# Patient Record
Sex: Male | Born: 1940 | Race: White | Hispanic: No | Smoking: Former smoker
Health system: Southern US, Community
[De-identification: ages and names within clinical notes are randomized; demographics above are authoritative.]

## PROBLEM LIST (undated history)

## (undated) DIAGNOSIS — R451 Restlessness and agitation: Secondary | ICD-10-CM

## (undated) DIAGNOSIS — E785 Hyperlipidemia, unspecified: Secondary | ICD-10-CM

## (undated) DIAGNOSIS — K759 Inflammatory liver disease, unspecified: Secondary | ICD-10-CM

## (undated) DIAGNOSIS — K449 Diaphragmatic hernia without obstruction or gangrene: Secondary | ICD-10-CM

## (undated) DIAGNOSIS — I4891 Unspecified atrial fibrillation: Secondary | ICD-10-CM

## (undated) DIAGNOSIS — R942 Abnormal results of pulmonary function studies: Secondary | ICD-10-CM

## (undated) DIAGNOSIS — I10 Essential (primary) hypertension: Secondary | ICD-10-CM

## (undated) DIAGNOSIS — R0600 Dyspnea, unspecified: Secondary | ICD-10-CM

## (undated) DIAGNOSIS — R918 Other nonspecific abnormal finding of lung field: Secondary | ICD-10-CM

## (undated) DIAGNOSIS — J8409 Other alveolar and parieto-alveolar conditions: Secondary | ICD-10-CM

## (undated) DIAGNOSIS — Z8619 Personal history of other infectious and parasitic diseases: Secondary | ICD-10-CM

## (undated) DIAGNOSIS — I2510 Atherosclerotic heart disease of native coronary artery without angina pectoris: Secondary | ICD-10-CM

## (undated) DIAGNOSIS — I639 Cerebral infarction, unspecified: Secondary | ICD-10-CM

## (undated) DIAGNOSIS — R569 Unspecified convulsions: Secondary | ICD-10-CM

## (undated) HISTORY — DX: Cerebral infarction, unspecified: I63.9

## (undated) HISTORY — DX: Restlessness and agitation: R45.1

## (undated) HISTORY — DX: Diaphragmatic hernia without obstruction or gangrene: K44.9

## (undated) HISTORY — DX: Personal history of other infectious and parasitic diseases: Z86.19

## (undated) HISTORY — DX: Dyspnea, unspecified: R06.00

## (undated) HISTORY — DX: Essential (primary) hypertension: I10

## (undated) HISTORY — DX: Unspecified atrial fibrillation: I48.91

## (undated) HISTORY — DX: Other alveolar and parieto-alveolar conditions: J84.09

## (undated) HISTORY — DX: Unspecified convulsions: R56.9

## (undated) HISTORY — DX: Other nonspecific abnormal finding of lung field: R91.8

## (undated) HISTORY — DX: Atherosclerotic heart disease of native coronary artery without angina pectoris: I25.10

## (undated) HISTORY — DX: Abnormal results of pulmonary function studies: R94.2

## (undated) HISTORY — DX: Inflammatory liver disease, unspecified: K75.9

## (undated) HISTORY — DX: Hyperlipidemia, unspecified: E78.5

## (undated) HISTORY — PX: APPENDECTOMY: SHX54

---

## 2005-07-06 DIAGNOSIS — I639 Cerebral infarction, unspecified: Secondary | ICD-10-CM

## 2005-07-06 HISTORY — DX: Cerebral infarction, unspecified: I63.9

## 2006-07-04 ENCOUNTER — Inpatient Hospital Stay (HOSPITAL_COMMUNITY): Admission: EM | Admit: 2006-07-04 | Discharge: 2006-07-12 | Payer: Self-pay | Admitting: Emergency Medicine

## 2006-07-04 ENCOUNTER — Ambulatory Visit: Payer: Self-pay | Admitting: Cardiology

## 2006-07-05 ENCOUNTER — Encounter (INDEPENDENT_AMBULATORY_CARE_PROVIDER_SITE_OTHER): Payer: Self-pay | Admitting: Neurology

## 2006-07-05 ENCOUNTER — Encounter: Payer: Self-pay | Admitting: Cardiology

## 2006-07-07 ENCOUNTER — Ambulatory Visit: Payer: Self-pay | Admitting: Physical Medicine & Rehabilitation

## 2006-07-07 ENCOUNTER — Encounter: Payer: Self-pay | Admitting: Cardiology

## 2006-07-12 ENCOUNTER — Inpatient Hospital Stay (HOSPITAL_COMMUNITY)
Admission: RE | Admit: 2006-07-12 | Discharge: 2006-07-29 | Payer: Self-pay | Admitting: Physical Medicine & Rehabilitation

## 2006-08-19 ENCOUNTER — Ambulatory Visit: Payer: Self-pay | Admitting: Cardiology

## 2006-08-26 ENCOUNTER — Ambulatory Visit: Payer: Self-pay

## 2006-09-01 ENCOUNTER — Encounter: Payer: Self-pay | Admitting: Physical Medicine & Rehabilitation

## 2006-09-03 ENCOUNTER — Encounter
Admission: RE | Admit: 2006-09-03 | Discharge: 2006-12-02 | Payer: Self-pay | Admitting: Physical Medicine & Rehabilitation

## 2006-09-03 ENCOUNTER — Ambulatory Visit: Payer: Self-pay | Admitting: Physical Medicine & Rehabilitation

## 2006-09-04 ENCOUNTER — Encounter: Payer: Self-pay | Admitting: Physical Medicine & Rehabilitation

## 2006-10-05 ENCOUNTER — Encounter: Payer: Self-pay | Admitting: Physical Medicine & Rehabilitation

## 2006-10-07 ENCOUNTER — Ambulatory Visit: Payer: Self-pay | Admitting: Cardiology

## 2006-10-31 ENCOUNTER — Emergency Department (HOSPITAL_COMMUNITY): Admission: EM | Admit: 2006-10-31 | Discharge: 2006-10-31 | Payer: Self-pay | Admitting: *Deleted

## 2006-11-02 ENCOUNTER — Encounter: Payer: Self-pay | Admitting: Cardiology

## 2006-11-02 ENCOUNTER — Ambulatory Visit: Payer: Self-pay | Admitting: Cardiology

## 2006-11-04 ENCOUNTER — Encounter: Payer: Self-pay | Admitting: Physical Medicine & Rehabilitation

## 2006-12-03 ENCOUNTER — Encounter
Admission: RE | Admit: 2006-12-03 | Discharge: 2007-03-03 | Payer: Self-pay | Admitting: Physical Medicine & Rehabilitation

## 2006-12-03 ENCOUNTER — Ambulatory Visit: Payer: Self-pay | Admitting: Physical Medicine & Rehabilitation

## 2006-12-05 ENCOUNTER — Encounter: Payer: Self-pay | Admitting: Physical Medicine & Rehabilitation

## 2007-01-04 ENCOUNTER — Encounter: Payer: Self-pay | Admitting: Physical Medicine & Rehabilitation

## 2007-01-05 ENCOUNTER — Emergency Department: Payer: Self-pay | Admitting: Emergency Medicine

## 2007-02-04 ENCOUNTER — Encounter: Payer: Self-pay | Admitting: Physical Medicine & Rehabilitation

## 2007-02-22 ENCOUNTER — Emergency Department (HOSPITAL_COMMUNITY): Admission: EM | Admit: 2007-02-22 | Discharge: 2007-02-22 | Payer: Self-pay | Admitting: Emergency Medicine

## 2007-03-04 ENCOUNTER — Encounter
Admission: RE | Admit: 2007-03-04 | Discharge: 2007-03-08 | Payer: Self-pay | Admitting: Physical Medicine & Rehabilitation

## 2007-03-04 ENCOUNTER — Ambulatory Visit: Payer: Self-pay | Admitting: Physical Medicine & Rehabilitation

## 2007-03-07 ENCOUNTER — Encounter: Payer: Self-pay | Admitting: Physical Medicine & Rehabilitation

## 2007-04-06 ENCOUNTER — Encounter: Payer: Self-pay | Admitting: Physical Medicine & Rehabilitation

## 2007-04-13 ENCOUNTER — Observation Stay (HOSPITAL_COMMUNITY): Admission: EM | Admit: 2007-04-13 | Discharge: 2007-04-14 | Payer: Self-pay | Admitting: Emergency Medicine

## 2007-04-13 ENCOUNTER — Ambulatory Visit: Payer: Self-pay | Admitting: Cardiovascular Disease

## 2007-05-06 ENCOUNTER — Ambulatory Visit: Payer: Self-pay | Admitting: Cardiology

## 2007-08-23 ENCOUNTER — Encounter
Admission: RE | Admit: 2007-08-23 | Discharge: 2007-08-24 | Payer: Self-pay | Admitting: Physical Medicine & Rehabilitation

## 2007-08-23 ENCOUNTER — Ambulatory Visit: Payer: Self-pay | Admitting: Physical Medicine & Rehabilitation

## 2007-09-28 ENCOUNTER — Emergency Department (HOSPITAL_COMMUNITY): Admission: EM | Admit: 2007-09-28 | Discharge: 2007-09-28 | Payer: Self-pay | Admitting: Emergency Medicine

## 2007-12-23 ENCOUNTER — Ambulatory Visit: Payer: Self-pay | Admitting: Cardiology

## 2008-06-30 ENCOUNTER — Emergency Department (HOSPITAL_COMMUNITY): Admission: EM | Admit: 2008-06-30 | Discharge: 2008-07-01 | Payer: Self-pay | Admitting: Emergency Medicine

## 2008-07-14 ENCOUNTER — Emergency Department: Payer: Medicare Other | Admitting: Emergency Medicine

## 2008-08-30 ENCOUNTER — Emergency Department (HOSPITAL_COMMUNITY): Admission: EM | Admit: 2008-08-30 | Discharge: 2008-08-30 | Payer: Self-pay | Admitting: Emergency Medicine

## 2008-09-03 DIAGNOSIS — R918 Other nonspecific abnormal finding of lung field: Secondary | ICD-10-CM

## 2008-09-03 DIAGNOSIS — I2510 Atherosclerotic heart disease of native coronary artery without angina pectoris: Secondary | ICD-10-CM

## 2008-09-03 DIAGNOSIS — R0600 Dyspnea, unspecified: Secondary | ICD-10-CM

## 2008-09-03 HISTORY — PX: OTHER SURGICAL HISTORY: SHX169

## 2008-09-03 HISTORY — DX: Atherosclerotic heart disease of native coronary artery without angina pectoris: I25.10

## 2008-09-03 HISTORY — DX: Dyspnea, unspecified: R06.00

## 2008-09-03 HISTORY — DX: Other nonspecific abnormal finding of lung field: R91.8

## 2008-09-09 ENCOUNTER — Emergency Department (HOSPITAL_COMMUNITY): Admission: EM | Admit: 2008-09-09 | Discharge: 2008-09-10 | Payer: Self-pay | Admitting: Emergency Medicine

## 2008-09-10 ENCOUNTER — Encounter: Payer: Self-pay | Admitting: Cardiology

## 2008-09-10 ENCOUNTER — Encounter: Payer: Self-pay | Admitting: Family Medicine

## 2008-09-10 DIAGNOSIS — I4891 Unspecified atrial fibrillation: Secondary | ICD-10-CM | POA: Insufficient documentation

## 2008-09-10 DIAGNOSIS — I10 Essential (primary) hypertension: Secondary | ICD-10-CM

## 2008-09-10 DIAGNOSIS — I6350 Cerebral infarction due to unspecified occlusion or stenosis of unspecified cerebral artery: Secondary | ICD-10-CM

## 2008-09-11 ENCOUNTER — Encounter: Payer: Self-pay | Admitting: Cardiology

## 2008-09-11 ENCOUNTER — Ambulatory Visit: Payer: Self-pay | Admitting: Cardiology

## 2008-09-11 DIAGNOSIS — R0602 Shortness of breath: Secondary | ICD-10-CM

## 2008-09-11 DIAGNOSIS — E7800 Pure hypercholesterolemia, unspecified: Secondary | ICD-10-CM

## 2008-09-11 LAB — CONVERTED CEMR LAB
CO2: 32 meq/L (ref 19–32)
Chloride: 99 meq/L (ref 96–112)
GFR calc non Af Amer: 54 mL/min
Potassium: 3.6 meq/L (ref 3.5–5.1)
Pro B Natriuretic peptide (BNP): 35 pg/mL (ref 0.0–100.0)

## 2008-09-18 ENCOUNTER — Ambulatory Visit: Payer: Self-pay

## 2008-09-18 ENCOUNTER — Encounter: Payer: Self-pay | Admitting: Cardiology

## 2008-09-24 ENCOUNTER — Ambulatory Visit: Payer: Self-pay | Admitting: Pulmonary Disease

## 2008-09-24 ENCOUNTER — Ambulatory Visit: Payer: Self-pay | Admitting: Internal Medicine

## 2008-09-24 ENCOUNTER — Inpatient Hospital Stay (HOSPITAL_COMMUNITY): Admission: EM | Admit: 2008-09-24 | Discharge: 2008-09-29 | Payer: Self-pay | Admitting: Emergency Medicine

## 2008-09-25 DIAGNOSIS — R079 Chest pain, unspecified: Secondary | ICD-10-CM | POA: Insufficient documentation

## 2008-10-01 ENCOUNTER — Ambulatory Visit: Payer: Self-pay | Admitting: Cardiovascular Disease

## 2008-10-08 ENCOUNTER — Ambulatory Visit: Payer: Self-pay | Admitting: Internal Medicine

## 2008-10-23 ENCOUNTER — Encounter: Payer: Self-pay | Admitting: Cardiology

## 2008-10-23 ENCOUNTER — Ambulatory Visit: Payer: Self-pay | Admitting: Cardiology

## 2008-10-23 LAB — CONVERTED CEMR LAB
Albumin: 3.6 g/dL (ref 3.5–5.2)
Alkaline Phosphatase: 75 units/L (ref 39–117)
Chloride: 105 meq/L (ref 96–112)
Cholesterol: 132 mg/dL (ref 0–200)
HDL: 30.3 mg/dL — ABNORMAL LOW (ref >39.00)
LDL Cholesterol: 76 mg/dL (ref 0–99)
Potassium: 4.1 meq/L (ref 3.5–5.1)
Sodium: 141 meq/L (ref 135–145)
Total Protein: 7.1 g/dL (ref 6.0–8.3)
Triglycerides: 127 mg/dL (ref 0.0–149.0)
VLDL: 25.4 mg/dL (ref 0.0–40.0)

## 2008-10-30 ENCOUNTER — Encounter: Payer: Self-pay | Admitting: Cardiovascular Disease

## 2008-10-30 ENCOUNTER — Ambulatory Visit: Payer: Self-pay

## 2008-11-22 ENCOUNTER — Encounter: Payer: Self-pay | Admitting: Cardiology

## 2008-12-04 DIAGNOSIS — R569 Unspecified convulsions: Secondary | ICD-10-CM

## 2008-12-04 HISTORY — DX: Unspecified convulsions: R56.9

## 2009-01-01 ENCOUNTER — Emergency Department (HOSPITAL_COMMUNITY): Admission: EM | Admit: 2009-01-01 | Discharge: 2009-01-01 | Payer: Self-pay | Admitting: Emergency Medicine

## 2009-01-03 ENCOUNTER — Emergency Department (HOSPITAL_COMMUNITY): Admission: EM | Admit: 2009-01-03 | Discharge: 2009-01-04 | Payer: Self-pay | Admitting: Emergency Medicine

## 2009-01-08 ENCOUNTER — Emergency Department (HOSPITAL_COMMUNITY): Admission: EM | Admit: 2009-01-08 | Discharge: 2009-01-08 | Payer: Self-pay | Admitting: Emergency Medicine

## 2009-01-17 ENCOUNTER — Encounter: Payer: Self-pay | Admitting: Cardiology

## 2009-01-31 ENCOUNTER — Encounter: Payer: Self-pay | Admitting: Cardiology

## 2009-02-13 ENCOUNTER — Encounter (INDEPENDENT_AMBULATORY_CARE_PROVIDER_SITE_OTHER): Payer: Self-pay | Admitting: *Deleted

## 2009-02-14 ENCOUNTER — Encounter: Payer: Self-pay | Admitting: Cardiology

## 2009-02-14 ENCOUNTER — Encounter: Payer: Self-pay | Admitting: Family Medicine

## 2009-02-18 ENCOUNTER — Encounter: Payer: Self-pay | Admitting: *Deleted

## 2009-03-14 ENCOUNTER — Encounter: Payer: Self-pay | Admitting: Cardiology

## 2009-04-11 ENCOUNTER — Encounter: Payer: Self-pay | Admitting: Cardiology

## 2009-04-29 ENCOUNTER — Encounter: Payer: Self-pay | Admitting: Cardiology

## 2009-04-30 ENCOUNTER — Ambulatory Visit: Payer: Self-pay

## 2009-04-30 ENCOUNTER — Encounter (INDEPENDENT_AMBULATORY_CARE_PROVIDER_SITE_OTHER): Payer: Self-pay | Admitting: *Deleted

## 2009-04-30 ENCOUNTER — Ambulatory Visit: Payer: Self-pay | Admitting: Cardiology

## 2009-04-30 DIAGNOSIS — I2510 Atherosclerotic heart disease of native coronary artery without angina pectoris: Secondary | ICD-10-CM | POA: Insufficient documentation

## 2009-04-30 DIAGNOSIS — R930 Abnormal findings on diagnostic imaging of skull and head, not elsewhere classified: Secondary | ICD-10-CM

## 2009-05-01 ENCOUNTER — Encounter (INDEPENDENT_AMBULATORY_CARE_PROVIDER_SITE_OTHER): Payer: Self-pay | Admitting: *Deleted

## 2009-05-01 LAB — CONVERTED CEMR LAB
AST: 13 units/L (ref 0–37)
Chloride: 100 meq/L (ref 96–112)
Cholesterol: 162 mg/dL (ref 0–200)
Creatinine, Ser: 1.4 mg/dL (ref 0.4–1.5)
LDL Cholesterol: 95 mg/dL (ref 0–99)
Pro B Natriuretic peptide (BNP): 42 pg/mL (ref 0.0–100.0)
Sodium: 141 meq/L (ref 135–145)
Total Bilirubin: 0.7 mg/dL (ref 0.3–1.2)
Triglycerides: 148 mg/dL (ref 0.0–149.0)
VLDL: 29.6 mg/dL (ref 0.0–40.0)

## 2009-05-02 ENCOUNTER — Emergency Department: Payer: Medicare Other | Admitting: Emergency Medicine

## 2009-05-08 ENCOUNTER — Ambulatory Visit: Payer: Self-pay | Admitting: Cardiology

## 2009-05-08 DIAGNOSIS — E876 Hypokalemia: Secondary | ICD-10-CM | POA: Insufficient documentation

## 2009-05-09 LAB — CONVERTED CEMR LAB
BUN: 17 mg/dL (ref 6–23)
Creatinine, Ser: 1.5 mg/dL (ref 0.4–1.5)
GFR calc non Af Amer: 49.38 mL/min (ref >60)
Potassium: 3.9 meq/L (ref 3.5–5.1)

## 2009-05-10 ENCOUNTER — Encounter: Payer: Self-pay | Admitting: Cardiology

## 2009-05-28 ENCOUNTER — Ambulatory Visit: Payer: Self-pay | Admitting: Internal Medicine

## 2009-05-28 DIAGNOSIS — J984 Other disorders of lung: Secondary | ICD-10-CM | POA: Insufficient documentation

## 2009-06-13 ENCOUNTER — Encounter: Payer: Self-pay | Admitting: Cardiology

## 2009-06-14 ENCOUNTER — Encounter (INDEPENDENT_AMBULATORY_CARE_PROVIDER_SITE_OTHER): Payer: Self-pay | Admitting: *Deleted

## 2009-06-14 ENCOUNTER — Ambulatory Visit: Payer: Self-pay | Admitting: Cardiology

## 2009-06-14 ENCOUNTER — Ambulatory Visit: Payer: Self-pay | Admitting: Internal Medicine

## 2009-06-14 DIAGNOSIS — E785 Hyperlipidemia, unspecified: Secondary | ICD-10-CM | POA: Insufficient documentation

## 2009-06-14 DIAGNOSIS — R942 Abnormal results of pulmonary function studies: Secondary | ICD-10-CM

## 2009-06-14 DIAGNOSIS — J8409 Other alveolar and parieto-alveolar conditions: Secondary | ICD-10-CM | POA: Insufficient documentation

## 2009-06-14 LAB — CONVERTED CEMR LAB
ALT: 15 units/L (ref 0–53)
AST: 19 units/L (ref 0–37)
Albumin: 3.7 g/dL (ref 3.5–5.2)
Calcium: 8.8 mg/dL (ref 8.4–10.5)
Cholesterol: 125 mg/dL (ref 0–200)
GFR calc non Af Amer: 63.87 mL/min (ref >60)
HDL: 33.9 mg/dL — ABNORMAL LOW (ref >39.00)
Sodium: 139 meq/L (ref 135–145)
Triglycerides: 143 mg/dL (ref 0.0–149.0)

## 2009-07-04 ENCOUNTER — Encounter: Payer: Self-pay | Admitting: Cardiology

## 2009-07-11 ENCOUNTER — Encounter: Payer: Self-pay | Admitting: Cardiology

## 2009-07-18 ENCOUNTER — Encounter: Payer: Self-pay | Admitting: Cardiology

## 2009-07-24 ENCOUNTER — Telehealth (INDEPENDENT_AMBULATORY_CARE_PROVIDER_SITE_OTHER): Payer: Self-pay | Admitting: *Deleted

## 2009-08-01 ENCOUNTER — Encounter (INDEPENDENT_AMBULATORY_CARE_PROVIDER_SITE_OTHER): Payer: Self-pay | Admitting: Cardiology

## 2009-08-08 ENCOUNTER — Encounter (INDEPENDENT_AMBULATORY_CARE_PROVIDER_SITE_OTHER): Payer: Self-pay | Admitting: Cardiology

## 2009-08-09 ENCOUNTER — Telehealth: Payer: Self-pay | Admitting: Cardiology

## 2009-08-15 ENCOUNTER — Encounter: Payer: Self-pay | Admitting: Cardiology

## 2009-08-20 ENCOUNTER — Encounter: Payer: Self-pay | Admitting: Internal Medicine

## 2009-08-20 LAB — CONVERTED CEMR LAB: POC INR: 2.84

## 2009-08-21 ENCOUNTER — Encounter: Payer: Self-pay | Admitting: Cardiology

## 2009-08-21 ENCOUNTER — Encounter (INDEPENDENT_AMBULATORY_CARE_PROVIDER_SITE_OTHER): Payer: Self-pay | Admitting: Cardiology

## 2009-08-23 ENCOUNTER — Ambulatory Visit (HOSPITAL_COMMUNITY): Admission: RE | Admit: 2009-08-23 | Discharge: 2009-08-23 | Payer: Self-pay | Admitting: Cardiology

## 2009-08-23 ENCOUNTER — Ambulatory Visit: Payer: Self-pay | Admitting: Cardiology

## 2009-08-29 ENCOUNTER — Encounter: Payer: Self-pay | Admitting: Cardiology

## 2009-09-03 ENCOUNTER — Ambulatory Visit: Payer: Self-pay | Admitting: Cardiology

## 2009-09-26 ENCOUNTER — Encounter: Payer: Self-pay | Admitting: Cardiology

## 2009-09-27 ENCOUNTER — Encounter: Payer: Self-pay | Admitting: Family Medicine

## 2009-10-22 ENCOUNTER — Ambulatory Visit: Payer: Self-pay | Admitting: Cardiovascular Disease

## 2009-10-22 ENCOUNTER — Ambulatory Visit: Payer: Self-pay | Admitting: Cardiology

## 2009-10-23 ENCOUNTER — Ambulatory Visit: Payer: Self-pay | Admitting: Cardiology

## 2009-10-23 ENCOUNTER — Ambulatory Visit (HOSPITAL_COMMUNITY): Admission: RE | Admit: 2009-10-23 | Discharge: 2009-10-23 | Payer: Self-pay | Admitting: Cardiology

## 2009-10-29 ENCOUNTER — Ambulatory Visit: Payer: Self-pay | Admitting: Internal Medicine

## 2009-11-21 ENCOUNTER — Ambulatory Visit: Payer: Self-pay | Admitting: Internal Medicine

## 2009-11-21 ENCOUNTER — Encounter: Payer: Self-pay | Admitting: Internal Medicine

## 2009-11-21 ENCOUNTER — Inpatient Hospital Stay (HOSPITAL_COMMUNITY): Admission: EM | Admit: 2009-11-21 | Discharge: 2009-11-28 | Payer: Self-pay | Admitting: Emergency Medicine

## 2009-11-25 ENCOUNTER — Ambulatory Visit: Payer: Self-pay | Admitting: Physical Medicine & Rehabilitation

## 2009-11-27 ENCOUNTER — Encounter: Payer: Self-pay | Admitting: Internal Medicine

## 2009-11-29 ENCOUNTER — Telehealth: Payer: Self-pay | Admitting: Cardiology

## 2009-12-25 ENCOUNTER — Ambulatory Visit: Payer: Self-pay | Admitting: Cardiology

## 2010-01-02 ENCOUNTER — Emergency Department: Payer: Self-pay | Admitting: Emergency Medicine

## 2010-01-16 ENCOUNTER — Encounter: Payer: Self-pay | Admitting: Cardiology

## 2010-01-16 ENCOUNTER — Encounter: Payer: Self-pay | Admitting: Family Medicine

## 2010-01-17 ENCOUNTER — Encounter: Payer: Self-pay | Admitting: Cardiology

## 2010-02-28 ENCOUNTER — Ambulatory Visit: Payer: Self-pay | Admitting: Cardiology

## 2010-03-31 ENCOUNTER — Ambulatory Visit: Payer: Self-pay | Admitting: Internal Medicine

## 2010-04-08 ENCOUNTER — Ambulatory Visit: Payer: Self-pay | Admitting: Family Medicine

## 2010-04-08 DIAGNOSIS — K759 Inflammatory liver disease, unspecified: Secondary | ICD-10-CM | POA: Insufficient documentation

## 2010-04-08 DIAGNOSIS — Z87448 Personal history of other diseases of urinary system: Secondary | ICD-10-CM

## 2010-04-08 DIAGNOSIS — R569 Unspecified convulsions: Secondary | ICD-10-CM

## 2010-04-08 DIAGNOSIS — Z9189 Other specified personal risk factors, not elsewhere classified: Secondary | ICD-10-CM | POA: Insufficient documentation

## 2010-04-08 DIAGNOSIS — F329 Major depressive disorder, single episode, unspecified: Secondary | ICD-10-CM

## 2010-04-08 DIAGNOSIS — Z8679 Personal history of other diseases of the circulatory system: Secondary | ICD-10-CM | POA: Insufficient documentation

## 2010-04-10 ENCOUNTER — Telehealth: Payer: Self-pay | Admitting: Family Medicine

## 2010-04-14 ENCOUNTER — Ambulatory Visit: Payer: Medicare Other | Admitting: Internal Medicine

## 2010-04-14 ENCOUNTER — Telehealth: Payer: Self-pay | Admitting: Family Medicine

## 2010-04-16 ENCOUNTER — Telehealth: Payer: Self-pay | Admitting: Family Medicine

## 2010-04-17 ENCOUNTER — Encounter: Payer: Self-pay | Admitting: Family Medicine

## 2010-04-21 ENCOUNTER — Encounter: Payer: Self-pay | Admitting: Family Medicine

## 2010-04-28 ENCOUNTER — Encounter: Payer: Self-pay | Admitting: Family Medicine

## 2010-05-05 ENCOUNTER — Encounter: Payer: Self-pay | Admitting: Family Medicine

## 2010-05-14 ENCOUNTER — Encounter: Payer: Self-pay | Admitting: Family Medicine

## 2010-05-14 ENCOUNTER — Telehealth: Payer: Self-pay | Admitting: Family Medicine

## 2010-05-14 LAB — CONVERTED CEMR LAB
INR: 3.5
Prothrombin Time: 35.2 s

## 2010-05-15 ENCOUNTER — Telehealth: Payer: Self-pay | Admitting: Cardiology

## 2010-05-15 ENCOUNTER — Encounter: Payer: Self-pay | Admitting: Family Medicine

## 2010-05-17 ENCOUNTER — Encounter: Payer: Self-pay | Admitting: Family Medicine

## 2010-05-21 ENCOUNTER — Encounter: Payer: Self-pay | Admitting: Family Medicine

## 2010-05-21 ENCOUNTER — Telehealth: Payer: Self-pay | Admitting: Family Medicine

## 2010-05-23 ENCOUNTER — Telehealth: Payer: Self-pay | Admitting: Family Medicine

## 2010-05-23 ENCOUNTER — Encounter: Payer: Self-pay | Admitting: Internal Medicine

## 2010-05-26 ENCOUNTER — Ambulatory Visit: Payer: Self-pay | Admitting: Family Medicine

## 2010-05-27 LAB — CONVERTED CEMR LAB
ALT: 13 units/L (ref 0–53)
AST: 17 units/L (ref 0–37)
Alkaline Phosphatase: 99 units/L (ref 39–117)
Basophils Absolute: 0 10*3/uL (ref 0.0–0.1)
Basophils Relative: 0.3 % (ref 0.0–3.0)
Bilirubin, Direct: 0.1 mg/dL (ref 0.0–0.3)
CO2: 31 meq/L (ref 19–32)
Carbamazepine Lvl: 9.2 ug/mL (ref 4.0–12.0)
Chloride: 103 meq/L (ref 96–112)
Creatinine, Ser: 1.5 mg/dL (ref 0.4–1.5)
Hemoglobin: 13.7 g/dL (ref 13.0–17.0)
LDL Cholesterol: 68 mg/dL (ref 0–99)
Lymphocytes Relative: 22.7 % (ref 12.0–46.0)
Monocytes Relative: 10.6 % (ref 3.0–12.0)
Neutro Abs: 4.2 10*3/uL (ref 1.4–7.7)
Neutrophils Relative %: 65.4 % (ref 43.0–77.0)
Potassium: 4.1 meq/L (ref 3.5–5.1)
RBC: 3.95 M/uL — ABNORMAL LOW (ref 4.22–5.81)
RDW: 15 % — ABNORMAL HIGH (ref 11.5–14.6)
TSH: 7.08 microintl units/mL — ABNORMAL HIGH (ref 0.35–5.50)
Total CHOL/HDL Ratio: 3
Total Protein: 6.3 g/dL (ref 6.0–8.3)
Triglycerides: 104 mg/dL (ref 0.0–149.0)

## 2010-05-28 ENCOUNTER — Telehealth: Payer: Self-pay | Admitting: Family Medicine

## 2010-05-28 ENCOUNTER — Encounter: Payer: Self-pay | Admitting: Family Medicine

## 2010-05-28 LAB — CONVERTED CEMR LAB
INR: 2.4
Prothrombin Time: 23.6 s

## 2010-05-29 ENCOUNTER — Emergency Department: Payer: Medicare Other | Admitting: Emergency Medicine

## 2010-06-02 ENCOUNTER — Encounter: Payer: Self-pay | Admitting: Family Medicine

## 2010-06-10 ENCOUNTER — Encounter: Payer: Self-pay | Admitting: Family Medicine

## 2010-06-11 ENCOUNTER — Telehealth: Payer: Self-pay | Admitting: Family Medicine

## 2010-06-12 ENCOUNTER — Ambulatory Visit: Payer: Medicare Other | Admitting: Family Medicine

## 2010-06-12 ENCOUNTER — Encounter: Payer: Self-pay | Admitting: Family Medicine

## 2010-06-12 ENCOUNTER — Ambulatory Visit: Payer: Self-pay | Admitting: Family Medicine

## 2010-06-12 DIAGNOSIS — I679 Cerebrovascular disease, unspecified: Secondary | ICD-10-CM

## 2010-06-13 ENCOUNTER — Telehealth: Payer: Self-pay | Admitting: Family Medicine

## 2010-06-13 ENCOUNTER — Encounter: Payer: Self-pay | Admitting: Family Medicine

## 2010-06-13 LAB — CONVERTED CEMR LAB
ALT: <8 units/L (ref 0–53)
AST: 13 units/L (ref 0–37)
Basophils Absolute: 0 10*3/uL (ref 0.0–0.1)
Basophils Relative: 0 % (ref 0–1)
CO2: 28 meq/L (ref 19–32)
Calcium: 8.8 mg/dL (ref 8.4–10.5)
Chloride: 100 meq/L (ref 96–112)
Lymphocytes Relative: 19 % (ref 12–46)
MCHC: 34.4 g/dL (ref 30.0–36.0)
Neutro Abs: 4.7 10*3/uL (ref 1.7–7.7)
Neutrophils Relative %: 69 % (ref 43–77)
Platelets: 322 10*3/uL (ref 150–400)
RDW: 14 % (ref 11.5–15.5)
Sodium: 140 meq/L (ref 135–145)
Total Protein: 6.9 g/dL (ref 6.0–8.3)

## 2010-06-19 ENCOUNTER — Ambulatory Visit: Payer: Self-pay | Admitting: Cardiology

## 2010-06-19 ENCOUNTER — Encounter: Payer: Self-pay | Admitting: Cardiology

## 2010-06-24 ENCOUNTER — Telehealth: Payer: Self-pay | Admitting: Family Medicine

## 2010-06-25 ENCOUNTER — Encounter (INDEPENDENT_AMBULATORY_CARE_PROVIDER_SITE_OTHER): Payer: Self-pay | Admitting: *Deleted

## 2010-07-02 ENCOUNTER — Encounter: Payer: Self-pay | Admitting: Family Medicine

## 2010-07-04 ENCOUNTER — Telehealth: Payer: Self-pay | Admitting: Family Medicine

## 2010-07-09 ENCOUNTER — Encounter: Payer: Self-pay | Admitting: Family Medicine

## 2010-07-17 ENCOUNTER — Telehealth: Payer: Self-pay | Admitting: Family Medicine

## 2010-07-18 ENCOUNTER — Encounter: Payer: Self-pay | Admitting: Family Medicine

## 2010-07-18 ENCOUNTER — Telehealth: Payer: Self-pay | Admitting: Family Medicine

## 2010-07-21 ENCOUNTER — Encounter: Payer: Self-pay | Admitting: Family Medicine

## 2010-07-23 ENCOUNTER — Encounter: Payer: Self-pay | Admitting: Family Medicine

## 2010-07-23 ENCOUNTER — Telehealth: Payer: Self-pay | Admitting: Family Medicine

## 2010-07-26 ENCOUNTER — Encounter: Payer: Self-pay | Admitting: Family Medicine

## 2010-07-28 ENCOUNTER — Encounter: Payer: Self-pay | Admitting: Family Medicine

## 2010-07-29 ENCOUNTER — Encounter: Payer: Self-pay | Admitting: Family Medicine

## 2010-07-31 ENCOUNTER — Telehealth: Payer: Self-pay | Admitting: Family Medicine

## 2010-07-31 ENCOUNTER — Ambulatory Visit
Admission: RE | Admit: 2010-07-31 | Discharge: 2010-07-31 | Payer: Self-pay | Source: Home / Self Care | Attending: Family Medicine | Admitting: Family Medicine

## 2010-07-31 ENCOUNTER — Emergency Department: Payer: Medicare Other

## 2010-07-31 DIAGNOSIS — L89309 Pressure ulcer of unspecified buttock, unspecified stage: Secondary | ICD-10-CM | POA: Insufficient documentation

## 2010-08-01 ENCOUNTER — Telehealth: Payer: Self-pay | Admitting: Family Medicine

## 2010-08-04 ENCOUNTER — Encounter: Payer: Self-pay | Admitting: Family Medicine

## 2010-08-05 NOTE — Progress Notes (Signed)
Summary: PT,INR  Phone Note Other Incoming Call back at [phone]   Caller: Pam with [facility] Summary of Call: Paitents INR was 28.5, Protime  2.8. He takes 5mg  of coumadin every day. Pam needs to know if there should be any changes in med and when the next Protime should be checked.  Initial call taken by: Melody Comas,  April 16, 2010 9:14 AM  Follow-up for Phone Call        no changes in meds, recheck in 4 weeks.  Enter coumadin dose into EMR Follow-up by: Crawford Givens MD,  April 16, 2010 10:24 AM  Additional Follow-up for Phone Call Additional follow up Details #1::        pam advised.Consuello Masse CMA   Additional Follow-up by: Benny Lennert CMA Duncan Dull),  April 16, 2010 2:40 PM    New/Updated Medications: WARFARIN SODIUM 5 MG TABS (WARFARIN SODIUM) patient takes 5mg  daily  Prior Medications: ASPIRIN 81 MG TBEC (ASPIRIN) Take one tablet by mouth daily LORAZEPAM 0.5 MG TABS (LORAZEPAM) Every 6 hours as needed VITAMIN D3 1000 IU QD () 1 tab by mouth once daily FISH OIL 1000 MG CAPS (OMEGA-3 FATTY ACIDS) Take 1 capsule by mouth once a day CRESTOR 20 MG TABS (ROSUVASTATIN CALCIUM) 1 EVERY DAY CARBATROL 100 MG XR12H-CAP (CARBAMAZEPINE) take three tablets twice daily FUROSEMIDE 20 MG TABS (FUROSEMIDE) Take one tablet by mouth daily. AMIODARONE HCL 200 MG TABS (AMIODARONE HCL) 1 once daily LEXAPRO 10 MG TABS (ESCITALOPRAM OXALATE) Take 1/2 tablet by mouth once a day SUPEREPA 2,000 - 1,000 MG. () Take 1 tablet by mouth once a day Current Allergies: No known allergies

## 2010-08-05 NOTE — Miscellaneous (Signed)
Summary: Order/[facility]  Order/[facility]   Imported By: Sherian Rein 05/06/2010 09:39:03  _____________________________________________________________________  External Attachment:    Type:   Image     Comment:   External Document

## 2010-08-05 NOTE — Progress Notes (Signed)
  Phone Note Outgoing Call   Call placed by: Levonne Spiller EMT-P,  June 13, 2010 12:06 PM Request: Send information Reason for Call: Discuss lab or test results Summary of Call: Pt's Daughter was contacted by leaving a message on her service. Message was told to contact the clinic to discuss office visit results.

## 2010-08-05 NOTE — Assessment & Plan Note (Signed)
Summary: POSSIBLE UTI/EVM   Vital Signs:  Patient Profile:   70 Years Old Male CC:      Possible UTI Height:     68 inches Weight:      210 pounds BMI:     32.05 O2 Sat:      97 % O2 treatment:    Room Air Temp:     97.4 degrees F oral Pulse rate:   70 / minute Pulse rhythm:   regular Resp:     18 per minute BP sitting:   123 / 74  (right arm)  Pt. in pain?   yes    Location:   back    Intensity:   3    Type:       aching  Vitals Entered By: Levonne Spiller EMT-P (June 12, 2010 4:54 PM)              Is Patient Diabetic? No  Does patient need assistance? Functional Status Cook/clean, Shopping, Social activities Ambulation Impaired:Risk for fall      Current Allergies: No known allergies History of Present Illness History from: daughter Reason for visit: see chief complaint Chief Complaint: Possible UTI History of Present Illness: The patient is presenting today with his daughter because she thinks that he may have a UTI because his urine has a strong odor.   She also says that patient has been complaining of flank pain.  He has some cerebrovascular disease and vascular dementia.  He has had no fever, chills or vomiting.  He has been constipated and his daughter gave him 3 enemas and laxatives and it seemed to clean him off because he had several large bowel movements.  Now he is having some loose stools.  He has been complaining of some abdominal discomfort as well. The patient hasn't been drinking a lot of water.  He tells [place] that he is not able to give [place] a urine sample.  We gave him a bottle of water and waited with him for 25 mins but he was still not able to give a urine sample for [place] today.    REVIEW OF SYSTEMS Constitutional Symptoms       Complains of fatigue.     Denies fever, chills, night sweats, weight loss, and weight gain.  Eyes       Complains of change in vision and glasses.      Denies eye pain, eye discharge, contact lenses, and eye  surgery. Ear/Nose/Throat/Mouth       Complains of hearing loss/aids.      Denies change in hearing, ear pain, ear discharge, dizziness, frequent runny nose, frequent nose bleeds, sinus problems, sore throat, hoarseness, and tooth pain or bleeding.  Respiratory       Denies dry cough, productive cough, wheezing, shortness of breath, asthma, bronchitis, and emphysema/COPD.  Cardiovascular       Complains of tires easily with exhertion and edema.      Denies murmurs, chest pain, and edema.    Gastrointestinal       Complains of stomach pain, diarrhea, and constipation.      Denies nausea/vomiting, blood in bowel movements, and indigestion. Genitourniary       Denies painful urination, kidney stones, and loss of urinary control. Neurological       Denies headaches, loss of or changes in sensation, numbness, tngling, tremors, and fainting/blackouts. Musculoskeletal       Complains of decreased range of motion and muscle weakness.  Denies muscle pain, joint pain, joint stiffness, redness, swelling, and gout.  Skin       Denies bruising, unusual mles/lumps or sores, and hair/skin or nail changes.  Psych       Complains of mood changes, temper/anger issues, anxiety/stress, and speech problems.      Denies depression and sleep problems.  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   2 children Tobacco Use - Yes. Smoked pipe-quit 2007, used pipe x 25yrs , started in 1980 Alcohol Use - no Regular Exercise - no ambulation with cane or walker Has been disabled since his stroke.  Risk  Factors: Smoking Status: quit (03/31/2010) Pipe Use/wk: 14 (03/31/2010)  Past Medical History: Reviewed history from 06/08/2010 and no changes required. #HYPERCHOLESTEROLEMIA, PURE (ICD-272.0) #HYPERTENSION (ICD-401.9) #COUMADIN THERAPY (ICD-V58.61) #Atrial Fibrillation .Marland KitchenMarland KitchenMarland KitchenDr Doreatha Lew  - s/p cardioversion 08/23/2009  - s/p amiodarone Rx since 09/03/2009 #CVA in the setting of Atrial Fib in 2007 #Hiatal Hernia #CAD ...........Marland KitchenDr Jens Som  - s/p Bare Metal stent to LAD March 2010  - Normal LV function on Cath March 2010 and normal BNP Oc 2010 #New onset Seizures June 2010.Marland KitchenMarland KitchenMarland KitchenDr Sharene Skeans [facility] NODULES  -Non specific reticulonodular nodules RLL superior segment March 2010  - REsolved on CT 06/14/09   - No further followup #Unspec alveolar pneumonoapathy  - 06/14/09 CT CHEst: Mild LLL GGO   - 02/28/2010 CT chest: improved #HX OF TOBACCO ABUSE #DYSPNEA - Class 3  - Since 2008.  Significant tachycardia with exertion to 75 feet March 2010  -  No PE or Fibrosis on CT chest 3/10, 12/10 and 02/2010  - Normal PFT but isolated low DLCO -- dec 2010  - Normal hgb, tsh, creat and albumin - dec 2010  - REsolved after A Fib cardioversion and amiodarone March 2011  -Seizure disorder UTI'S, HX OF (ICD-V13.00) CEREBROVASCULAR ACCIDENT, HX OF (ICD-V12.50) SEIZURE DISORDER (ICD-780.39)- Dr. 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 Appendectomy Stent placed in  09/2008  Family History: Reviewed history from 05-08-2010 and no changes required. Father:deceased in 60's from CVA Mother:alive, HLD, HTN, OA Family History Hypertension, parents Family History of Stroke F 1st degree relative <60, parents Family History of Heart Disease, other blood relative  Social History: Reviewed history from 08-May-2010 and no changes required. 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   2 children Tobacco Use - Yes. Smoked pipe-quit 2007, used pipe x 19yrs , started in 1980 Alcohol Use - no Regular Exercise - no ambulation with cane or walker Has been disabled since his stroke. Physical Exam General appearance: elderly male, no acute distress, cooperative, sitting in wheelchair, gets agitated at times Head: normocephalic, atraumatic Eyes: conjunctivae and lids normal Pupils: equal, round, reactive to light Ears: normal, no lesions  or deformities Nasal: mucosa pink, nonedematous, no septal deviation, turbinates normal Oral/Pharynx: dry mucus membranes Neck: neck supple,  trachea midline, no masses Chest/Lungs: no rales, wheezes, or rhonchi bilateral, breath sounds equal without effort Heart: Irregular rate and rhythm Abdomen: soft, non-tender without obvious organomegaly Extremities: contractures of the right upper extremities, right lower extremities Neurological: grossly intact and non-focal Skin: no obvious rashes or lesions MSE: oriented to time, place, and person Assessment  Assessed ATRIAL FIBRILLATION as unchanged - Standley Dakins MD Assessed CVA as unchanged - Clanford Johnson MD Assessed COUMADIN THERAPY as unchanged - Standley Dakins MD Assessed HYPERTENSION as improved - Standley Dakins MD New Problems: CEREBROVASCULAR DISEASE (ICD-437.9) ? of URINARY TRACT INFECTION, ACUTE, RECURRENT (ICD-599.0) CONSTIPATION, CHRONIC (ICD-564.09) ABDOMINAL  PAIN, UNSPECIFIED SITE (ICD-789.00)   Patient Education: Patient and/or caregiver instructed in the following: rest, fluids. The risks, benefits and possible side effects were clearly explained and discussed with the patient and daughter.  They verbalized clear understanding.  The patient daughter was given instructions to return if symptoms don't improve, worsen or new changes develop.  If it is not during clinic hours and the patient cannot get back to this clinic then the patient was told to seek medical care at an available urgent care or emergency department.  The patient daughter verbalized understanding.   Demonstrates willingness to comply.  Plan Planning Comments:   The patient was not able to give a urine sample today.  I gave them a sterile cup, instructions to bring urine sample in the morning so we can have it tested/cultured as appropriate.  Also, gave patient and daughter orders to go to [facility] to get Xrays of the KUB and to have the results faxed to the office here.  They went directly over there after the visit.  Also, ordered labs including a CBC/diff, CMP.   Follow Up: Follow up in 2-3 days if no improvement, Follow up on an as needed basis, Follow up with Primary Physician  The patient and/or caregiver has been counseled thoroughly with regard to medications prescribed including dosage, schedule, interactions, rationale for use, and possible side effects and they verbalize understanding.  Diagnoses and expected course of recovery discussed and will return if not improved as expected or if the condition worsens. Patient and/or caregiver verbalized understanding.   Patient Instructions: 1)  Go to [facility] to get you xrays done. 2)  Oral Rehydration Solution: drink 1/2 ounce every 15 minutes. If tolerated afert 1 hour, drink 1 ounce every 15 minutes. As you can tolerate, keep adding 1/2 ounce every 15 minutes, up to a total of 2-4 ounces. Contact the office if unable to tolerate oral  solution, if you keep vomiting, or you continue to have signs of dehydration. 3)  The patient was informed that there is no on-call provider or services available at this clinic during off-hours (when the clinic is closed).  If the patient developed a problem or concern that required immediate attention, the patient was advised to go the the nearest available urgent care or emergency department for medical care.  The patient verbalized understanding.    4)  Please bring the urine sample back tomorrow morning.  Keep the sample refrigerated and then bring to [place] so we can evaluate it and call in some antibiotics as needed.

## 2010-08-05 NOTE — Progress Notes (Signed)
Summary: needs something for pain   Phone Note Call from Patient Call back at [phone]   Caller: Patient Call For: Crawford Givens MD Summary of Call: Daughter called stating that her dad fell over the weekend and now hurt his hand. She says that he has a nasty skin tear and it is now causing him alot of pain. He has tried taking tylenol for the pain, but is not helping. She is asking if it would be possible for something to be called in to help with pain. I let her know that this could possibly require an office visit.  Initial call taken by: Melody Comas,  April 14, 2010 1:58 PM  Follow-up for Phone Call        he needs an OV, either here or at [facility].  From the sounds of it, the wound needs to be checked.  Pain could be addressed at that point.  Follow-up by: Crawford Givens MD,  April 14, 2010 2:08 PM  Additional Follow-up for Phone Call Additional follow up Details #1::        Spoke with daughter and she will call back for appt.   Additional Follow-up by: Melody Comas,  April 14, 2010 2:42 PM

## 2010-08-05 NOTE — Miscellaneous (Signed)
Summary: Physician Verbal Order/[facility]  Physician Verbal Order/[facility]    Imported By: Maryln Gottron 05/30/2010 13:55:01  _____________________________________________________________________  External Attachment:    Type:   Image     Comment:   External Document

## 2010-08-05 NOTE — Miscellaneous (Signed)
Summary: Certification and Plan of Care/[facility]  Certification and Plan of Care/[facility] [place]   Imported By: Maryln Gottron 04/25/2010 10:25:46  _____________________________________________________________________  External Attachment:    Type:   Image     Comment:   External Document

## 2010-08-05 NOTE — Miscellaneous (Signed)
Summary: Physician Verbal Orders/[facility]  Physician Verbal Orders/[facility] [place]   Imported By: Maryln Gottron 04/25/2010 10:23:57  _____________________________________________________________________  External Attachment:    Type:   Image     Comment:   External Document

## 2010-08-05 NOTE — Initial Assessments (Signed)
INTERNAL MEDICINE ADMISSION HISTORY AND PHYSICAL  First Contact: Dr. Scot Dock [phone] Second Contact:Dr. Gwenlyn Perking [phone]  XBJ:YNWGNFAO Family Medicine   ZH:YQMV sided lower extremity weakness  HPI: Mr. 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 The patient is accompanied by his wife and daughter who provide most of the history, due to patient's aphasia from his previous stroke. The patient's wife reports that they first noticed his weakness when pt got out of the car at approximately 2 pm. The had difficulty maintaing his balance, and states his legs felt "wobbly." He also mentions that at this time he also felt dizzy. The pt did not fall because his wife and son were there to hold him up. The patient's family was concerned that he had another stroke and thus brought him to the ED. Pt had a previous stroke in 2007, from which he has residual right sided weakness. The patient's wife notes that the patient has frequent falls, including 4-5 falls in the past couple of months. At baseline, pt has aphasia, unable to use right arm, lost vision from right eye, and has significant weakness in his right lower extremity. The patient denies chest pain, shortness of breath increased from baseline, headache, abdominal pain, nausea, vomiting, changes in bowel habits and urinary symptoms. The patient's wife denies any changes to speech per baseline.   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  superior segment March 2010   MEDICATIONS:  WARFARIN SODIUM 5 MG TABS (WARFARIN SODIUM) Use as directed by Anticoagulation Clinic ASPIRIN 81 MG TBEC (ASPIRIN) Take one tablet by mouth daily KLONOPIN 0.5 MG TABS (CLONAZEPAM) 1/2- 1 once daily * VITAMIN D3 1000 IU QD 1 tab by mouth once daily FUROSEMIDE 40 MG TABS (FUROSEMIDE) 1 tab by mouth once daily FISH OIL 1000 MG CAPS (OMEGA-3 FATTY ACIDS) Take 1 capsule by mouth once a day TEGRETOL 200 MG TABS (CARBAMAZEPINE) take 1 1/2 tablet two times a day AMIODARONE HCL 200 MG TABS (AMIODARONE HCL) Take one tablet by mouth two times a day KLOR-CON 20 MEQ PACK (POTASSIUM CHLORIDE) 1 once daily CRESTOR 20 MG TABS (ROSUVASTATIN CALCIUM) 1 EVERY DAY METOPROLOL SUCCINATE 50 MG XL (METOPROLOL SUCCINATE) Take one tab once daily     SOCIAL HISTORY: Married to 6th wife Divorced first wife and 2nd-4th died Married to current wife for 17 years 2 children from former wife Pipe smoker Rarely drinks alcohol Retired Education administrator  FAMILY HISTORY  Father:deceased in 28's from CVA Mother:alive age 58   ROS:As per HPI  VITALS: T:97.7  P: 78 BP:127/69  R:18  O2SAT: 96% ON:RA  PHYSICAL EXAM: General:  alert, well-developed, and cooperative to examination.   Head:  normocephalic and atraumatic.   Eyes:  vision grossly intact, pupils equal, pupils round, pupils reactive to light, no injection and anicteric.   Mouth:  pharynx pink and moist, no erythema, and no exudates.   Neck:  supple, full ROM, no thyromegaly, no JVD, and no carotid bruits.   Lungs:  normal respiratory effort,  no accessory muscle use, normal breath sounds, no crackles, and no wheezes.  Heart:  normal rate, irregular rhythm, no murmur, no gallop, and no rub.   Abdomen:  soft, non-tender, normal bowel sounds, no distention, no guarding, no rebound tenderness, no hepatomegaly, and no splenomegaly.   Msk:  no joint swelling, no joint warmth, and no redness over joints.   Pulses:  2+ DP/PT  pulses bilaterally Extremities:  No cyanosis, clubbing, edema  Neurologic:  alert & oriented X3, cranial nerves II-XII intact, strength 4/5 left lower extremity, sensation intact to light touch on left side, no sensation on right side, unable to assess strength right upper extremity  LABS:   WBC                                      9.8               4.0-10.5         K/uL  RBC                                      3.98       l      4.22-5.81        MIL/uL  Hemoglobin (HGB)                         13.3              13.0-17.0        g/dL  Hematocrit (HCT)                         38.8       l      39.0-52.0        %  MCV                                      97.6              78.0-100.0       fL  MCHC                                     34.3              30.0-36.0        g/dL  RDW                                      16.0       h      11.5-15.5        %  Platelet Count (PLT)                     192               150-400          K/uL  Neutrophils, %  77                43-77            %  Lymphocytes, %                           14                12-46            %  Monocytes, %                             9                 3-12             %  Eosinophils, %                           0                 0-5              %  Basophils, %                             0                 0-1              %  Neutrophils, Absolute                    7.5               1.7-7.7          K/uL  Lymphocytes, Absolute                    1.3               0.7-4.0          K/uL  Monocytes, Absolute                      0.9               0.1-1.0          K/uL  Eosinophils, Absolute                    0.0               0.0-0.7          K/uL  Basophils, Absolute                      0.0               0.0-0.1          K/uL    Sodium (NA)                              137               135-145          mEq/L  Potassium (K)  3.5               3.5-5.1          mEq/L   Chloride                                 102               96-112           mEq/L  CO2                                      27                19-32            mEq/L  Glucose                                  125        h      70-99            mg/dL  BUN                                      13                6-23             mg/dL  Creatinine                               1.45              0.4-1.5          mg/dL  GFR, Est Non African American            48         l      >60              mL/min  GFR, Est African American                58         l      >60              mL/min    Oversized comment, see footnote  1  Bilirubin, Total                         0.3               0.3-1.2          mg/dL  Alkaline Phosphatase                     94                39-117           U/L  SGOT (AST)                               20  0-37             U/L  SGPT (ALT)                               18                0-53             U/L  Total  Protein                           6.7               6.0-8.3          g/dL  Albumin-Blood                            3.6               3.5-5.2          g/dL  Calcium                                  8.5               8.4-10.5         mg/dL   Color, Urine                             YELLOW            YELLOW  Appearance                               CLEAR             CLEAR  Specific Gravity                         1.014             1.005-1.030  pH                                       5.5               5.0-8.0  Urine Glucose                            NEGATIVE          NEG              mg/dL  Bilirubin                                NEGATIVE          NEG  Ketones                                  NEGATIVE          NEG              mg/dL  Blood  NEGATIVE          NEG  Protein                                  NEGATIVE          NEG              mg/dL  Urobilinogen                             1.0               0.0-1.0           mg/dL  Nitrite                                  NEGATIVE          NEG  Leukocytes                               SMALL      a      NEG    Casts / HPF                              SEE NOTE.  a      NEG    HYALINE CASTS  WBC / HPF                                0-2               <3               WBC/hpf  RBC / HPF                                0-2               <3               RBC/hpf  Bacteria / HPF                           RARE              RARE  Urine-Other                              SEE NOTE.    MUCOUS PRESENT  Protime ( Prothrombin Time)              24.6       h      11.6-15.2        seconds INR                                      2.24       h      0.00-1.49  Comparison: CT 01/08/2009    Findings: Chronic left PCA infarct involving the left posterior   temporal and occipital lobe.  This  is unchanged.  No acute infarct   is present.  There is mild chronic ischemia in the left internal   capsule and periventricular white matter which is unchanged.  There   is no hemorrhage or mass lesion.  Ventricle size is normal.   Calvarium is intact.   CXR: IMPRESSION:   Low lung volumes.  Mild cardiomegaly.    ASSESSMENT AND PLAN:  1) Left sided weakness: He has left-sided weakness more predominant in left lower extremity. He has intact sensation, however there is slight increased left sided weakness. There is concern for TIA or stroke vs conversion disorder. Pt is already on ASA and Coumadin, therefore there is concern for failed therapy causing stroke. As discussed with neurology (cur[facility]-side) starting patient on higher dose of ASA along with coumadin, increases risk of bleed, therefore, only other option is plavix with coumadin which has same efficacy as coumadin and aspirin. Pt's wife notes increased level of anxiety and stress that may contributing to patient's symptoms. In patient with hx of previous stroke, paroxysmal a-fib on coumadin, stroke needs to be ruled out. Pt was outside of  3 hour window, thus patient was not code stroke. Pt's 2D Echo from 09/2008 reveals EF of 55-60%, with mild diastolic dysfunction, no evidence of PFO, or any other abnormalities.   Plan: -- We will admit to Tele and closely monitor vitals -- Continue ASA and coumadin and hold her HTN meds -- PT/OT consult  -- Will pursue with further work up including MRI/MRA, carotid dopplers -- Will discuss with cardiology if coumadin needs to be continued given the frequency of falls patient has  -- Will also continue patient on statin   2) Paroxysmal A-fib - Pt has been cardio converted twice in the past, and is currently on chronic coumadin therapy. INR is at goal. Plan: -- continue coumadin per Rx, as mentioned in #1, will d/w cardiology  --check daily PT/INR  3) HTN - Will resume pt's metoprolol, but will hold Lasix.   4) HLD - Last lipid panel wnl 06/2009. Will continue pt on Crestor at home dose.   5) DEPRESSION/ANXIETY:Continue home medications (Klonipin)    Attending Physician: I performed and/or observed a history and physical examination of the patient.  I discussed the case with the residents as noted and reviewed the residents' notes.  I agree with the findings and plan--please refer to the attending physician note for more details.  Signature  Printed Name

## 2010-08-05 NOTE — Assessment & Plan Note (Signed)
Summary: f77m/dm   Referring Provider:  Dr. Jens Som Primary Provider:  Dr. Timoteo Gaul, [facility] in [place]   History of Present Illness: [name] returns for followup today.  He has a history of atrial fibrillation that is paroxysmal.  He also had a previous stroke related to this and is on chronic Coumadin. Last echocardiogram was performed on September 18, 2008. LV function was normal. Trivial aortic insufficiency and mild left atrial enlargement. He underwent cardiac catheterization on September 26, 2008.  There was a 90% stenosis in the LAD proper.  There was also an 80% stenosis in the distal LAD.  There was no other significant disease noted.  His ejection fraction was 55%.  The patient subsequently underwent PCI of the mid LAD lesion with a bare-metal stent.  There was a CT scan performed on September 10, 2008, that showed no pulmonary embolus.  There were reticular nodular opacities in the superior segment of the right lower lobe felt possibly secondary to an infectious or inflammatory process.  Pulmonary is now following this. He underwent cardioversion recently after beginning amiodarone. This was performed on August 23, 2009. Since then he has occasional episodes of dyspnea but this does not appear to be significantly problematic. There is no chest pain, palpitations or syncope. He did fall recently in his bathroom after losing his balance. He developed some bruising over his right shoulder and right groin.   Current Medications (verified): 1)  Warfarin Sodium 5 Mg Tabs (Warfarin Sodium) .... Use As Directed By Anticoagulation Clinic 2)  Metoprolol Succinate 100 Mg Xr24h-Tab (Metoprolol Succinate) .... Take One Tablet By Mouth Daily 3)  Aspirin 81 Mg Tbec (Aspirin) .... Take One Tablet By Mouth Daily 4)  Klonopin 0.5 Mg Tabs (Clonazepam) .... 1/2- 1 Once Daily 5)  Vitamin D3 1000 Iu Qd .Marland Kitchen.. 1 Tab By Mouth Once Daily 6)  Furosemide 40 Mg Tabs (Furosemide) .... 1/2 Tab By Mouth Once  Daily 7)  Fish Oil 1000 Mg Caps (Omega-3 Fatty Acids) .... Take 1 Capsule By Mouth Once A Day 8)  Tegretol 200 Mg Tabs (Carbamazepine) .... Take 1 1/2 Tablet Two Times A Day 9)  Amiodarone Hcl 200 Mg Tabs (Amiodarone Hcl) .... Take One Tablet By Mouth Twice A Day For 30 Days Then Decrease To One Tablet Once Daily 10)  Klor-Con 20 Meq Pack (Potassium Chloride) .Marland Kitchen.. 1 Once Daily 11)  Crestor 20 Mg Tabs (Rosuvastatin Calcium) .Marland Kitchen.. 1 Every Day 12)  Carbamazepine 200 Mg Tabs (Carbamazepine) .Marland Kitchen.. 1 1/2 Tab By Mouth Two Times A Day  Allergies: No Known Drug Allergies  Past History:  Past Medical History: Reviewed history from 06/14/2009 and no changes required. 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 Appendectomy Stent placed in 09/2008  Social History: Reviewed history from 05/28/2009 and no changes required. 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  He was   divorced from  his first wife and the second through fourth had died.  He   has been married to his current wife for about 17 years.  There are 2   children from his former marriage.  He is a pipe smoker.   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   The patient has given up his pipe.  He does not drink alcohol.  He does   not use drugs.  Review of Systems       Some pain of her right shoulder from recent fall but no fevers or chills, productive cough, hemoptysis, dysphasia, odynophagia, melena, hematochezia, dysuria, hematuria, rash, seizure activity, orthopnea, PND, pedal edema, claudication. Remaining systems are negative.   Vital Signs:  Patient profile:   70 year old male Height:      68 inches Weight:      206 pounds BMI:     31.44 Pulse rate:   108 / minute Resp:     18 per minute BP sitting:   118 / 70  (left arm)  Vitals Entered By: Kem Parkinson (September 03, 2009 9:56 AM)  Physical Exam  General:  Well-developed well-nourished in no acute distress.  Skin is warm and dry.  there is ecchymosis over the right chest and right lower groin. HEENT is normal.  Neck is supple. No thyromegaly. At the right neck area there is a small 2 x 2 centimeter hard mass palpated. Chest is clear to auscultation with normal expansion.  Cardiovascular exam is irregular.  Abdominal exam nontender or distended. No masses palpated. Extremities show no edema. neuro residual deficits from prior CVA.    Impression & Recommendations:  Problem # 1:  ATRIAL FIBRILLATION (ICD-427.31) The patient has returned to atrial fibrillation. I will increase his amiodarone to 200 mg p.o. b.i.d. for 30 days. We will reattempt cardioversion after more of this is in his system. Hopefully this will help him hold sinus rhythm in the future. Note he will need to continue on Coumadin for life given prior CVA. He did fall recently which is unusual. I discussed the importance of being careful particularly in light of Coumadin use. There is a small hard mass in his right upper neck and I have asked him to see his primary care physician for this. It does not feel like hematoma. Check TSH. His updated medication list for this problem includes:    Warfarin Sodium 5 Mg  Tabs (Warfarin sodium) ..... Use as directed by anticoagulation clinic    Metoprolol Succinate 100 Mg Xr24h-tab (Metoprolol succinate) .Marland Kitchen... Take one tablet by mouth daily    Aspirin 81 Mg Tbec (Aspirin) .Marland Kitchen... Take one tablet by mouth daily    Amiodarone Hcl 200 Mg Tabs (Amiodarone hcl) .Marland Kitchen... Take one tablet by mouth two times a day  Orders: TLB-TSH (Thyroid Stimulating Hormone) (84443-TSH)  Problem # 2:  CVA (ICD-434.91)  His updated medication list for this problem includes:    Warfarin Sodium 5 Mg Tabs (Warfarin sodium) ..... Use as directed by anticoagulation clinic    Aspirin 81 Mg Tbec (Aspirin) .Marland Kitchen... Take one tablet by mouth daily  Problem # 3:  COUMADIN THERAPY (ICD-V58.61) Followed by his primary care physician.  Problem # 4:  HYPERTENSION (ICD-401.9) Blood pressure controlled on present medications. Will continue. His updated medication list for this problem includes:  Metoprolol Succinate 100 Mg Xr24h-tab (Metoprolol succinate) .Marland Kitchen... Take one tablet by mouth daily    Aspirin 81 Mg Tbec (Aspirin) .Marland Kitchen... Take one tablet by mouth daily    Furosemide 40 Mg Tabs (Furosemide) .Marland Kitchen... 1/2 tab by mouth once daily  Problem # 5:  HYPERCHOLESTEROLEMIA, PURE (ICD-272.0) Continue statin. His updated medication list for this problem includes:    Crestor 20 Mg Tabs (Rosuvastatin calcium) .Marland Kitchen... 1 every day  Problem # 6:  CAD (ICD-414.00) Continue aspirin, beta blocker and statin. His updated medication list for this problem includes:    Warfarin Sodium 5 Mg Tabs (Warfarin sodium) ..... Use as directed by anticoagulation clinic    Metoprolol Succinate 100 Mg Xr24h-tab (Metoprolol succinate) .Marland Kitchen... Take one tablet by mouth daily    Aspirin 81 Mg Tbec (Aspirin) .Marland Kitchen... Take one tablet by mouth daily  Problem # 7:  COMPUTERIZED TOMOGRAPHY, CHEST, ABNORMAL (ICD-793.1) Management per pulmonary.  Patient Instructions: 1)  Your physician recommends that you schedule a follow-up appointment in:  6 weeks 2)  Your physician has recommended you make the following change in your medication: INCREASE AMIODARONE 200MG  TWICE DAILY Prescriptions: AMIODARONE HCL 200 MG TABS (AMIODARONE HCL) Take one tablet by mouth two times a day  #60 x 12   Entered by:   Deliah Goody, RN   Authorized by:   Ferman Hamming, MD, [facility]   Signed by:   Deliah Goody, RN on 09/03/2009   Method used:   Electronically to        [facility]. [address] [phone]* (retail)       [address] [place], [place]  [id number]       Ph: [phone]       Fax: [phone]   RxID:   [id number]

## 2010-08-05 NOTE — Miscellaneous (Signed)
Summary: SN Orders/[facility]  SN Orders/[facility]   Imported By: Lanelle Bal 06/05/2010 14:01:17  _____________________________________________________________________  External Attachment:    Type:   Image     Comment:   External Document

## 2010-08-05 NOTE — Progress Notes (Signed)
Summary: protime results  Phone Note From Other Clinic   Caller: Pam, nurse with care [facility] [phone] Summary of Call: Home health nurse called to report pt's INR of 2.4, PT 23.6.  He is taking 5 mg's of coumadin daily except for sunday when he takes 2.5 mg's.  She is asking for any changes to his dose, when to recheck. Initial call taken by: Lowella Petties CMA, AAMA,  May 28, 2010 12:56 PM  Follow-up for Phone Call        no change in meds.  recheck in 4 weeks. thanks.  Follow-up by: Crawford Givens MD,  May 28, 2010 1:53 PM  Additional Follow-up for Phone Call Additional follow up Details #1::        Pam, Nurse, advised.  Added to flowsheet.  Additional Follow-up by: Delilah Shan CMA Duncan Dull),  May 28, 2010 2:35 PM

## 2010-08-05 NOTE — Progress Notes (Signed)
Summary: refill request for tegretol  Phone Note Refill Request Message from:  Fax from Pharmacy  Refills Requested: Medication #1:  CARBATROL 100 MG XR12H-CAP take three tablets twice daily   Last Refilled: 05/23/2010 Faxed request from [facility], request is for 200 mg's, one and one half twice a day.  Initial call taken by: Lowella Petties CMA, AAMA,  May 23, 2010 2:28 PM  Follow-up for Phone Call        sent.  I LMOVM for daughter to call back if needed.  I talked with pharmacy and the transferred rx was for plain (not ext release) carbamazepine 200mg , 1.5 by mouth two times a day.  The EMR rx was ammended and rx was sent.   Follow-up by: Crawford Givens MD,  May 23, 2010 5:34 PM    New/Updated Medications: CARBAMAZEPINE 200 MG TABS (CARBAMAZEPINE) 1.5 tab by mouth bid Prescriptions: CARBAMAZEPINE 200 MG TABS (CARBAMAZEPINE) 1.5 tab by mouth bid  #60 x 12   Entered and Authorized by:   Crawford Givens MD   Signed by:   Crawford Givens MD on 05/23/2010   Method used:   Electronically to        [facility]. # [id number]* (retail)       [address]       [place], [place]  [address]       Ph: [phone]       Fax: [phone]   RxID:   [id number]

## 2010-08-05 NOTE — Consult Note (Signed)
Summary: [facility]  [facility]   Imported By: Lanelle Bal 06/06/2010 13:46:16  _____________________________________________________________________  External Attachment:    Type:   Image     Comment:   External Document  Appended Document: [facility]    Clinical Lists Changes  Observations: Added new observation of PAST MED HX: #HYPERCHOLESTEROLEMIA, PURE (ICD-272.0) #HYPERTENSION (ICD-401.9) #COUMADIN THERAPY (ICD-V58.61) #Atrial Fibrillation .Marland KitchenMarland KitchenMarland KitchenDr Doreatha Lew  - s/p cardioversion 08/23/2009  - s/p amiodarone Rx since 09/03/2009 #CVA in the setting of Atrial Fib in 2007 #Hiatal Hernia #CAD ...........Marland KitchenDr Jens Som  - s/p Bare Metal stent to LAD March 2010  - Normal LV function on Cath March 2010 and normal BNP Oc 2010 #New onset Seizures June 2010.Marland KitchenMarland KitchenMarland KitchenDr Sharene Skeans [facility] NODULES  -Non specific reticulonodular nodules RLL superior segment March 2010  - REsolved on CT 06/14/09   - No further followup #Unspec alveolar pneumonoapathy  - 06/14/09 CT CHEst: Mild LLL GGO   - 02/28/2010 CT chest: improved #HX OF TOBACCO ABUSE #DYSPNEA - Class 3  - Since 2008.  Significant tachycardia with exertion to 75 feet March 2010  -  No PE or Fibrosis on CT chest 3/10, 12/10 and 02/2010  - Normal PFT but isolated low DLCO -- dec 2010  - Normal hgb, tsh, creat and albumin - dec 2010  - REsolved after A Fib cardioversion and amiodarone March 2011  -Seizure disorder UTI'S, HX OF (ICD-V13.00) CEREBROVASCULAR ACCIDENT, HX OF (ICD-V12.50) SEIZURE DISORDER (ICD-780.39)- Dr. 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) SHORTNESS OF BREATH (ICD-786.05) HYPOPOTASSEMIA (ICD-276.8) ENCOUNTER FOR LONG-TERM USE OF OTHER MEDICATIONS (ICD-V58.69) CHEST PAIN  (ICD-786.50) COUMADIN THERAPY (ICD-V58.61) ATRIAL FIBRILLATION (ICD-427.31) h/o agitation and irritability, situational exacerbation (06/08/2010 22:33)       Past History:  Past Medical History: #HYPERCHOLESTEROLEMIA, PURE (ICD-272.0) #HYPERTENSION (ICD-401.9) #COUMADIN THERAPY (ICD-V58.61) #Atrial Fibrillation .Marland KitchenMarland KitchenMarland KitchenDr Doreatha Lew  - s/p cardioversion 08/23/2009  - s/p amiodarone Rx since 09/03/2009 #CVA in the setting of Atrial Fib in 2007 #Hiatal Hernia #CAD ...........Marland KitchenDr Jens Som  - s/p Bare Metal stent to LAD March 2010  - Normal LV function on Cath March 2010 and normal BNP Oc 2010 #New onset Seizures June 2010.Marland KitchenMarland KitchenMarland KitchenDr Sharene Skeans [facility] NODULES  -Non specific reticulonodular nodules RLL superior segment March 2010  - REsolved on CT 06/14/09   - No further followup #Unspec alveolar pneumonoapathy  - 06/14/09 CT CHEst: Mild LLL GGO   - 02/28/2010 CT chest: improved #HX OF TOBACCO ABUSE #DYSPNEA - Class 3  - Since 2008.  Significant tachycardia with exertion to 75 feet March 2010  -  No PE or Fibrosis on CT chest 3/10, 12/10 and 02/2010  - Normal PFT but isolated low DLCO -- dec 2010  - Normal hgb, tsh, creat and albumin - dec 2010  - REsolved after A Fib cardioversion and amiodarone March 2011  -Seizure disorder UTI'S, HX OF (ICD-V13.00) CEREBROVASCULAR ACCIDENT, HX OF (ICD-V12.50) SEIZURE DISORDER (ICD-780.39)- Dr. 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

## 2010-08-05 NOTE — Miscellaneous (Signed)
Summary: [facility] Verbal [facility]  Physician Verbal Order/[facility]   Imported By: Maryln Gottron 04/25/2010 10:46:10  _____________________________________________________________________  External Attachment:    Type:   Image     Comment:   External Document

## 2010-08-05 NOTE — Progress Notes (Signed)
Summary: reporting protime results  Phone Note From Other Clinic   Caller: [facility] with [facility] [phone] Summary of Call: Home health nurse called to report pt's INR of 3.5, PT of 35.2.  He is taking 5 mg's of coumadin every day.  Nurse is asking when do you want her to recheck. Initial call taken by: Lowella Petties CMA, AAMA,  May 14, 2010 2:56 PM  Follow-up for Phone Call        coumadin 2.5mg  on Sunday, 5mg  on other days of the week.  Recheck INR in 2 weeks.  please enter dose change in EMR.  Follow-up by: Crawford Givens MD,  May 14, 2010 3:32 PM  Additional Follow-up for Phone Call Additional follow up Details #1::        Nurse advised by VM.  Asked her to return call to confirm receipt of message.  Dose changed in EMR. Added to flowsheet. Lugene Fuquay CMA (AAMA)  May 14, 2010 4:08 PM     New/Updated Medications: WARFARIN SODIUM 5 MG TABS (WARFARIN SODIUM) patient takes 5mg  daily except 2.5 mg. on Sunday

## 2010-08-05 NOTE — Miscellaneous (Signed)
Summary: Physician Verbal Order/[facility]  Physician Verbal Order/[facility]   Imported By: Maryln Gottron 05/12/2010 12:45:51  _____________________________________________________________________  External Attachment:    Type:   Image     Comment:   External Document

## 2010-08-05 NOTE — Progress Notes (Signed)
  Phone Note Outgoing Call   Call placed by: Deliah Goody, RN,  July 24, 2009 5:27 PM Summary of Call: pt has had theraputic protimes and per dr Jens Som, pt to stop digoxin and start amiodarone. after 2 more weeks of theraputic protimes after starting amiodarone  the pt will be scheduled for a cardioversion. spoke with pts wife, she is aware of the changes.    New/Updated Medications: AMIODARONE HCL 200 MG TABS (AMIODARONE HCL) Take one tablet by mouth twice a day for 30 days then decrease to one tablet once daily Prescriptions: AMIODARONE HCL 200 MG TABS (AMIODARONE HCL) Take one tablet by mouth twice a day for 30 days then decrease to one tablet once daily  #60 x 12   Entered by:   Deliah Goody, RN   Authorized by:   Ferman Hamming, MD, [facility]   Signed by:   Deliah Goody, RN on 07/24/2009   Method used:   Electronically to        [facility]. [address] [phone]* (retail)       [address] [place], [place]  [address]       Ph: [phone]       Fax: [phone]   RxID:   [id number]

## 2010-08-05 NOTE — Miscellaneous (Signed)
  Clinical Lists Changes  Pt was notified of current lab results. Als was advised to start eating potassium rich foods or some potassium tabs could be called into his Rx. Pt. daughter stated she wanted to talk to Dr. Para March and have him review the lab results first.

## 2010-08-05 NOTE — Letter (Signed)
Summary: [facility]   Imported By: Lanelle Bal 05/26/2010 08:13:30  _____________________________________________________________________  External Attachment:    Type:   Image     Comment:   External Document

## 2010-08-05 NOTE — Miscellaneous (Signed)
Summary: [facility]  [facility]   Imported By: Sherian Rein 06/10/2010 08:02:00  _____________________________________________________________________  External Attachment:    Type:   Image     Comment:   External Document

## 2010-08-05 NOTE — Assessment & Plan Note (Signed)
Summary: F6W/DM   Visit Type:  Follow-up Referring Provider:  Dr. Jens Som Primary Provider:  Dr. Timoteo Gaul, [facility] in [place]  CC:  pt loses his vision  and fluid in foot ankle (RT) numbness.  History of Present Illness: [name] returns for followup today.  He has a history of atrial fibrillation that is paroxysmal.  He also had a previous stroke related to this and is on chronic Coumadin. Last echocardiogram was performed on September 18, 2008. LV function was normal. Trivial aortic insufficiency and mild left atrial enlargement. He underwent cardiac catheterization on September 26, 2008.  There was a 90% stenosis in the LAD proper.  There was also an 80% stenosis in the distal LAD.  There was no other significant disease noted.  His ejection fraction was 55%.  The patient subsequently underwent PCI of the mid LAD lesion with a bare-metal stent.  There was a CT scan performed on September 10, 2008, that showed no pulmonary embolus.  There were reticular nodular opacities in the superior segment of the right lower lobe felt possibly secondary to an infectious or inflammatory process.  Pulmonary is now following this. He underwent cardioversion recently after beginning amiodarone. This was performed on August 23, 2009. I last saw him in March of this year. He had recurrent atrial fibrillation and we increased his amiodarone. Since then he does note some dyspnea on exertion relieved with rest. There is no associated chest pain. There is no orthopnea or PND he does have mild pedal edema in the right lower extremity by his report. He does not have chest pain, palpitations, syncope or bleeding.   Current Medications (verified): 1)  Warfarin Sodium 5 Mg Tabs (Warfarin Sodium) .... Use As Directed By Anticoagulation Clinic 2)  Metoprolol Succinate 100 Mg Xr24h-Tab (Metoprolol Succinate) .... Take One Tablet By Mouth Daily 3)  Aspirin 81 Mg Tbec (Aspirin) .... Take One Tablet By Mouth Daily 4)   Klonopin 0.5 Mg Tabs (Clonazepam) .... 1/2- 1 Once Daily 5)  Vitamin D3 1000 Iu Qd .Marland Kitchen.. 1 Tab By Mouth Once Daily 6)  Furosemide 40 Mg Tabs (Furosemide) .Marland Kitchen.. 1 Tab By Mouth Once Daily 7)  Fish Oil 1000 Mg Caps (Omega-3 Fatty Acids) .... Take 1 Capsule By Mouth Once A Day 8)  Tegretol 200 Mg Tabs (Carbamazepine) .... Take 1 1/2 Tablet Two Times A Day 9)  Amiodarone Hcl 200 Mg Tabs (Amiodarone Hcl) .... Take One Tablet By Mouth Two Times A Day 10)  Klor-Con 20 Meq Pack (Potassium Chloride) .Marland Kitchen.. 1 Once Daily 11)  Crestor 20 Mg Tabs (Rosuvastatin Calcium) .Marland Kitchen.. 1 Every Day 12)  Carbamazepine 200 Mg Tabs (Carbamazepine) .Marland Kitchen.. 1 1/2 Tab By Mouth Two Times A Day  Allergies (verified): No Known Drug Allergies  Past History:  Past Medical History: Reviewed history from 06/14/2009 and no changes required. 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 Appendectomy Stent placed in 09/2008  Social History: Reviewed history from 09/03/2009 and no changes required. 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  He was   divorced from his first wife and the second through fourth had died.  He   has been married to his current wife for about 17 years.  There are 2   children from his former marriage.  He is a pipe smoker.   He is retired from [employer] as a Education administrator.  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  The patient has given up his pipe.  He does not drink alcohol.  He does   not use drugs.  Review of Systems       Problems with sleeping but no fevers or chills, productive cough, hemoptysis, dysphasia, odynophagia, melena, hematochezia, dysuria, hematuria, rash, seizure activity, orthopnea, PND,  claudication. Remaining systems are negative.   Vital Signs:  Patient profile:   70 year old male Height:      68 inches Weight:      200 pounds BMI:     30.52 Pulse rate:   108 / minute BP sitting:   125 / 83  (left arm) Cuff size:   large  Vitals Entered By: Burnett Kanaris, CNA (October 22, 2009 10:40 AM)  Physical Exam  General:  Well-developed well-nourished in no acute distress.  Skin is warm and dry.  HEENT is normal.  Neck is supple. No thyromegaly.  Chest is clear to auscultation with normal expansion.  Cardiovascular exam is tachycardic and irregular. Abdominal exam nontender or distended. No masses palpated. Extremities show trace edema. neuro grossly intact    EKG  Procedure date:  10/22/2009  Findings:      Probable atrial flutter at a rate of 108. No significant ST changes.  Impression & Recommendations:  Problem # 1:  ATRIAL FIBRILLATION (ICD-427.31) Patient appears to be in atrial flutter at present. He does have a history of atrial fibrillation. He has now been on increased dose amiodarone for 1-1/2 months. I will obtain his INRs from his primary care physician. We will also check his INR today. If he has been greater then 2 for 3 consecutive weeks we will proceed with cardioversion. I think restoring sinus rhythm will make him feel better in the long term. Hopefully now that amiodarone is in his system he will hold sinus rhythm. He will need Coumadin for life given history of atrial fibrillation and embolic CVA. His  heart rate is elevated. He will increase his Toprol to 125 mg p.o. daily. When he returns he will need liver functions, TSH and chest x-ray. His updated medication list for this problem includes:    Warfarin Sodium 5 Mg Tabs (Warfarin sodium) ..... Use as directed by anticoagulation clinic    Metoprolol Succinate 100 Mg Xr24h-tab (Metoprolol succinate) .Marland Kitchen... Take one tablet by mouth daily    Aspirin 81 Mg Tbec (Aspirin) .Marland Kitchen... Take one tablet by mouth daily    Amiodarone Hcl 200 Mg Tabs (Amiodarone hcl) .Marland Kitchen... Take one tablet by mouth two times a day    Metoprolol Succinate 25 Mg Xr24h-tab (Metoprolol succinate) .Marland Kitchen... Take one tablet by mouth daily  Problem # 2:  COUMADIN THERAPY (ICD-V58.61) Monitored by his primary care.  Problem # 3:  HYPERTENSION (ICD-401.9) Blood pressure controlled on present medications. Will continue. His updated medication list for this problem includes:  Metoprolol Succinate 100 Mg Xr24h-tab (Metoprolol succinate) .Marland Kitchen... Take one tablet by mouth daily    Aspirin 81 Mg Tbec (Aspirin) .Marland Kitchen... Take one tablet by mouth daily    Furosemide 40 Mg Tabs (Furosemide) .Marland Kitchen... 1 tab by mouth once daily    Metoprolol Succinate 25 Mg Xr24h-tab (Metoprolol succinate) .Marland Kitchen... Take one tablet by mouth daily  Problem # 4:  HYPERCHOLESTEROLEMIA, PURE (ICD-272.0) Continue statin. His updated medication list for this problem includes:    Crestor 20 Mg Tabs (Rosuvastatin calcium) .Marland Kitchen... 1 every day  Problem # 5:  CVA (ICD-434.91)  His updated medication list for this problem includes:    Warfarin Sodium 5 Mg Tabs (Warfarin sodium) ..... Use as directed by anticoagulation clinic    Aspirin 81 Mg Tbec (Aspirin) .Marland Kitchen... Take one tablet by mouth daily  Problem # 6:  CAD (ICD-414.00) Continue aspirin, beta blocker and statin. His updated medication list for this problem includes:    Warfarin Sodium 5 Mg Tabs (Warfarin sodium) ..... Use as directed by anticoagulation clinic    Metoprolol  Succinate 100 Mg Xr24h-tab (Metoprolol succinate) .Marland Kitchen... Take one tablet by mouth daily    Aspirin 81 Mg Tbec (Aspirin) .Marland Kitchen... Take one tablet by mouth daily    Metoprolol Succinate 25 Mg Xr24h-tab (Metoprolol succinate) .Marland Kitchen... Take one tablet by mouth daily  Problem # 7:  COMPUTERIZED TOMOGRAPHY, CHEST, ABNORMAL (ICD-793.1) Management per pulmonary.  Patient Instructions: 1)  Your physician recommends that you schedule a follow-up appointment in: 8 WEEKS 2)  Your physician has recommended you make the following change in your medication: INCREASE METOPROLOL SUCC 125MG  ONCE DAILY 3)  COUMADIN CHECK TODAY Prescriptions: KLONOPIN 0.5 MG TABS (CLONAZEPAM) 1/2- 1 once daily  #30 x 6   Entered by:   Deliah Goody, RN   Authorized by:   Ferman Hamming, MD, [facility]   Signed by:   Deliah Goody, RN on 10/22/2009   Method used:   Print then Give to Patient   RxID:   [id number] KLOR-CON 20 MEQ PACK (POTASSIUM CHLORIDE) 1 once daily  #90 x 4   Entered by:   Deliah Goody, RN   Authorized by:   Ferman Hamming, MD, [facility]   Signed by:   Deliah Goody, RN on 10/22/2009   Method used:   Electronically to        [facility]. [address] [phone]* (retail)       [address] [place], [place]  [id number]       Ph: [phone]       Fax: [phone]   RxID:   [id number] CRESTOR 20 MG TABS (ROSUVASTATIN CALCIUM) 1 EVERY DAY  #90 x 4   Entered by:   Deliah Goody, RN   Authorized by:   Ferman Hamming, MD, [facility]   Signed by:   Deliah Goody, RN on 10/22/2009   Method used:   Electronically to        [facility]. [address] [phone]* (retail)       [address] [place], [place]  [address]       Ph: [phone]       Fax: [phone]   RxID:   [id number] FUROSEMIDE 40 MG TABS (FUROSEMIDE) 1 tab by mouth once daily  #90 x 3   Entered by:   Deliah Goody, RN   Authorized by:   Ferman Hamming, MD, [facility]   Signed by:  Deliah Goody, RN on 10/22/2009    Method used:   Electronically to        [facility]. [address] [phone]* (retail)       [address] [place], [place]  [address]       Ph: [phone]       Fax: [phone]   RxID:   [id number] METOPROLOL SUCCINATE 25 MG XR24H-TAB (METOPROLOL SUCCINATE) Take one tablet by mouth daily  #30 x 12   Entered by:   Deliah Goody, RN   Authorized by:   Ferman Hamming, MD, [facility]   Signed by:   Deliah Goody, RN on 10/22/2009   Method used:   Electronically to        [facility]. [address] [phone]* (retail)       [address] [place], [place]  [address]       Ph: [phone]       Fax: [phone]   RxID:   [id number]

## 2010-08-05 NOTE — Letter (Signed)
Summary: Generic Letter  [facility]  [address]   [place], [place] [address]   Phone: [phone]  Fax: [phone]    06/12/2010  Darrell Hayes [address] [place], [place]  [address]  Xray Orders  Xray KUB:  Evaluate for Constipation  Diagnoses: 789.00, 564.09  Please Fax Results to [phone]       Sincerely,   Standley Dakins MD

## 2010-08-05 NOTE — Progress Notes (Signed)
Summary: lorazepam, tegretol  Phone Note Refill Request Message from:  daughter  Refills Requested: Medication #1:  LORAZEPAM 0.5 MG TABS Every 6 hours as needed  Medication #2:  CARBATROL 100 MG XR12H-CAP take three tablets twice daily [facility] to [facility] [place].  Initial call taken by: Lowella Petties CMA, AAMA,  May 14, 2010 3:48 PM  Follow-up for Phone Call        please call in.  needs cbc, cmet, fasting lipid, TSH and carbamazepine level (AM level before AM dose) v58.83.  please arrange for lab visit.  amiodarone rx needs to come through cardiology clinic.  Follow-up by: Crawford Givens MD,  May 14, 2010 10:58 PM  Additional Follow-up for Phone Call Additional follow up Details #1::        Left message on voicemail  to return call.   Rx. request for Amiodarone sent to Dr. Jens Som.  Lab appt. tentatively made; awaiting confirmation.  Lugene Fuquay CMA [facility])  May 15, 2010 11:46 AM   noted. Crawford Givens MD  May 15, 2010 1:17 PM     Prescriptions: CARBATROL 100 MG XR12H-CAP (CARBAMAZEPINE) take three tablets twice daily  #180 x 5   Entered and Authorized by:   Crawford Givens MD   Signed by:   Crawford Givens MD on 05/14/2010   Method used:   Telephoned to ...       [facility] [address] [phone]* (retail)       [address] [place], [place]  [address]       Ph: [phone]       Fax: [phone]   RxID:   [id number] LORAZEPAM 0.5 MG TABS (LORAZEPAM) Every 6 hours as needed  #30 x 5   Entered and Authorized by:   Crawford Givens MD   Signed by:   Crawford Givens MD on 05/14/2010   Method used:   Telephoned to ...       [facility] [address] [phone]* (retail)       [address] [place], [place]  [address]       Ph: [phone]       Fax: [phone]   RxID:   [id number]

## 2010-08-05 NOTE — Assessment & Plan Note (Signed)
Summary: 2 month rov/sl   Referring Provider:  Dr. Jens Som Primary Provider:  Dr. Timoteo Gaul, [facility] in [place]   History of Present Illness: [name] returns for followup today.  He has a history of atrial fibrillation that is paroxysmal.  He also had a previous stroke related to this and is on chronic Coumadin. Last echocardiogram was performed on September 18, 2008. LV function was normal. Trivial aortic insufficiency and mild left atrial enlargement. He underwent cardiac catheterization on September 26, 2008.  There was a 90% stenosis in the LAD proper.  There was also an 80% stenosis in the distal LAD.  There was no other significant disease noted.  His ejection fraction was 55%.  The patient subsequently underwent PCI of the mid LAD lesion with a bare-metal stent.  There was a CT scan performed on September 10, 2008, that showed no pulmonary embolus.  There were reticular nodular opacities in the superior segment of the right lower lobe felt possibly secondary to an infectious or inflammatory process.  Pulmonary is now following this. He is also that his post cardioversion on amiodarone. I last saw him in April 2011. Since then he was admitted to [facility] with question CVA. However apparently this was ruled out. Since then he denies any dyspnea, chest pain, palpitations, syncope or bleeding. He has mild pedal edema and his Lasix was resumed at 20 mg daily recently.   Current Medications (verified): 1)  Warfarin Sodium 5 Mg Tabs (Warfarin Sodium) .... Use As Directed By Anticoagulation Clinic 2)  Aspirin 81 Mg Tbec (Aspirin) .... Take One Tablet By Mouth Daily 3)  Klonopin 0.5 Mg Tabs (Clonazepam) .... 1/2- 1 Once Daily 4)  Vitamin D3 1000 Iu Qd .Marland Kitchen.. 1 Tab By Mouth Once Daily 5)  Fish Oil 1000 Mg Caps (Omega-3 Fatty Acids) .... Take 1 Capsule By Mouth Once A Day 6)  Tegretol 200 Mg Tabs (Carbamazepine) .... Take 1 1/2 Tablet Two Times A Day 7)  Crestor 20 Mg Tabs (Rosuvastatin  Calcium) .Marland Kitchen.. 1 Every Day 8)  Carbamazepine 200 Mg Tabs (Carbamazepine) .Marland Kitchen.. 1 1/2 Tab By Mouth Two Times A Day 9)  Furosemide 20 Mg Tabs (Furosemide) .... Take One Tablet By Mouth Daily. 10)  Metoprolol Succinate 50 Mg Xr24h-Tab (Metoprolol Succinate) .... Take One Tablet By Mouth Daily 11)  Amiodarone Hcl 200 Mg Tabs (Amiodarone Hcl) .... 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 Appendectomy Stent placed in 09/2008  Social History: Reviewed history from 09/03/2009 and no changes required. 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 Smoked pipe-quit 2007, used pipe x 98yrs  Alcohol Use - no Regular Exercise - no ambulates with cane or walker   The patient is married to his sixth wife.  He was   divorced from his first wife and the second through fourth had died.  He   has been married to his current wife for about 17 years.  There are 2  children from his former marriage.  He is a pipe smoker.   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  The patient has given up his pipe.  He does not drink alcohol.  He does   not use drugs.  Review of Systems       no fevers or chills, productive cough, hemoptysis, dysphasia, odynophagia, melena, hematochezia,  dysuria, hematuria, rash, seizure activity, orthopnea, PND,  claudication. Remaining systems are negative.   Vital Signs:  Patient profile:   70 year old male Height:      68 inches Weight:      199 pounds BMI:     30.37 Pulse rate:   51 / minute BP sitting:   123 / 73  (left arm) Cuff size:   large  Vitals Entered By: Burnett Kanaris, CNA (December 25, 2009 8:11 AM)  Physical Exam  General:  Well-developed well-nourished in no acute distress.  Skin is warm and dry.  HEENT is normal.  Neck is supple. No thyromegaly.  Chest is clear to auscultation with normal expansion.  Cardiovascular exam is bradycardic Abdominal exam nontender or distended. No masses palpated. Extremities show trace edema. neuro Status post CVA with residual right-sided weakness.    EKG  Procedure date:  12/25/2009  Findings:      Sinus bradycardia with first degree AV block. No significant ST changes.  Impression & Recommendations:  Problem # 1:  OTHER AND UNSPECIFIED HYPERLIPIDEMIA (ICD-272.4)  Continue statin. His updated medication list for this problem includes:    Crestor 20 Mg Tabs (Rosuvastatin calcium) .Marland Kitchen... 1 every day  His updated medication list for this problem includes:    Crestor 20 Mg Tabs (Rosuvastatin calcium) .Marland Kitchen... 1 every day  Problem # 2:  CAD (ICD-414.00)  Continue aspirin, blocker and statin. His updated medication list for this problem includes:    Warfarin Sodium 5 Mg Tabs (Warfarin sodium) ..... Use as directed by anticoagulation clinic    Aspirin 81 Mg Tbec (Aspirin) .Marland Kitchen... Take one tablet by mouth daily    Metoprolol Succinate 50 Mg Xr24h-tab (Metoprolol succinate) .Marland Kitchen... Take one tablet by mouth daily  His updated medication list for this problem includes:    Warfarin Sodium 5 Mg Tabs (Warfarin sodium) ..... Use as directed by anticoagulation clinic    Aspirin 81 Mg Tbec (Aspirin) .Marland Kitchen... Take one tablet by mouth daily    Metoprolol Succinate 50 Mg Xr24h-tab (Metoprolol  succinate) .Marland Kitchen... Take one tablet by mouth daily  Problem # 3:  ENCOUNTER FOR LONG-TERM USE OF OTHER MEDICATIONS (ICD-V58.69) Patient on amiodarone. Recent liver functions and chest x-ray performed. Check TSH. Orders: TLB-TSH (Thyroid Stimulating Hormone) ([id number])  Problem # 4:  HYPERTENSION (ICD-401.9) Blood pressure controlled on present medications. Will continue. His updated medication list for this problem includes:    Aspirin 81 Mg Tbec (Aspirin) .Marland Kitchen... Take one tablet by mouth daily    Furosemide 20 Mg Tabs (Furosemide) .Marland Kitchen... Take one tablet by mouth daily.    Metoprolol Succinate 50 Mg Xr24h-tab (Metoprolol succinate) .Marland Kitchen... Take one tablet by mouth daily  His updated medication list for this problem includes:    Aspirin 81 Mg Tbec (Aspirin) .Marland Kitchen... Take one tablet by mouth daily    Furosemide 20 Mg Tabs (Furosemide) .Marland Kitchen... Take one tablet by mouth daily.    Metoprolol Succinate 50 Mg Xr24h-tab (  Metoprolol succinate) .Marland Kitchen... Take one tablet by mouth daily  Problem # 5:  COUMADIN THERAPY (ICD-V58.61) Monitored by his primary care physician.  Problem # 6:  CVA (ICD-434.91)  His updated medication list for this problem includes:    Warfarin Sodium 5 Mg Tabs (Warfarin sodium) ..... Use as directed by anticoagulation clinic    Aspirin 81 Mg Tbec (Aspirin) .Marland Kitchen... Take one tablet by mouth daily  His updated medication list for this problem includes:    Warfarin Sodium 5 Mg Tabs (Warfarin sodium) ..... Use as directed by anticoagulation clinic    Aspirin 81 Mg Tbec (Aspirin) .Marland Kitchen... Take one tablet by mouth daily  Problem # 7:  ATRIAL FIBRILLATION (ICD-427.31) Patient remains in sinus rhythm. Decrease amiodarone to 200 mg p.o. daily. Continue beta blocker. He will need lifelong Coumadin given prior history of embolic CVA. His updated medication list for this problem includes:    Warfarin Sodium 5 Mg Tabs (Warfarin sodium) ..... Use as directed by anticoagulation clinic    Aspirin 81 Mg  Tbec (Aspirin) .Marland Kitchen... Take one tablet by mouth daily    Metoprolol Succinate 50 Mg Xr24h-tab (Metoprolol succinate) .Marland Kitchen... Take one tablet by mouth daily    Amiodarone Hcl 200 Mg Tabs (Amiodarone hcl) .Marland Kitchen... 1 once daily  Orders: EKG w/ Interpretation (93000)  His updated medication list for this problem includes:    Warfarin Sodium 5 Mg Tabs (Warfarin sodium) ..... Use as directed by anticoagulation clinic    Aspirin 81 Mg Tbec (Aspirin) .Marland Kitchen... Take one tablet by mouth daily    Metoprolol Succinate 50 Mg Xr24h-tab (Metoprolol succinate) .Marland Kitchen... Take one tablet by mouth daily    Amiodarone Hcl 200 Mg Tabs (Amiodarone hcl) .Marland Kitchen... Take one tablet two times a day  Patient Instructions: 1)  Your physician recommends that you schedule a follow-up appointment in: WITH DR CRENSHAW 2)  Your physician recommends that you continue on your current medications as directed. Please refer to the Current Medication list given to you today.AMIODARONE  200 MG once daily 3)  Your physician recommends that you return for lab work QI:ONGEX TSH  V58.69

## 2010-08-05 NOTE — Medication Information (Signed)
Summary: Coumadin Clinic  Anticoagulant Therapy  Managed by: Inactive Referring MD: Jens Som MD, Arlys John PCP: Dr. Timoteo Gaul, [facility] in [place] MD: Gala Romney MD, Reuel Boom Indication 1: Atrial Fibrillation (ICD-427.31) Lab Used:  [facility] Site: [address] INR RANGE 2.0-3.0          Comments: Per Dr. Ludwig Clarks office note, INRs followed by pt's PCP  Allergies: No Known Drug Allergies  Anticoagulation Management History:      Positive risk factors for bleeding include an age of 10 years or older and history of CVA/TIA.  The bleeding index is 'intermediate risk'.  Positive CHADS2 values include History of HTN and Prior Stroke/CVA/TIA.  Negative CHADS2 values include Age > 5 years old.  The start date was 09/29/2008.  Anticoagulation responsible provider: Bensimhon MD, Reuel Boom.    Anticoagulation Management Assessment/Plan:      The patient's current anticoagulation dose is Warfarin sodium 5 mg tabs: Use as directed by Anticoagulation Clinic.  The target INR is 2 - 3.  The next INR is due 11/05/2009.  Anticoagulation instructions were given to patient/daughter.  Results were reviewed/authorized by Inactive.         Prior Anticoagulation Instructions: The patient is to continue with the same dose of coumadin.  This dosage includes: coumadin 5 mg daily with 2.5 mg on Tues and Friday

## 2010-08-05 NOTE — Discharge Summary (Signed)
Summary: Hospital Discharge Update (unilateral weakness, no stroke)    Hospital Discharge Update:  Date of Admission: 11/21/2009 Date of Discharge: 11/27/2009  Brief Summary:  Patient came with left sided weakness- stroke excluded  Lab or other results pending at discharge:  none  Labs needed at follow-up: Basic metabolic panel, PT/INR  Other follow-up issues:  Has hypotension with sys BP 95-100 at D/c.  Bradycardia- HR of  55-60.  Discontinued metoprolol, lasix and amiodarone at d/c for above reason.  Medication list changes:  Removed medication of METOPROLOL SUCCINATE 100 MG XR24H-TAB (METOPROLOL SUCCINATE) Take one tablet by mouth daily Removed medication of FUROSEMIDE 40 MG TABS (FUROSEMIDE) 1 tab by mouth once daily Removed medication of AMIODARONE HCL 200 MG TABS (AMIODARONE HCL) Take one tablet by mouth two times a day Removed medication of METOPROLOL SUCCINATE 25 MG XR24H-TAB (METOPROLOL SUCCINATE) Take one tablet by mouth daily Removed medication of KLOR-CON 20 MEQ PACK (POTASSIUM CHLORIDE) 1 once daily  The medication, problem, and allergy lists have been updated.  Please see the dictated discharge summary for details.  Discharge medications:  WARFARIN SODIUM 5 MG TABS (WARFARIN SODIUM) Use as directed by Anticoagulation Clinic ASPIRIN 81 MG TBEC (ASPIRIN) Take one tablet by mouth daily KLONOPIN 0.5 MG TABS (CLONAZEPAM) 1/2- 1 once daily * VITAMIN D3 1000 IU QD 1 tab by mouth once daily FISH OIL 1000 MG CAPS (OMEGA-3 FATTY ACIDS) Take 1 capsule by mouth once a day TEGRETOL 200 MG TABS (CARBAMAZEPINE) take 1 1/2 tablet two times a day CRESTOR 20 MG TABS (ROSUVASTATIN CALCIUM) 1 EVERY DAY CARBAMAZEPINE 200 MG TABS (CARBAMAZEPINE) 1 1/2 tab by mouth two times a day  Other patient instructions:  Follow up with [facility] in 1-2 weeks Follow up with Dr. Jens Som in a month Stop taking- METOPROLOL, LASIX, AMIODARONE, POTASSIUM  Note: Hospital Discharge  Medications & Other Instructions handout was printed, one copy for patient and a second copy to be placed in hospital chart.

## 2010-08-05 NOTE — Assessment & Plan Note (Signed)
Summary: follow-up//jrc   Visit Type:  Follow-up Copy to:  Dr. Jens Som Primary Jafari Mckillop/Referring Larinda Herter:  Dr. Timoteo Gaul, [facility] in [place]  CC:  Pt here3 for follow-up to discuss CT results. .  History of Present Illness: Darrell Hayes Pulmonary patient since Nov 2010 for Class 3 dyspnea in setting of A Fib RVR. and pulmnary nodules/infiltrates. EX-heavy smoker.     March 31, 2010: Last seen Dec 2010. At that time after review of entire investigations for dyspnea the concern was that it was A FIB RVR causing dyspnea. He did undrego dc cardioversion on 08/23/2009  and on 09/03/2009 was started on amiodarone (receivd 400mg  per day for first month and possibly thrugh end june 2011). AFter this dyspnea resolved. Today he states no complaints. He was admited 11/27/2009 for a left weakness believed to be conversion disorder. Now in a nursing home but due to go back and live with daughter imminently. Feels well. Both deny complaints. Daughter knows all his meds. THese were reviewed and updated.     Preventive Screening-Counseling & Management  Alcohol-Tobacco     Smoking Status: quit     Year Started: 1980     Year Quit: 2007     Pipe use/week: 14  Current Medications (verified): 1)  Warfarin Sodium 5 Mg Tabs (Warfarin Sodium) .... Use As Directed By Anticoagulation Clinic 2)  Aspirin 81 Mg Tbec (Aspirin) .... Take One Tablet By Mouth Daily 3)  Lorazepam 0.5 Mg Tabs (Lorazepam) .... Every 6 Hours As Needed 4)  Vitamin D3 1000 Iu Qd .Marland Kitchen.. 1 Tab By Mouth Once Daily 5)  Fish Oil 1000 Mg Caps (Omega-3 Fatty Acids) .... Take 1 Capsule By Mouth Once A Day 6)  Crestor 20 Mg Tabs (Rosuvastatin Calcium) .Marland Kitchen.. 1 Every Day 7)  Carbatrol 100 Mg Xr12h-Cap (Carbamazepine) .... Take Three Tablets Twice Daily 8)  Furosemide 20 Mg Tabs (Furosemide) .... Take One Tablet By Mouth Daily. 9)  Amiodarone Hcl 200 Mg Tabs (Amiodarone Hcl) .Marland Kitchen.. 1 Once Daily 10)  Lexapro 10 Mg Tabs (Escitalopram Oxalate)  .... Take 1 Tablet By Mouth Once A Day  Allergies (verified): No Known Drug Allergies  Past History:  Past medical, surgical, family and social histories (including risk factors) reviewed, and no changes noted (except as noted below).  Past Medical History: #HYPERCHOLESTEROLEMIA, PURE (ICD-272.0) #HYPERTENSION (ICD-401.9) #COUMADIN THERAPY (ICD-V58.61) #Atrial Fibrillation .Marland KitchenMarland KitchenMarland KitchenDr Doreatha Lew  - s/p cardioversion 08/23/2009  - s/p amiodarone Rx since 09/03/2009 #CVA in the setting of Atrial Fib in 2007 #Hiatal Hernia #CAD ...........Marland KitchenDr Jens Som  - s/p Bare Metal stent to LAD March 2010  - Normal LV function on Cath March 2010 and normal BNP Oc 2010 #New onset Seizures June 2010.Marland KitchenMarland KitchenMarland KitchenDr Sharene Skeans [facility] NODULES  -Non specific reticulonodular nodules RLL superior segment March 2010  - REsolved on CT 06/14/09   - No further followup #Unspec alveolar pneumonoapathy  - 06/14/09 CT CHEst: Mild LLL GGO   - 02/28/2010 CT chest: improved #HX OF TOBACCO ABUSE #DYSPNEA - Class 3  - Since 2008.  Significant tachycardia with exertion to 75 feet March 2010  -  No PE or Fibrosis on CT chest 3/10, 12/10 and 02/2010  - Normal PFT but isolated low DLCO -- dec 2010  - Normal hgb, tsh, creat and albumin - dec 2010  - REsolved after A Fib cardioversion and amiodarone March 2011  -     Past Surgical History: Reviewed history from 05/28/2009 and no changes required. Appendectomy Stent placed in 09/2008  Family History:  Reviewed history from 09/10/2008 and no changes required. Father:deceased in 1's from CVA Mother:alive age 69  Social History: Reviewed history from 12/25/2009 and no changes required. 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 Smoked pipe-quit 2007, used pipe x 52yrs , started in 1980 Alcohol Use - no Regular Exercise - no ambulates with cane or walker   The patient is married to his sixth wife.  He was   divorced from his first wife and the second  through fourth had died.  He   has been married to his current wife for about 17 years.  There are 2   children from his former marriage.  He is a pipe smoker.   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  The patient has given up his pipe.  He does not drink alcohol.  He does   not use drugs.Smoking Status:  quit Pipe use/week:  14  Review of Systems  The patient denies shortness of breath with activity, shortness of breath at rest, productive cough, non-productive cough, coughing up blood, chest pain, irregular heartbeats, acid heartburn, indigestion, loss of appetite, weight change, abdominal pain, difficulty swallowing, sore throat, tooth/dental problems, headaches, nasal congestion/difficulty breathing through nose, sneezing, itching, ear ache, anxiety, depression, hand/feet swelling, joint stiffness or pain, rash, change in color of mucus, and fever.    Vital Signs:  Patient profile:   70 year old male Height:      68 inches Weight:      211.25 pounds BMI:     32.24 O2 Sat:      95 % on Room air Temp:     98.1 degrees F oral Pulse rate:   56 / minute BP sitting:   112 / 70  (right arm) Cuff size:   regular  Vitals Entered By: Carron Curie CMA (March 31, 2010 11:45 AM)  O2 Flow:  Room air CC: Pt here3 for follow-up to discuss CT results.  Comments Medications reviewed with patient Carron Curie CMA  March 31, 2010 11:49 AM Daytime phone number verified with patient.    Physical Exam  General:  elderly person, no distress, sitting in wheel chair. Looks well.  Head:  normocephalic and atraumatic Eyes:  PERRLA/EOM intact; conjunctiva and sclera clear Ears:  TMs intact and clear with normal canals Nose:  no deformity, discharge, inflammation, or lesions Mouth:  no deformity or lesions Neck:  no masses, thyromegaly, or abnormal  cervical nodes Chest Wall:  no deformities noted Lungs:  clear bilaterally to auscultation and percussion Heart:  regular rate and rhythm, S1, S2 without murmurs, rubs, gallops, or clicks Abdomen:  bowel sounds positive; abdomen soft and non-tender without masses, or organomegaly Msk:  no deformity or scoliosis noted with normal posture Pulses:  pulses normal Extremities:  no clubbing, cyanosis, edema, or deformity noted Neurologic:  hemiplegic gait oriented but poor historian slurred speech Skin:  intact without lesions or rashes Cervical Nodes:  no significant adenopathy Axillary Nodes:  no significant adenopathy Psych:  easily distracted, poor concentration, and poor historian.     CT of Chest  Procedure date:  03/31/2010  Findings:      LLL sup segment GGO is decreasing. 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 Patient Level III (76160)  Problem # 2:  SHORTNESS OF BREATH (SOB) (ICD-786.05) Assessment: Improved  resolved after correction of A FIb rvr in march 2011  plan monitiro clinically give flu shot today  Orders: Est. Patient Level III (73710)  Problem # 3:  PULMONARY NODULE (ICD-518.89)  Medications Added to Medication List This Visit: 1)  Lorazepam 0.5 Mg Tabs (Lorazepam) .... Every 6 hours as needed 2)  Carbatrol 100 Mg Xr12h-cap (Carbamazepine) .... Take three tablets twice daily 3)  Lexapro 10 Mg Tabs (Escitalopram oxalate) .... Take 1 tablet by mouth once a day  Patient Instructions: 1)  ct scan is looking better 2)  next ct scan is in 9-12 months 3)  have full bretahing test in 9-12 months 4)  return in 9-12 months  5)  check  with nursing home if you need flu shot 6)  come sooner if there are new problems   Immunization History:  Influenza Immunization History:    Influenza:  fluvax 3+ (03/06/2009)  Pneumovax Immunization History:    Pneumovax:  pneumovax (03/06/2008)   Appended Document: follow-up//jrc Arlys John, Here is my note from today. I think his dyspnea was related to A Fib. Now resolved after cardioversion and amio. Thanks, [employer]

## 2010-08-05 NOTE — Progress Notes (Signed)
Summary: gong to [facility]   Phone Note Call from Patient Call back at Home Phone [phone]   Caller: Daughter Call For: Crawford Givens MD Summary of Call: Patient's daughter called requesting appt for tomorrow after 4:30. She says that her dad could possibly have a UTi. I offered her a 3:00 appt, she said that she couldl not bring him at that time because she can't leave work Associate Professor. I suggested to her the [facility] or Urgent Care. She said that she will be taking him to the [facility] tomorrow.  Initial call taken by: Melody Comas,  June 11, 2010 5:35 PM  Follow-up for Phone Call        noted.  Follow-up by: Crawford Givens MD,  June 11, 2010 9:44 PM

## 2010-08-05 NOTE — Medication Information (Signed)
Summary: coumadin check  Anticoagulant Therapy  Managed by: Weston Brass, PharmD Referring MD: Jens Som MD, Arlys John PCP: Dr. Timoteo Gaul, [facility] in [place] MD: Excell Seltzer MD, Casimiro Needle Indication 1: Atrial Fibrillation (ICD-427.31) Lab Used: [facility] [place] Site: [address] INR POC 2.6 INR RANGE 2.0-3.0  Dietary changes: no    Health status changes: no    Bleeding/hemorrhagic complications: no    Recent/future hospitalizations: no    Any changes in medication regimen? no    Recent/future dental: no  Any missed doses?: no       Is patient compliant with meds? yes      Comments: Pt pending cardioversion.  Has been getting INRs checked at [facility].  Will transition to our [place] office for PT/INR checks.   Allergies: No Known Drug Allergies  Anticoagulation Management History:      Positive risk factors for bleeding include an age of 70 years or older and history of CVA/TIA.  The bleeding index is 'intermediate risk'.  Positive CHADS2 values include History of HTN and Prior Stroke/CVA/TIA.  Negative CHADS2 values include Age > 70 years old.  The start date was 09/29/2008.  Anticoagulation responsible provider: Excell Seltzer MD, Casimiro Needle.  INR POC: 2.6.    Anticoagulation Management Assessment/Plan:      The patient's current anticoagulation dose is Warfarin sodium 5 mg tabs: Use as directed by Anticoagulation Clinic.  The target INR is 2 - 3.  The next INR is due 10/29/2009.  Anticoagulation instructions were given to patient/daughter.  Results were reviewed/authorized by Weston Brass, PharmD.  He was notified by Weston Brass PharmD.         Prior Anticoagulation Instructions: INR 2.84 LMOM for pt. to call for dosing. Bethena Midget, RN, BSN  August 20, 2009 9:10 AM Spoke with pt. wife and she states that pt is taking 5mg s daily except 7mg s on Fridays. He is aware to continue. Info forward to D. Betsey Holiday, RN Bethena Midget, RN, BSN  August 20, 2009 3:38 PM   Current Anticoagulation Instructions: INR 2.6  Continue current dose of 5mg  (1 tablet) x 2 days then 2.5mg  (1/2 tablet) x 1 day

## 2010-08-05 NOTE — Progress Notes (Signed)
Summary: Amiodarone  Phone Note Refill Request Message from:  [employer]line on May 15, 2010 11:41 AM  Refills Requested: Medication #1:  AMIODARONE HCL 200 MG TABS 1 once daily [facility] in [place].     Phone:  This was requested at our office (Dr. Para March) along with other refills.  Dr. Para March asks that this be refilled by Cardiology.   Method Requested: Electronic Initial call taken by: Delilah Shan CMA Duncan Dull),  May 15, 2010 11:41 AM    Prescriptions: AMIODARONE HCL 200 MG TABS (AMIODARONE HCL) 1 once daily  #30 x 11   Entered by:   Scherrie Bateman, LPN   Authorized by:   Ferman Hamming, MD, [facility]   Signed by:   Scherrie Bateman, LPN on 16/04/9603   Method used:   Electronically to        [facility]. [phone]* (retail)       [address]       [place], [place]  [address]       Ph: [phone]       Fax: [phone]   RxID:   [id number]

## 2010-08-05 NOTE — Miscellaneous (Signed)
Summary: Durable & General Power of [employer] & General Power of Attorney   Imported By: Lanelle Bal 04/15/2010 13:54:34  _____________________________________________________________________  External Attachment:    Type:   Image     Comment:   External Document  Appended Document: Durable & General Power of Attorney Pt appoints La/Rrelle Victoriano Lain as HCPOA should patient not be able to make medical decisions.  Please enter living will into EMR.   Appended Document: Durable & General Power of Attorney Done and sent for scanning.

## 2010-08-05 NOTE — Medication Information (Signed)
Summary: rov/sp  Anticoagulant Therapy  Managed by: Weston Brass, PharmD Referring MD: Jens Som MD, Arlys John PCP: Dr. Timoteo Gaul, [facility] in [place] MD: Gala Romney MD, Reuel Boom Indication 1: Atrial Fibrillation (ICD-427.31) Lab Used: [facility] [place] Site: [address] INR POC 2.7 INR RANGE 2.0-3.0  Dietary changes: no    Health status changes: no    Bleeding/hemorrhagic complications: no    Recent/future hospitalizations: no    Any changes in medication regimen? no    Recent/future dental: no  Any missed doses?: no       Is patient compliant with meds? yes       Allergies: No Known Drug Allergies  Anticoagulation Management History:      The patient is taking warfarin and comes in today for a routine follow up visit.  Positive risk factors for bleeding include an age of 70 years or older and history of CVA/TIA.  The bleeding index is 'intermediate risk'.  Positive CHADS2 values include History of HTN and Prior Stroke/CVA/TIA.  Negative CHADS2 values include Age > 70 years old.  The start date was 09/29/2008.  Anticoagulation responsible provider: Allona Gondek MD, Reuel Boom.  INR POC: 2.7.    Anticoagulation Management Assessment/Plan:      The patient's current anticoagulation dose is Warfarin sodium 5 mg tabs: Use as directed by Anticoagulation Clinic.  The target INR is 2 - 3.  The next INR is due 11/05/2009.  Anticoagulation instructions were given to patient/daughter.  Results were reviewed/authorized by Weston Brass, PharmD.  He was notified by Charlena Cross, RN, BSN.         Prior Anticoagulation Instructions: INR 2.6  Continue current dose of 5mg  (1 tablet) x 2 days then 2.5mg  (1/2 tablet) x 1 day  Current Anticoagulation Instructions: The patient is to continue with the same dose of coumadin.  This dosage includes: coumadin 5 mg daily with 2.5 mg on Tues and Friday

## 2010-08-05 NOTE — Assessment & Plan Note (Signed)
Summary: NEW PT TO EST/CLE   Vital Signs:  Patient profile:   70 year old male Height:      68 inches Weight:      206.50 pounds BMI:     31.51 Temp:     98.3 degrees F oral Pulse rate:   68 / minute Pulse rhythm:   regular BP sitting:   152 / 82  (left arm) Cuff size:   large  Vitals Entered By: Delilah Shan CMA Duncan Dull) (April 08, 2010 1:55 PM) CC: NP to Establish   History of Present Illness: CVA- speech and sensation/motor affected.  No trouble swallowing.  He is able to get out of bed, needs help with dressing, toilets alone, daughter cooks, not driving.  Has help with [facility].  Daughter was asking about continued help with this.   H/o depression/irritability related to prev marriage and separation.  "I want to get off some of this medicine."  No SI/HI.   Outlook is bright and he understands that symptoms may return off meds.  see plan.   Allergies: No Known Drug Allergies  Past History:  Past Medical History: #HYPERCHOLESTEROLEMIA, PURE (ICD-272.0) #HYPERTENSION (ICD-401.9) #COUMADIN THERAPY (ICD-V58.61) #Atrial Fibrillation .Marland KitchenMarland KitchenMarland KitchenDr Doreatha Lew  - s/p cardioversion 08/23/2009  - s/p amiodarone Rx since 09/03/2009 #CVA in the setting of Atrial Fib in 2007 #Hiatal Hernia #CAD ...........Marland KitchenDr Jens Som  - s/p Bare Metal stent to LAD March 2010  - Normal LV function on Cath March 2010 and normal BNP Oc 2010 #New onset Seizures June 2010.Marland KitchenMarland KitchenMarland KitchenDr Sharene Skeans [facility] NODULES  -Non specific reticulonodular nodules RLL superior segment March 2010  - REsolved on CT 06/14/09   - No further followup #Unspec alveolar pneumonoapathy  - 06/14/09 CT CHEst: Mild LLL GGO   - 02/28/2010 CT chest: improved #HX OF TOBACCO ABUSE #DYSPNEA - Class 3  - Since 2008.  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 Father:deceased in 60's from CVA Mother:alive, HLD, HTN, OA Family History Hypertension, parents Family History of Stroke F 1st degree relative <60, parents Family History of Heart Disease, other blood relative  Social History: Reviewed history from 03/31/2010 and no changes required. Retired Education administrator from [employer] Married, separated Lives with daughter-Larrelle  [phone] who works first shift as does her husband.   2 children Tobacco Use - Yes. Smoked pipe-quit 2007, used pipe x 103yrs , started in 1980 Alcohol Use - no Regular Exercise - no ambulation with cane or walker Has been disabled since his stroke.  Review of Systems       See HPI.  Otherwise negative.    Physical Exam  General:  GEN: nad, alert and oriented HEENT: mucous membranes moist, TM wnl NECK: supple w/o LA CV: rrr PULM: ctab, no inc wob  ABD: soft, +bs EXT: no edema SKIN: no acute rash  R facial droop noted, increase tone in R arm and leg with fingers flexed. increase dtr for R arm and leg vs left side. R foot in brace.  No skin breakdown on feet.    Impression & Recommendations:  Problem # 1:  CEREBROVASCULAR ACCIDENT, HX OF (ICD-V12.50) >25 min spent with patient, at least half  of which was spent on counseling and discussing treatment/plan.  Continue current meds.  Will have daughter check with home health and notify me if I can be of service with that.   Problem # 2:  DEPRESSION (ICD-311) decrease SSRI and follow for change in symptoms.  Pt and daughter agree.  His updated medication list for this problem includes:    Lorazepam 0.5 Mg Tabs (Lorazepam) ..... Every 6 hours as needed    Lexapro 10 Mg Tabs (Escitalopram oxalate) .Marland Kitchen... Take 1/2 tablet by mouth once a day  Complete Medication List: 1)  Warfarin Sodium 5 Mg Tabs (Warfarin sodium) .... Use as directed by anticoagulation clinic 2)  Aspirin 81 Mg Tbec (Aspirin) .... Take one tablet by mouth daily 3)  Lorazepam 0.5 Mg Tabs (Lorazepam) .... Every 6 hours as needed 4)  Vitamin D3 1000 Iu Qd  .Marland Kitchen.. 1 tab by mouth once daily 5)  Fish Oil 1000 Mg Caps (Omega-3 fatty acids) .... Take 1 capsule by mouth once a day 6)  Crestor 20 Mg Tabs (Rosuvastatin calcium) .Marland Kitchen.. 1 every day 7)  Carbatrol 100 Mg Xr12h-cap (Carbamazepine) .... Take three tablets twice daily 8)  Furosemide 20 Mg Tabs (Furosemide) .... Take one tablet by mouth daily. 9)  Amiodarone Hcl 200 Mg Tabs (Amiodarone hcl) .Marland Kitchen.. 1 once daily 10)  Lexapro 10 Mg Tabs (Escitalopram oxalate) .... Take 1/2 tablet by mouth once a day 11)  [place] 2,000 - 1,000 Mg.  .... Take 1 tablet by mouth once a day  Patient Instructions: 1)  Glad to see you today.   2)  I'd like to see you back in 09/2010 for a 30 min appointment.  Sooner as needed. 3)  Let me know if you need help with continued home health. 4)  Check with your insurance to see if they will cover the shingles shot.  You can get a nurse visit to get the shot after that.  5)  Cut back to 1/2 tab of lexapro a day.  If you are not doing well with that, then increase back to a whole tab a day.   Current Allergies (reviewed today): No known allergies    Appended Document: NEW PT TO EST/CLE    Clinical  Lists Changes  Observations: Added new observation of FLU VAX: Historical (03/31/2010 23:28)       Immunization History:  Influenza Immunization History:    Influenza:  historical (03/31/2010)

## 2010-08-05 NOTE — Progress Notes (Signed)
Summary: wants verbal order for speech therapy  Phone Note From Other Clinic Call back at [phone]   Caller: Johnnye Lana, speech therapist with [facility] Summary of Call: Speech therapist called requesting verbal order for speech therapy.   Initial call taken by: Lowella Petties CMA,  April 10, 2010 2:33 PM  Follow-up for Phone Call        please give order.  dx [phone] Follow-up by: Crawford Givens MD,  April 10, 2010 3:24 PM  Additional Follow-up for Phone Call Additional follow up Details #1::        Verbal order relayed. Additional Follow-up by: Delilah Shan CMA Duncan Dull),  April 10, 2010 5:32 PM

## 2010-08-05 NOTE — Progress Notes (Signed)
  Phone Note Call from Patient Call back at [phone]   Caller: Lonni Fix Long-daughter Call For: Dr.Duncan Summary of Call: Pt's daughter called to r/s pt's lab appt. to 05/26/10.  Pt.was suppose to come before he takes his medication,but his daughter says he takes the medication @ 6am.  Please advise. Initial call taken by: Beau Fanny,  May 21, 2010 3:29 PM  Follow-up for Phone Call        hold AM meds for day of lab draw and then take them after the sample is collected.  resume regular dosing after that.  Follow-up by: Crawford Givens MD,  May 21, 2010 8:10 PM  Additional Follow-up for Phone Call Additional follow up Details #1::        Daughter advised.  Lugene Fuquay CMA Duncan Dull)  May 22, 2010 11:03 AM

## 2010-08-05 NOTE — Miscellaneous (Signed)
Summary: Power Of [employer]  Power Of Attorney   Imported By: Cala Bradford Mesiemore 03/03/2010 11:27:23  _____________________________________________________________________  External Attachment:    Type:   Image     Comment:   External Document

## 2010-08-05 NOTE — Medication Information (Signed)
Summary: Coumadin Clinic  Anticoagulant Therapy  Managed by: Inactive Referring MD: Jens Som MD, Arlys John PCP: Dr. Timoteo Gaul, [facility] in [place] MD: Graciela Husbands MD, Viviann Spare Indication 1: Atrial Fibrillation (ICD-427.31) Lab Used: [facility] [place] Site: [address] PT 29.6 INR POC 2.84 INR RANGE 2.0-3.0  Dietary changes: no    Health status changes: no    Bleeding/hemorrhagic complications: no    Recent/future hospitalizations: no    Any changes in medication regimen? no    Recent/future dental: no  Any missed doses?: no       Is patient compliant with meds? yes      Comments: lab received from 08/15/09 Pending DCCV on 08/23/09,  Pt. followed at [facility] per spouse and D. Betsey Holiday, Charity fundraiser. Thus made inactive  Allergies: No Known Drug Allergies  Anticoagulation Management History:      His anticoagulation is being managed by telephone today.  Positive risk factors for bleeding include an age of 70 years or older and history of CVA/TIA.  The bleeding index is 'intermediate risk'.  Positive CHADS2 values include History of HTN and Prior Stroke/CVA/TIA.  Negative CHADS2 values include Age > 70 years old.  The start date was 09/29/2008.  Prothrombin time is 29.6.  Anticoagulation responsible provider: Graciela Husbands MD, Viviann Spare.  INR POC: 2.84.    Anticoagulation Management Assessment/Plan:      The patient's current anticoagulation dose is Warfarin sodium 5 mg tabs: Use as directed by Anticoagulation Clinic.  The target INR is 2 - 3.  Anticoagulation instructions were given to spouse.  Results were reviewed/authorized by Inactive.  He was notified by Bethena Midget, RN, BSN.         Prior Anticoagulation Instructions: 5MG  QD  Current Anticoagulation Instructions: INR 2.84 LMOM for pt. to call for dosing. Bethena Midget, RN, BSN  August 20, 2009 9:10 AM Spoke with pt. wife and she states that pt is taking 5mg s daily except 7mg s on Fridays. He is  aware to continue. Info forward to D. Betsey Holiday, RN Bethena Midget, RN, BSN  August 20, 2009 3:38 PM

## 2010-08-05 NOTE — Miscellaneous (Signed)
Summary: Physician Verbal Order/[facility]  Physician Verbal Order/[facility] [place]   Imported By: Maryln Gottron 05/30/2010 13:53:02  _____________________________________________________________________  External Attachment:    Type:   Image     Comment:   External Document

## 2010-08-05 NOTE — Miscellaneous (Signed)
Summary: Physician Orders/[facility]  Physician Orders/[facility]   Imported By: Lanelle Bal 05/26/2010 08:12:34  _____________________________________________________________________  External Attachment:    Type:   Image     Comment:   External Document

## 2010-08-05 NOTE — Progress Notes (Signed)
Summary: questions about labs  Phone Note Call from Patient Call back at Home Phone [phone]   Caller: Patient Summary of Call: Pt calling regarding his coumadin levels,had labs drawn at another office and have questions about what the pt should be taking Initial call taken by: Judie Grieve,  August 09, 2009 9:14 AM  Follow-up for Phone Call        Spoke with pt's grandchild.  The pt's wife called the office and she is currently not at home.  I asked the pt's grandchild to let her know that our office called and please have her call us when she gets home. Julieta Gutting, RN, BSN  August 09, 2009 9:35 AM

## 2010-08-05 NOTE — Progress Notes (Signed)
Summary: PT DAUGHTER REQUEST CALL  Phone Note Call from Patient Call back at Home Phone [phone] Call back at [phone]   Caller: Spouse/ lARRELLE Reason for Call: Talk to Nurse Initial call taken by: Judie Grieve,  Nov 29, 2009 2:50 PM  Follow-up for Phone Call        spoke with dtr, she is concerned about meds at the rehab facility. spoke with BJ at [facility] [phone]. meds discussed and order sent to facility for amiodarone 200mg  once daily and toprol xl 50mg  once daily and to hold both these meds if heart rate less than 48. Deliah Goody, RN  Nov 29, 2009 4:53 PM

## 2010-08-07 NOTE — Progress Notes (Signed)
Summary: hospice needs orders for wound care  Phone Note From Other Clinic   Caller: Patient Caller: Aggie Cosier with Hospice  [phone] Summary of Call: Nurse with hospice is asking for orders for wound care to be faxed to the hospice office at [phone].  She is also going to order dermacloud.   She wants you to know that she has her doubts that pt is hospice appropriate, that if she doesnt see a definite decline in the next 90 days they will have to withdraw his hospice care. Initial call taken by: Lowella Petties CMA, AAMA,  July 31, 2010 1:19 PM  Follow-up for Phone Call        4x4 to area daily as long as it is draining.  Change to hydrocolloid dressing qweek when drainage decreased.  Wash with soap and water daily.  Use doughnut pillow to take pressure off.  Barrier cream to the rest of the buttock.  Notify MD if increase in size of lesion.  Follow-up by: Crawford Givens MD,  July 31, 2010 1:47 PM  Additional Follow-up for Phone Call Additional follow up Details #1::        Faxed to Hospice nurse. Additional Follow-up by: Delilah Shan CMA Nezzie Manera Dull),  July 31, 2010 3:49 PM

## 2010-08-07 NOTE — Assessment & Plan Note (Signed)
Summary: sore on bottom/alc   Vital Signs:  Patient profile:   70 year old male Height:      68 inches Weight:      206.25 pounds BMI:     31.47 Temp:     98.2 degrees F oral Pulse rate:   88 / minute Pulse rhythm:   regular BP sitting:   96 / 68  (left arm) Cuff size:   large  Vitals Entered By: Delilah Shan CMA  Dull) (July 31, 2010 9:32 AM) CC: Sore on bottom.  Please give order for IFOB. to give to Terri.   History of Present Illness: Prolonged sitting and had some sores on bottom.  The area has been bleeding.  Present for about 1 week. No FCNAV except for 1 day (several days ago) with elevated temp (100.1), resolved now.    He has been more irritable and needed the ativan occ.  He has 90 day trial period with hospice.  Back in [place] and has seen cards.  AF has affected his stamina for walking.    Allergies: No Known Drug Allergies  Review of Systems       See HPI.  Otherwise negative.    Physical Exam  General:  no apparent distress in w/c with contractures from cva IRR, not tachy lungs clear to auscultation bilaterally L buttock with thickened skin about 2cm wide, but w/o erythema.  Centrally there is an area  ~1/2cm that is oozing some blood.  No pus expressed.     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  See hospice wound care instructions.  If increase in size, then follow up here.  This doesn't appear infected and doesn't need I&D.  He and daughter agree.  I talked with them about hospice.  He is in a trial period.  He may not qualify for ongoing care.    Complete Medication List: 1)  Warfarin Sodium 5 Mg Tabs (Warfarin sodium) .... Patient takes 5mg  daily except 2.5 mg. on sunday 2)  Aspirin 81 Mg Tbec (Aspirin) .... Take one tablet by mouth daily 3)  Lorazepam 0.5 Mg Tabs (Lorazepam) .... Every 6 hours as  needed 4)  Vitamin D3 1000 Iu Qd  .Marland Kitchen.. 1 tab by mouth once daily 5)  Fish Oil 1000 Mg Caps (Omega-3 fatty acids) .... Take 1 capsule by mouth once a day 6)  Crestor 20 Mg Tabs (Rosuvastatin calcium) .Marland Kitchen.. 1 every day 7)  Carbamazepine 200 Mg Tabs (Carbamazepine) .... 1.5 tab by mouth bid 8)  Furosemide 20 Mg Tabs (Furosemide) .... Take one tablet by mouth daily. 9)  Amiodarone Hcl 200 Mg Tabs (Amiodarone hcl) .Marland Kitchen.. 1 once daily 10)  Lexapro 10 Mg Tabs (Escitalopram oxalate) .... Take 1 tablet by mouth once a day 11)  Klor-con 10 10 Meq Cr-tabs (Potassium chloride) .Marland Kitchen.. 1 by mouth once daily  Patient Instructions: 1)  Keep the March appointment.  We can talk about checking your stool for blood then.  Use the doughnut pillow in the meantime and have hospice check the wound.  Let me know if it is getting bigger.  I would wash it with soap and water during a shower and change the bandage if soiled.  Use barrier cream on your bottom.  2)  Let me know if you have other concerns.    Orders Added: 1)  Est. Patient Level III [16109]    Current Allergies (reviewed today): No known allergies

## 2010-08-07 NOTE — Miscellaneous (Signed)
Summary: Physician's Order/[facility] [place]  Physician's Order/[facility] [place]   Imported By: Maryln Gottron 07/10/2010 15:32:56  _____________________________________________________________________  External Attachment:    Type:   Image     Comment:   External Document

## 2010-08-07 NOTE — Progress Notes (Signed)
Summary: reporting PT/INR  Phone Note From Other Clinic   Caller: Lucita Ferrara, nurse with [facility] [phone] Summary of Call: Home health nurse called to report that pt's Pt is 27.7, INR is 2.8.  His current coumadin dose is 5 mg's monday through saturday and 2.5 mg's on sunday.  Also, pt's daughter will be coming by to pick up a stool test for the pt. Initial call taken by: Lowella Petties CMA, AAMA,  July 23, 2010 9:26 AM  Follow-up for Phone Call        No change in dose.  recheck INR in 4 weeks with result to be called in.  Please enter INR on flowseet. thanks.  Follow-up by: Crawford Givens MD,  July 23, 2010 12:26 PM  Additional Follow-up for Phone Call Additional follow up Details #1::        Left message on voicemail  in detail.  Personalized VM of Lucita Ferrara.  Added to flowsheet.  Additional Follow-up by: Delilah Shan CMA Ewin Rehberg Dull),  July 23, 2010 4:35 PM

## 2010-08-07 NOTE — Progress Notes (Signed)
Summary: pt fell yesterday  Phone Note Call from Patient   Caller: Aggie Cosier with Hospice  [phone] Summary of Call: Pt fell yesterday while he was alone, he has a fractured right wrist, fractured nose and UTI.  He went to the hospital, but nurse is not sure which one.  She got this information from a call pt's daughter made to on call hospice nurse last night.  They checked his protime last night at the hospital and the INR was 3.2.  Pt is back home now. Initial call taken by: Lowella Petties CMA, AAMA,  August 01, 2010 8:36 AM  Follow-up for Phone Call        please get records, esp re: antibiotics and potential changes to coumadin dose.  Follow-up by: Crawford Givens MD,  August 01, 2010 8:42 AM  Additional Follow-up for Phone Call Additional follow up Details #1::        Follow up call from pt's daughter [name].  She said pt was given bactrim for UTI, norco for pain.  She will wait to her from you regarding coumadin dose.   Her call back number is [phone]- her work number.      Lowella Petties CMA, AAMA  August 01, 2010 10:20 AM     Additional Follow-up for Phone Call Additional follow up Details #2::    decrease coumadin to 2.5mg  a day and recheck INR in 1 week.  Please find out duration of the antibiotics.  thanks. Crawford Givens MD  August 01, 2010 10:50 AM   [name] advised.  She says he will be on ABX twice daily x 10 days.  Lugene Fuquay CMA (AAMA)  August 01, 2010 11:08 AM   Noted. Will check INR in one week and then make decisions on coumadin from that point forward.  Please update the med list with antibiotics and coumadin change.  Crawford Givens MD  August 01, 2010 11:14 AM   New/Updated Medications: WARFARIN SODIUM 5 MG TABS (WARFARIN SODIUM) Take 2.5 mg. once daily while on antibiotic, await further instructions after next INR in 1 week. BACTRIM 400-80 MG TABS (SULFAMETHOXAZOLE-TRIMETHOPRIM) Take 1 tablet by mouth two times a day x 10 days NORCO 10-325 MG TABS  (HYDROCODONE-ACETAMINOPHEN)

## 2010-08-07 NOTE — Progress Notes (Signed)
Summary: daughter wants hospice referral  Phone Note From Other Clinic   Caller: Stanton Kidney with hospice [phone] Summary of Call: Pt's daughter is requesting a hospice referral. Stanton Kidney is asking if pt would qualify. Daughter basically wants someone to be there with him while she is at work, Stanton Kidney said hospice cant do that but can check on him a couple of times a week. Initial call taken by: Lowella Petties CMA, AAMA,  July 17, 2010 2:55 PM  Follow-up for Phone Call        Please send recent OV notes and demographics along with recent labs to fax number below.  please send order for Hospice assessment to [phone], Attn: Hospice.  thanks.  Follow-up by: Crawford Givens MD,  July 17, 2010 3:26 PM  Additional Follow-up for Phone Call Additional follow up Details #1::        Faxed. Additional Follow-up by: Delilah Shan CMA (AAMA),  July 17, 2010 4:12 PM

## 2010-08-07 NOTE — Miscellaneous (Signed)
Summary: Wound Care Orders/[facility]  Wound Care Orders/[facility]   Imported By: Lanelle Bal 07/01/2010 15:38:56  _____________________________________________________________________  External Attachment:    Type:   Image     Comment:   External Document

## 2010-08-07 NOTE — Progress Notes (Signed)
Summary: K+ check  Phone Note Call from Patient   Caller: Daughter, Lonni Fix  [phone] Call For: Crawford Givens MD Summary of Call: Daughter wants to know when he is supposed to have his K+ checked again. Initial call taken by: Delilah Shan CMA Chaselynn Kepple Dull),  July 16, 2010 5:16 PM  Follow-up for Phone Call        I would start taking Potassium as instructed per UC.  I would recheck K+ in 1 month, dx. 276.8.  Also recheck hgb and IFOB in 1 month (at the same time), dx. 285.9.  If either is abnormal, that would direct work up.  I wouldn't do anything else (for labs/meds) in the meantime.  Notify the clinic if any blood seen in stools in meantime. Follow-up by: Crawford Givens MD  July 17, 2010 10:54 PM  Additional Follow-up for Phone Call Additional follow up Details #1::        Larrelle notified.  She says she is going to try to get [facility] to draw the blood for K+ and hgb and she will ask about the IFOB kit from them.  Lugene Fuquay CMA (AAMA)  July 18, 2010 10:13 AM

## 2010-08-07 NOTE — Assessment & Plan Note (Signed)
Summary: 6 mo f/u ./cy   Referring Provider:  Dr. Jens Som Primary Provider:  Dr. Timoteo Gaul, [facility] in [place]  CC:  no complaints.  History of Present Illness: Darrell Hayes returns for followup today.  He has a history of atrial fibrillation that is paroxysmal.  He also had a previous stroke related to this and is on chronic Coumadin. Last echocardiogram was performed on September 18, 2008. LV function was normal. Trivial aortic insufficiency and mild left atrial enlargement. He underwent cardiac catheterization on September 26, 2008.  There was a 90% stenosis in the LAD proper.  There was also an 80% stenosis in the distal LAD.  There was no other significant disease noted.  His ejection fraction was 55%.  The patient subsequently underwent PCI of the mid LAD lesion with a bare-metal stent.  There was a CT scan performed on September 10, 2008, that showed no pulmonary embolus.  There were reticular nodular opacities in the superior segment of the right lower lobe felt possibly secondary to an infectious or inflammatory process.  Pulmonary is now following this. He is also status post cardioversion on amiodarone. I last saw him in June 2011. Since then he occasionally has some dyspnea on exertion but no orthopnea, PND, pedal edema, palpitations, syncope or chest pain. He does fall occasionally from losing his balance.  Current Medications (verified): 1)  Warfarin Sodium 5 Mg Tabs (Warfarin Sodium) .... Patient Takes 5mg  Daily Except 2.5 Mg. On Sunday 2)  Aspirin 81 Mg Tbec (Aspirin) .... Take One Tablet By Mouth Daily 3)  Lorazepam 0.5 Mg Tabs (Lorazepam) .... Every 6 Hours As Needed 4)  Vitamin D3 1000 Iu Qd .Marland Kitchen.. 1 Tab By Mouth Once Daily 5)  Fish Oil 1000 Mg Caps (Omega-3 Fatty Acids) .... Take 1 Capsule By Mouth Once A Day 6)  Crestor 20 Mg Tabs (Rosuvastatin Calcium) .Marland Kitchen.. 1 Every Day 7)  Carbamazepine 200 Mg Tabs (Carbamazepine) .... 1.5 Tab By Mouth Bid 8)  Furosemide 20 Mg Tabs (Furosemide)  .... Take One Tablet By Mouth Daily. 9)  Amiodarone Hcl 200 Mg Tabs (Amiodarone Hcl) .Marland Kitchen.. 1 Once Daily 10)  Lexapro 10 Mg Tabs (Escitalopram Oxalate) .... Take 1 Tablet By Mouth Once A Day 11)  Klor-Con 10 10 Meq Cr-Tabs (Potassium Chloride) .Marland Kitchen.. 1 By Mouth Once Daily  Allergies: No Known Drug Allergies  Past History:  Past Medical History: #HYPERCHOLESTEROLEMIA, PURE (ICD-272.0) #HYPERTENSION (ICD-401.9) #COUMADIN THERAPY (ICD-V58.61) #Atrial Fibrillation .Marland KitchenMarland KitchenMarland KitchenDr Doreatha Lew  - s/p cardioversion 08/23/2009  - s/p amiodarone Rx since 09/03/2009 #CVA in the setting of Atrial Fib in 2007 #Hiatal Hernia #CAD ...........Marland KitchenDr Jens Som  - s/p Bare Metal stent to LAD March 2010  - Normal LV function on Cath March 2010 and normal BNP Oc 2010 #New onset Seizures June 2010.Marland KitchenMarland KitchenMarland KitchenDr Sharene [facility] NODULES  -Non specific reticulonodular nodules RLL superior segment March 2010  - REsolved on CT 06/14/09   - No further followup #Unspec alveolar pneumonoapathy  - 06/14/09 CT CHEst: Mild LLL GGO   - 02/28/2010 CT chest: improved #HX OF TOBACCO ABUSE #DYSPNEA - Class 3  - Since 2008.  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 Appendectomy Stent placed in 09/2008  Social History: Reviewed history from 04/08/2010 and no changes required. Retired Education administrator from [employer] Married, separated Lives with daughter-Darrell Hayes  [phone] who works first shift  as does her husband.   2 children Tobacco Use - Yes. Smoked pipe-quit 2007, used pipe x 25yrs , started in 1980 Alcohol Use - no Regular Exercise - no ambulation with cane or walker Has been disabled since his stroke.  Review of Systems       no fevers or chills, productive cough, hemoptysis, dysphasia, odynophagia, melena, hematochezia, dysuria, hematuria, rash, seizure activity, orthopnea, PND, pedal edema, claudication. Remaining systems are negative.   Vital Signs:  Patient profile:   70 year old male Height:      68 inches Weight:      208 pounds BMI:     31.74 Pulse rate:   106 / minute Resp:     16 per minute BP sitting:   123 / 83  (left arm)  Vitals Entered By: Kem Parkinson (June 19, 2010 10:54 AM)  Physical Exam  General:  Well-developed well-nourished in no acute distress.  Skin is warm and dry.  HEENT is normal.  Neck is supple. No thyromegaly.  Chest is clear to auscultation with normal expansion.  Cardiovascular exam is irregular Abdominal exam nontender or distended. No masses palpated. Extremities show trace edema. neuro residual deficits from prior CVA.    EKG  Procedure date:  06/19/2010  Findings:      Atrial flutter with PVCs or aberrantly conducted beats.  Impression & Recommendations:  Problem # 1:  ATRIAL FIBRILLATION (ICD-427.31) Patient is now in atrial flutter. His heart rate is controlled. His symptoms have not changed. He may be having paroxysmal atrial arrhythmias. We will continue with his amiodarone and Coumadin for now. We will consider cardioversion in the future if he has worsening symptoms. Note he is following. However we cannot discontinue his Coumadin as he has had an embolic CVA previously. TSH and liver functions are monitored by primary care. Patient had CAT scan of the chest in August of 2011 and is being followed by pulmonary. His updated medication list for this problem includes:    Warfarin Sodium 5 Mg Tabs  (Warfarin sodium) .Marland Kitchen... Patient takes 5mg  daily except 2.5 mg. on sunday    Aspirin 81 Mg Tbec (Aspirin) .Marland Kitchen... Take one tablet by mouth daily    Amiodarone Hcl 200 Mg Tabs (Amiodarone hcl) .Marland Kitchen... 1 once daily  Orders: EKG w/ Interpretation (93000)  Problem # 2:  CVA (ICD-434.91)  His updated medication list for this problem includes:    Warfarin Sodium 5 Mg Tabs (Warfarin sodium) .Marland Kitchen... Patient takes 5mg  daily except 2.5 mg. on sunday    Aspirin 81 Mg Tbec (Aspirin) .Marland Kitchen... Take one tablet by mouth daily  Problem # 3:  COUMADIN THERAPY (ICD-V58.61) Goal INR 2-3.  Problem # 4:  HYPERTENSION (ICD-401.9) Blood pressure controlled on present medications. Potassium and renal function monitored by primary care. His updated medication list for this problem includes:    Aspirin 81 Mg Tbec (Aspirin) .Marland Kitchen... Take one tablet by mouth daily    Furosemide 20 Mg Tabs (Furosemide) .Marland Kitchen... Take one tablet by mouth daily.  Problem # 5:  HYPERCHOLESTEROLEMIA, PURE (ICD-272.0) Continue statin. Lipids and liver monitored by primary care. His updated medication  list for this problem includes:    Crestor 20 Mg Tabs (Rosuvastatin calcium) .Marland Kitchen... 1 every day  Problem # 6:  CAD (ICD-414.00) Continued aspirin and statin. His updated medication list for this problem includes:    Warfarin Sodium 5 Mg Tabs (Warfarin sodium) .Marland Kitchen... Patient takes 5mg  daily except 2.5 mg. on sunday    Aspirin 81 Mg Tbec (Aspirin) .Marland Kitchen... Take one tablet by mouth daily  Problem # 7:  COMPUTERIZED TOMOGRAPHY, CHEST, ABNORMAL (ICD-793.1) Management per pulmonary.  Patient Instructions: 1)  Your physician recommends that you schedule a follow-up appointment in: 3 months with Dr. Jens Som 2)  Your physician recommends that you continue on your current medications as directed. Please refer to the Current Medication list given to you today.

## 2010-08-07 NOTE — Miscellaneous (Signed)
Summary: Care Plan/[facility]  Care Plan/[facility]   Imported By: Lanelle Bal 06/19/2010 15:07:05  _____________________________________________________________________  External Attachment:    Type:   Image     Comment:   External Document

## 2010-08-07 NOTE — Progress Notes (Signed)
Summary: please review urgent care notes  Phone Note Call from Patient Call back at Home Phone [phone]   Caller: Lonni Fix, daughter  Summary of Call: [facility].  They are advising that pt start potassium. Also, hgb was a little low. Daughter is asking that you review their notes and pt's lab results and advise on what you think. She knows she will not here from [place] before monday. Initial call taken by: Lowella Petties CMA, AAMA,  June 13, 2010 3:44 PM  Follow-up for Phone Call        I would start taking potassium as instructed per UC.  I would recheck K in 1 month, dx 276.8.  Also recheck hgb and IFOB in 1 month (at the same time), dx 285.9. If either is abnormal, that would direct work up.  I wouldn't do anything else (for labs/meds) in the meantime.  Notify the clinic if any blood seen in stools in meantime.  Follow-up by: Crawford Givens MD,  June 15, 2010 8:01 PM  Additional Follow-up for Phone Call Additional follow up Details #1::        Left message on voicemail  to return call.  Lugene Fuquay CMA (AAMA)  June 16, 2010 2:10 PM   Larrelle did not get the K+ Rx. from UC because she wanted to talk to you before starting him on it. She will call back later to schedule the lab appt. in 1 month.  She had to look at her calendar and she was driving at the time.    She also is asking for something to put on his bottom.  She has been using A & D Ointment but he has 2 sores.  She says she needs a Rx. medication to get these cleared up.  Both Rx.'s need to be sent to [facility] in [place] Additional Follow-up by: Delilah Shan CMA Duncan Dull),  June 16, 2010 3:35 PM    Additional Follow-up for Phone Call Additional follow up Details #2::    He needs to have the the sores checked before we start meds on them.  Depending on how they look, they may need different meds.  I sent in the rx for the potassium.  thanks.  Follow-up by: Crawford Givens MD,   June 16, 2010 5:18 PM  Additional Follow-up for Phone Call Additional follow up Details #3:: Details for Additional Follow-up Action Taken: Larrelle advised. Additional Follow-up by: Delilah Shan CMA (AAMA),  June 17, 2010 9:24 AM  New/Updated Medications: KLOR-CON 10 10 MEQ CR-TABS (POTASSIUM CHLORIDE) 1 by mouth once daily Prescriptions: KLOR-CON 10 10 MEQ CR-TABS (POTASSIUM CHLORIDE) 1 by mouth once daily  #30 x 3   Entered and Authorized by:   Crawford Givens MD   Signed by:   Crawford Givens MD on 06/16/2010   Method used:   Electronically to        [facility]. # [id number]* (retail)       [address]       [place], [place]  [address]       Ph: [phone]       Fax: [phone]   RxID:   [id number]

## 2010-08-07 NOTE — Medication Information (Signed)
Summary: Interaction/[facility]  Interaction/[facility]   Imported By: Lester Big Pool 06/17/2010 08:18:15  _____________________________________________________________________  External Attachment:    Type:   Image     Comment:   External Document

## 2010-08-07 NOTE — Progress Notes (Signed)
Summary: PT/INR  Phone Note Call from Patient   Caller: Crystal- [facility]-  Call For: Crawford Givens MD Summary of Call: Patient's PT was 27.3 and INR was 2.7. Please advise.  Initial call taken by: Melody Comas,  June 24, 2010 1:54 PM  Follow-up for Phone Call        no change in meds.  please verify dose and update med list. recheck INR in 4 weeks.  dx 427.31.  Follow-up by: Crawford Givens MD,  June 24, 2010 2:02 PM  Additional Follow-up for Phone Call Additional follow up Details #1::        Crystal advised.  Dose is 5 mg. daily except 2.5 mg. on Sunday.  Order faxed for lab draw in 4 weeks. Additional Follow-up by: Delilah Shan CMA Emireth Cockerham Dull),  June 25, 2010 8:58 AM

## 2010-08-07 NOTE — Miscellaneous (Signed)
Summary: Physician's Orders/[facility]   Physician's Orders/[facility]   Imported By: Maryln Gottron 07/28/2010 09:14:46  _____________________________________________________________________  External Attachment:    Type:   Image     Comment:   External Document

## 2010-08-07 NOTE — Letter (Signed)
Summary: Generic Letter   at [facility]  [address] [place], [place] [address]   Phone: [phone]  Fax: [phone]    06/25/2010       Re:   Darrell Hayes   [address]   [place], [place]  [address] DOB:  1941/07/03      Please recheck INR in 4 weeks.  dx 427.31.    Please report current Warfarin dose when INR is reported to physician.  Thank you.    Dwana Curd Para March, M.D.  [facility]

## 2010-08-07 NOTE — Progress Notes (Signed)
Summary: Hospice Palliative Care  Phone Note Call from Patient   Caller: Daughter, [name] Call For: Crawford Givens MD Summary of Call: Daughter is asking if you would fax her an order for Hospice to evaluate the patient for Palliative Care.  Sometime next week is fine.  Daughter's fax number is [phone]. Attention [name] Long. Initial call taken by: Delilah Shan CMA Duncan Dull),  July 04, 2010 2:25 PM  Follow-up for Phone Call        Usually anyone can initiate a hospice referral (it doesn't have to always come from a doc).  I would have her call the hospice agency so they can coordinate their schedules.  Let me know if they need other papers from me.  Thanks.  Follow-up by: Crawford Givens MD,  July 04, 2010 2:28 PM  Additional Follow-up for Phone Call Additional follow up Details #1::        Daughter advised. Additional Follow-up by: Delilah Shan CMA Duncan Dull),  July 04, 2010 3:05 PM

## 2010-08-08 ENCOUNTER — Encounter: Payer: Self-pay | Admitting: Family Medicine

## 2010-08-13 ENCOUNTER — Encounter: Payer: Self-pay | Admitting: Family Medicine

## 2010-08-13 NOTE — Miscellaneous (Signed)
Summary: Order/[facility]  Order/[facility]   Imported By: Sherian Rein 08/05/2010 14:35:40  _____________________________________________________________________  External Attachment:    Type:   Image     Comment:   External Document

## 2010-08-13 NOTE — Miscellaneous (Signed)
Summary: Hospice Notification of Admission  Hospice Notification of Admission   Imported By: Kassie Mends 08/08/2010 09:48:32  _____________________________________________________________________  External Attachment:    Type:   Image     Comment:   External Document

## 2010-08-14 ENCOUNTER — Encounter: Payer: Self-pay | Admitting: Family Medicine

## 2010-08-15 ENCOUNTER — Encounter: Payer: Self-pay | Admitting: Family Medicine

## 2010-08-19 ENCOUNTER — Encounter: Payer: Self-pay | Admitting: Family Medicine

## 2010-08-21 NOTE — Miscellaneous (Signed)
Summary: [facility] Discharge Instruction Sheet  [facility] Discharge Instruction Sheet   Imported By: Kassie Mends 08/14/2010 08:08:49  _____________________________________________________________________  External Attachment:    Type:   Image     Comment:   External Document

## 2010-08-21 NOTE — Miscellaneous (Signed)
Summary: Hospice  Hospice   Imported By: Kassie Mends 08/11/2010 11:33:16  _____________________________________________________________________  External Attachment:    Type:   Image     Comment:   External Document

## 2010-08-21 NOTE — Miscellaneous (Signed)
Summary: [facility] Episode Summary Report  [facility] Episode Summary Report   Imported By: Kassie Mends 08/15/2010 09:52:24  _____________________________________________________________________  External Attachment:    Type:   Image     Comment:   External Document

## 2010-08-25 ENCOUNTER — Encounter: Payer: Self-pay | Admitting: Family Medicine

## 2010-08-26 ENCOUNTER — Encounter: Payer: Self-pay | Admitting: Family Medicine

## 2010-08-26 ENCOUNTER — Telehealth: Payer: Self-pay | Admitting: Family Medicine

## 2010-08-27 ENCOUNTER — Encounter: Payer: Self-pay | Admitting: Family Medicine

## 2010-08-27 NOTE — Miscellaneous (Signed)
Summary: Hospice Plan Of Treatment  Hospice Plan Of Treatment   Imported By: Kassie Mends 08/22/2010 11:30:09  _____________________________________________________________________  External Attachment:    Type:   Image     Comment:   External Document

## 2010-08-27 NOTE — Miscellaneous (Signed)
Summary: [facility] Physician Verbal Order   [facility] Physician Verbal Order   Imported By: Kassie Mends 08/19/2010 08:56:42  _____________________________________________________________________  External Attachment:    Type:   Image     Comment:   External Document

## 2010-09-02 NOTE — Progress Notes (Signed)
Summary: Cough  Phone Note Call from Patient Call back at [facility] Phone [phone]   Caller: Daughter, Lonni Fix  [phone] Call For: Crawford Givens MD Summary of Call: Daughter says that Mr. Hewitt has a really deep-sounding cough.  She is concerned about it but the Hospice nurse did not make any suggestion.  Can she give him anything OTC for the cough?  Follow-up for Phone Call        You can try otc (plain mucinex) and drink plenty of water.  If this isn't getting better, certainly if getting worse, then he needs OV.  thanks. Crawford Givens MD  August 26, 2010 3:59 PM   Daughter advised.  Lugene Fuquay CMA Duncan Dull)  August 26, 2010 4:27 PM

## 2010-09-02 NOTE — Miscellaneous (Signed)
Summary: Orders/[facility]  Orders/[facility]   Imported By: Lanelle Bal 08/25/2010 15:29:45  _____________________________________________________________________  External Attachment:    Type:   Image     Comment:   External Document

## 2010-09-02 NOTE — Miscellaneous (Signed)
Summary: [facility]  [facility] Draw [name]   Imported By: Lanelle Bal 08/28/2010 13:02:31  _____________________________________________________________________  External Attachment:    Type:   Image     Comment:   External Document

## 2010-09-04 ENCOUNTER — Encounter: Payer: Self-pay | Admitting: Family Medicine

## 2010-09-08 ENCOUNTER — Encounter: Payer: Self-pay | Admitting: Family Medicine

## 2010-09-09 ENCOUNTER — Encounter: Payer: Self-pay | Admitting: Family Medicine

## 2010-09-10 ENCOUNTER — Encounter (INDEPENDENT_AMBULATORY_CARE_PROVIDER_SITE_OTHER): Payer: Self-pay | Admitting: *Deleted

## 2010-09-11 NOTE — Miscellaneous (Signed)
Summary: Hospice Plan of Care Review  Hospice Plan of Care Review   Imported By: Kassie Mends 09/03/2010 08:43:56  _____________________________________________________________________  External Attachment:    Type:   Image     Comment:   External Document

## 2010-09-11 NOTE — Miscellaneous (Signed)
Summary: Physician's Interim Order/[facility]  Physician's Interim Order/[facility]   Imported By: Maryln Gottron 09/01/2010 15:49:44  _____________________________________________________________________  External Attachment:    Type:   Image     Comment:   External Document

## 2010-09-15 ENCOUNTER — Ambulatory Visit (INDEPENDENT_AMBULATORY_CARE_PROVIDER_SITE_OTHER): Payer: Medicare Other | Admitting: Family Medicine

## 2010-09-15 ENCOUNTER — Ambulatory Visit (INDEPENDENT_AMBULATORY_CARE_PROVIDER_SITE_OTHER): Payer: Medicare Other | Admitting: Cardiology

## 2010-09-15 ENCOUNTER — Encounter: Payer: Self-pay | Admitting: Cardiology

## 2010-09-15 ENCOUNTER — Encounter: Payer: Self-pay | Admitting: Family Medicine

## 2010-09-15 ENCOUNTER — Other Ambulatory Visit: Payer: Self-pay | Admitting: Cardiology

## 2010-09-15 ENCOUNTER — Ambulatory Visit: Payer: Self-pay | Admitting: Family Medicine

## 2010-09-15 DIAGNOSIS — I4891 Unspecified atrial fibrillation: Secondary | ICD-10-CM

## 2010-09-15 DIAGNOSIS — L89309 Pressure ulcer of unspecified buttock, unspecified stage: Secondary | ICD-10-CM

## 2010-09-15 DIAGNOSIS — I679 Cerebrovascular disease, unspecified: Secondary | ICD-10-CM

## 2010-09-15 DIAGNOSIS — E7800 Pure hypercholesterolemia, unspecified: Secondary | ICD-10-CM

## 2010-09-15 DIAGNOSIS — L8990 Pressure ulcer of unspecified site, unspecified stage: Secondary | ICD-10-CM

## 2010-09-15 DIAGNOSIS — Z7901 Long term (current) use of anticoagulants: Secondary | ICD-10-CM

## 2010-09-15 DIAGNOSIS — M549 Dorsalgia, unspecified: Secondary | ICD-10-CM | POA: Insufficient documentation

## 2010-09-15 DIAGNOSIS — E876 Hypokalemia: Secondary | ICD-10-CM

## 2010-09-15 DIAGNOSIS — I2510 Atherosclerotic heart disease of native coronary artery without angina pectoris: Secondary | ICD-10-CM

## 2010-09-15 LAB — BASIC METABOLIC PANEL
BUN: 14 mg/dL (ref 6–23)
CO2: 29 mEq/L (ref 19–32)
Calcium: 8.7 mg/dL (ref 8.4–10.5)
Creatinine, Ser: 1.2 mg/dL (ref 0.4–1.5)
GFR: 63.63 mL/min (ref >60.00)
Glucose, Bld: 106 mg/dL — ABNORMAL HIGH (ref 70–99)
Sodium: 137 mEq/L (ref 135–145)

## 2010-09-15 LAB — LIPID PANEL
Cholesterol: 153 mg/dL (ref 0–200)
HDL: 42 mg/dL (ref >39.00)
LDL Cholesterol: 93 mg/dL (ref 0–99)
Total CHOL/HDL Ratio: 4
Triglycerides: 90 mg/dL (ref 0.0–149.0)
VLDL: 18 mg/dL (ref 0.0–40.0)

## 2010-09-15 LAB — CBC WITH DIFFERENTIAL/PLATELET
Basophils Absolute: 0 10*3/uL (ref 0.0–0.1)
Eosinophils Absolute: 0 10*3/uL (ref 0.0–0.7)
Hemoglobin: 13.3 g/dL (ref 13.0–17.0)
Lymphocytes Relative: 24.9 % (ref 12.0–46.0)
MCHC: 34.1 g/dL (ref 30.0–36.0)
Monocytes Relative: 9.8 % (ref 3.0–12.0)
Neutrophils Relative %: 64.1 % (ref 43.0–77.0)
Platelets: 227 10*3/uL (ref 150.0–400.0)
RDW: 16.2 % — ABNORMAL HIGH (ref 11.5–14.6)

## 2010-09-15 LAB — HEPATIC FUNCTION PANEL
Bilirubin, Direct: 0.1 mg/dL (ref 0.0–0.3)
Total Bilirubin: 0.4 mg/dL (ref 0.3–1.2)

## 2010-09-15 LAB — TSH: TSH: 7.17 u[IU]/mL — ABNORMAL HIGH (ref 0.35–5.50)

## 2010-09-16 NOTE — Miscellaneous (Signed)
  Clinical Lists Changes  Medications: Changed medication from WARFARIN SODIUM 5 MG TABS (WARFARIN SODIUM) 5mg  on all days except for 2.5mg  on Monday and Thursday to WARFARIN SODIUM 5 MG TABS (WARFARIN SODIUM) Take 1 tablet by mouth once a day Observations: Added new observation of PT PATIENT: 16.9 s (09/08/2010 14:24) Added new observation of INR: 1.6  (09/08/2010 14:24)

## 2010-09-16 NOTE — Miscellaneous (Signed)
Summary: Physician's Interim Order/[facility]  Physician's Interim Order/[facility]   Imported By: Maryln Gottron 09/10/2010 13:55:41  _____________________________________________________________________  External Attachment:    Type:   Image     Comment:   External Document

## 2010-09-16 NOTE — Letter (Signed)
Summary: Generic Letter  [place] at [facility]  [address] [place], [place] [address]   Phone: [phone]  Fax: [phone]    09/10/2010    Re:   Darrell Hayes   [address]   [place], [place]  [address] DOB:   1940/08/05     INR 1.6.  Change to 5mg  of warfarin on all days and recheck INR in 2 weeks. Thanks.   Dwana Curd Para March, M.D.  [facility]

## 2010-09-22 LAB — CK TOTAL AND CKMB (NOT AT ARMC)
CK, MB: 0.9 ng/mL (ref 0.3–4.0)
Relative Index: INVALID (ref 0.0–2.5)
Total CK: 75 U/L (ref 7–232)

## 2010-09-22 LAB — GLUCOSE, CAPILLARY
Glucose-Capillary: 101 mg/dL — ABNORMAL HIGH (ref 70–99)
Glucose-Capillary: 109 mg/dL — ABNORMAL HIGH (ref 70–99)
Glucose-Capillary: 111 mg/dL — ABNORMAL HIGH (ref 70–99)
Glucose-Capillary: 115 mg/dL — ABNORMAL HIGH (ref 70–99)
Glucose-Capillary: 117 mg/dL — ABNORMAL HIGH (ref 70–99)
Glucose-Capillary: 117 mg/dL — ABNORMAL HIGH (ref 70–99)
Glucose-Capillary: 124 mg/dL — ABNORMAL HIGH (ref 70–99)
Glucose-Capillary: 134 mg/dL — ABNORMAL HIGH (ref 70–99)
Glucose-Capillary: 141 mg/dL — ABNORMAL HIGH (ref 70–99)

## 2010-09-22 LAB — CBC
HCT: 34.3 % — ABNORMAL LOW (ref 39.0–52.0)
HCT: 38.8 % — ABNORMAL LOW (ref 39.0–52.0)
Hemoglobin: 11.5 g/dL — ABNORMAL LOW (ref 13.0–17.0)
Hemoglobin: 11.8 g/dL — ABNORMAL LOW (ref 13.0–17.0)
MCHC: 33.9 g/dL (ref 30.0–36.0)
MCHC: 34.3 g/dL (ref 30.0–36.0)
MCV: 97.6 fL (ref 78.0–100.0)
MCV: 97.8 fL (ref 78.0–100.0)
Platelets: 159 10*3/uL (ref 150–400)
Platelets: 192 10*3/uL (ref 150–400)
RBC: 3.51 MIL/uL — ABNORMAL LOW (ref 4.22–5.81)
RDW: 16 % — ABNORMAL HIGH (ref 11.5–15.5)
RDW: 16.2 % — ABNORMAL HIGH (ref 11.5–15.5)
WBC: 4.8 10*3/uL (ref 4.0–10.5)
WBC: 9.8 10*3/uL (ref 4.0–10.5)

## 2010-09-22 LAB — BASIC METABOLIC PANEL
BUN: 13 mg/dL (ref 6–23)
BUN: 15 mg/dL (ref 6–23)
BUN: 17 mg/dL (ref 6–23)
CO2: 26 mEq/L (ref 19–32)
Calcium: 8.5 mg/dL (ref 8.4–10.5)
Calcium: 8.6 mg/dL (ref 8.4–10.5)
Chloride: 105 mEq/L (ref 96–112)
Creatinine, Ser: 1.37 mg/dL (ref 0.4–1.5)
GFR calc non Af Amer: 51 mL/min — ABNORMAL LOW (ref >60)
GFR calc non Af Amer: 52 mL/min — ABNORMAL LOW (ref >60)
GFR calc non Af Amer: 56 mL/min — ABNORMAL LOW (ref >60)
Glucose, Bld: 105 mg/dL — ABNORMAL HIGH (ref 70–99)
Glucose, Bld: 111 mg/dL — ABNORMAL HIGH (ref 70–99)
Potassium: 3.9 mEq/L (ref 3.5–5.1)
Sodium: 140 mEq/L (ref 135–145)
Sodium: 141 mEq/L (ref 135–145)

## 2010-09-22 LAB — COMPREHENSIVE METABOLIC PANEL
AST: 20 U/L (ref 0–37)
Albumin: 3.6 g/dL (ref 3.5–5.2)
BUN: 13 mg/dL (ref 6–23)
Calcium: 8.5 mg/dL (ref 8.4–10.5)
Creatinine, Ser: 1.45 mg/dL (ref 0.4–1.5)
GFR calc Af Amer: 58 mL/min — ABNORMAL LOW (ref >60)

## 2010-09-22 LAB — CARDIAC PANEL(CRET KIN+CKTOT+MB+TROPI)
CK, MB: 0.6 ng/mL (ref 0.3–4.0)
Total CK: 34 U/L (ref 7–232)
Troponin I: 0.01 ng/mL (ref 0.00–0.06)

## 2010-09-22 LAB — URINE MICROSCOPIC-ADD ON

## 2010-09-22 LAB — DIFFERENTIAL
Basophils Absolute: 0 10*3/uL (ref 0.0–0.1)
Eosinophils Relative: 0 % (ref 0–5)
Lymphocytes Relative: 14 % (ref 12–46)
Lymphs Abs: 1.3 10*3/uL (ref 0.7–4.0)
Monocytes Absolute: 0.9 10*3/uL (ref 0.1–1.0)
Neutro Abs: 7.5 10*3/uL (ref 1.7–7.7)

## 2010-09-22 LAB — URINALYSIS, ROUTINE W REFLEX MICROSCOPIC
Glucose, UA: NEGATIVE mg/dL
Hgb urine dipstick: NEGATIVE
Ketones, ur: NEGATIVE mg/dL
Protein, ur: NEGATIVE mg/dL
pH: 5.5 (ref 5.0–8.0)

## 2010-09-22 LAB — LIPID PANEL
Cholesterol: 91 mg/dL (ref 0–200)
HDL: 42 mg/dL (ref >39)
LDL Cholesterol: 39 mg/dL (ref 0–99)
Total CHOL/HDL Ratio: 2.2 RATIO
VLDL: 10 mg/dL (ref 0–40)

## 2010-09-22 LAB — HEMOGLOBIN A1C: Mean Plasma Glucose: 137 mg/dL — ABNORMAL HIGH (ref <117)

## 2010-09-22 LAB — PROTIME-INR
INR: 1.65 — ABNORMAL HIGH (ref 0.00–1.49)
INR: 1.8 — ABNORMAL HIGH (ref 0.00–1.49)
INR: 2.31 — ABNORMAL HIGH (ref 0.00–1.49)
Prothrombin Time: 20.7 seconds — ABNORMAL HIGH (ref 11.6–15.2)
Prothrombin Time: 24.6 seconds — ABNORMAL HIGH (ref 11.6–15.2)

## 2010-09-22 LAB — CARBAMAZEPINE LEVEL, TOTAL: Carbamazepine Lvl: 10.4 ug/mL (ref 4.0–12.0)

## 2010-09-22 LAB — APTT: aPTT: 33 seconds (ref 24–37)

## 2010-09-23 ENCOUNTER — Telehealth: Payer: Self-pay | Admitting: *Deleted

## 2010-09-23 DIAGNOSIS — Z9181 History of falling: Secondary | ICD-10-CM

## 2010-09-23 DIAGNOSIS — Z7901 Long term (current) use of anticoagulants: Secondary | ICD-10-CM

## 2010-09-23 DIAGNOSIS — I639 Cerebral infarction, unspecified: Secondary | ICD-10-CM

## 2010-09-23 DIAGNOSIS — L89309 Pressure ulcer of unspecified buttock, unspecified stage: Secondary | ICD-10-CM

## 2010-09-23 LAB — BASIC METABOLIC PANEL
BUN: 15 mg/dL (ref 6–23)
CO2: 29 mEq/L (ref 19–32)
Chloride: 102 mEq/L (ref 96–112)
Glucose, Bld: 109 mg/dL — ABNORMAL HIGH (ref 70–99)
Potassium: 3.6 mEq/L (ref 3.5–5.1)

## 2010-09-23 LAB — CBC
HCT: 39.6 % (ref 39.0–52.0)
MCHC: 34.5 g/dL (ref 30.0–36.0)
MCV: 97.8 fL (ref 78.0–100.0)
Platelets: 189 10*3/uL (ref 150–400)
RDW: 16.5 % — ABNORMAL HIGH (ref 11.5–15.5)

## 2010-09-23 NOTE — Telephone Encounter (Signed)
Daughter has been advised about Hospice Discharge. She says she does need help and is asking if you think he qualifies for Home Health. She says she needs to get his PT/INR's done at home because she is at work during the day. She also says that Hospice was providing help with bathing and it has really helped his bottom from breaking down so badly.   

## 2010-09-23 NOTE — Assessment & Plan Note (Signed)
Summary: MARCH FOLLOW UP/[facility]H   Vital Signs:  Patient profile:   70 year old male Height:      68 inches Weight:      203 pounds BMI:     30.98 Temp:     98.5 degrees F oral Pulse rate:   68 / minute Pulse rhythm:   regular BP sitting:   114 / 70  (left arm) Cuff size:   large  Vitals Entered By: Delilah Shan CMA  Dull) (September 15, 2010 10:36 AM) CC: March follow up, Back Pain   History of Present Illness: S/p CVA, saw cards this AM.    On coumadin w/o bleeding.  Not due to INR yet, due next week.  Back pain on L side when in the bed, less painful in the recliner.  Would like to get out of the chair more at night and into the bed to help with skin breakdown.  Using barrier cream.  Had used vicodin rarely.  1-2 pills in a week usually.  We talked about bowel regimen with this.    Hospice is helping.  Has physical therapy at home.  Has brace for R wrist, graduated from cast after a fx.    No sz in years, continued on meds.  Will likely cont follow up with [facility] when Dr. Sharene Skeans changes to peds only.     Allergies: No Known Drug Allergies  Past History:  Past Medical History: Last updated: 09/15/2010 #HYPERCHOLESTEROLEMIA, PURE (ICD-272.0) #HYPERTENSION (ICD-401.9) #COUMADIN THERAPY (ICD-V58.61) #Atrial Fibrillation .Marland KitchenMarland KitchenMarland KitchenDr Doreatha Lew  - s/p cardioversion 08/23/2009  - s/p amiodarone Rx since 09/03/2009 #CVA in the setting of Atrial Fib in 2007 #Hiatal Hernia #CAD ...........Marland KitchenDr Jens Som  - s/p Bare Metal stent to LAD March 2010  - Normal LV function on Cath March 2010 and normal BNP Oc 2010 #New onset Seizures June 2010.Marland KitchenMarland KitchenMarland KitchenDr Sharene Skeans [facility] NODULES  -Non specific reticulonodular nodules RLL superior segment March 2010  - REsolved on CT 06/14/09   - No further followup #Unspec alveolar pneumonoapathy  - 06/14/09 CT CHEst: Mild LLL GGO   - 02/28/2010 CT chest: improved #DYSPNEA - Class 3  - Since 2008.  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   2 children Tobacco Use - Yes. Smoked pipe-quit 2007, used pipe x 16yrs , started in 1980 Alcohol Use - no Regular Exercise - no ambulation with cane or walker Has been disabled since his stroke.  Review of Systems       See HPI.  Otherwise negative.    Physical Exam  General:  no apparent distress in w/c with contractures from cva, R wrist in splint IRR, not tachy lungs clear to auscultation bilaterally L buttock with thickened skin about 2cm wide, but w/o erythema.  no skin breakdown ext w/o edema back w/o midline pain. paraspinal muscles not tender to palpation today in clinic   Impression & Recommendations:  Problem #  1:  CVA (ICD-434.91) No change in meds.  Continue for secondary prevention.   His updated medication list for this problem includes:    Warfarin Sodium 5 Mg Tabs (Warfarin sodium) .Marland Kitchen... Take 1 tablet by mouth once a day    Aspirin 81 Mg Tbec (Aspirin) .Marland Kitchen... 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   Problem # 3:  COUMADIN THERAPY (ICD-V58.61) INR next week.   Problem # 4:  BACK PAIN (ICD-724.5) Will try altered positioning in the bed and chair with pillows as he isn't tender here today.  I would like for him to be out of the chair to  decrease the chace of skin breakdown.  His updated medication list for this problem includes:    Aspirin 81 Mg Tbec (Aspirin) .Marland Kitchen... Take one tablet by mouth daily    Norco 10-325 Mg Tabs (Hydrocodone-acetaminophen) .Marland Kitchen... As needed  Complete Medication List: 1)  Warfarin Sodium 5 Mg Tabs (Warfarin sodium) .... Take 1 tablet by mouth once a day 2)  Aspirin 81 Mg Tbec (Aspirin) .... Take one tablet by mouth daily 3)  Lorazepam 0.5 Mg Tabs (Lorazepam) .... Every 6 hours as needed 4)  Vitamin D3 1000 Iu Qd  .Marland Kitchen.. 1 tab by mouth once daily 5)  Fish Oil 1000 Mg Caps (Omega-3 fatty acids) .... Take 1 capsule by mouth once a day 6)  Crestor 20 Mg Tabs (Rosuvastatin calcium) .Marland Kitchen.. 1 every day 7)  Carbamazepine 200 Mg Tabs (Carbamazepine) .... 1.5 tab by mouth bid 8)  Furosemide 20 Mg Tabs (Furosemide) .... Take one tablet by mouth daily. 9)  Amiodarone Hcl 200 Mg Tabs (Amiodarone hcl) .Marland Kitchen.. 1 once daily 10)  Lexapro 10 Mg Tabs (Escitalopram oxalate) .... Take 1 tablet by mouth once a day 11)  Klor-con 10 10 Meq Cr-tabs (Potassium chloride) .Marland Kitchen.. 1 by mouth once daily 12)  Norco 10-325 Mg Tabs (Hydrocodone-acetaminophen) .... As needed 13)  Senokot S 8.6-50 Mg Tabs (Sennosides-docusate sodium) .... 2 tabs in the morning then up to 3 times a day as needed 14)  Sorbitol 70 % Soln (Sorbitol) .... 2 tablespoons every two hours as needed  Patient Instructions: 1)  I would try a wedge pillow in the bed and a small pillow for your back in the chair.  See if this helps with the pain in your back.  Don't change you meds in the meantime.   2)  Coumadin draw next week at home. 3)  follow up 3-4 months with OV.   4)  Let me know if you have more skin breakdown in the meantime or if you have other concerns.  Take care.    Orders Added: 1)  Est. Patient Level IV [08657]    Current Allergies (reviewed today): No known allergies

## 2010-09-23 NOTE — Assessment & Plan Note (Signed)
Summary: [email or web address].it   Visit Type:  Follow-up Referring Provider:  Dr. Jens Som Primary Provider:  Dr. Timoteo Gaul, [facility] in [place]   History of Present Illness: Mr. Faircloth returns for followup today.  He has a history of atrial fibrillation that is paroxysmal.  He also had a previous stroke related to this and is on chronic Coumadin. Last echocardiogram was performed on September 18, 2008. LV function was normal. Trivial aortic insufficiency and mild left atrial enlargement. He underwent cardiac catheterization on September 26, 2008.  There was a 90% stenosis in the LAD proper. There was also an 80% stenosis in the distal LAD.  There was no other significant disease noted.  His ejection fraction was 55%.  The patient subsequently underwent PCI of the mid LAD lesion with a bare-metal stent.  There was a CT scan performed on September 10, 2008, that showed no pulmonary embolus.  There were reticular nodular opacities in the superior segment of the right lower lobe felt possibly secondary to an infectious or inflammatory process.  Pulmonary is now following this. He is also status post cardioversion on amiodarone. I last saw him in Dec 2011 and pt was in atrial flutter. Not significantly symptomatic and therefore decided rate control and anticoagulation; patient now place on hospice by primary care. Since I last saw him the patient denies any dyspnea on exertion, orthopnea, PND, pedal edema, palpitations, syncope or chest pain.   Current Medications (verified): 1)  Warfarin Sodium 5 Mg Tabs (Warfarin Sodium) .... Take 1 Tablet By Mouth Once A Day 2)  Aspirin 81 Mg Tbec (Aspirin) .... Take One Tablet By Mouth Daily 3)  Lorazepam 0.5 Mg Tabs (Lorazepam) .... Every 6 Hours As Needed 4)  Vitamin D3 1000 Iu Qd .Marland Kitchen.. 1 Tab By Mouth Once Daily 5)  Fish Oil 1000 Mg Caps (Omega-3 Fatty Acids) .... Take 1 Capsule By Mouth Once A Day 6)  Crestor 20 Mg Tabs (Rosuvastatin Calcium) .Marland Kitchen.. 1 Every Day 7)   Carbamazepine 200 Mg Tabs (Carbamazepine) .... 1.5 Tab By Mouth Bid 8)  Furosemide 20 Mg Tabs (Furosemide) .... Take One Tablet By Mouth Daily. 9)  Amiodarone Hcl 200 Mg Tabs (Amiodarone Hcl) .Marland Kitchen.. 1 Once Daily 10)  Lexapro 10 Mg Tabs (Escitalopram Oxalate) .... Take 1 Tablet By Mouth Once A Day 11)  Klor-Con 10 10 Meq Cr-Tabs (Potassium Chloride) .Marland Kitchen.. 1 By Mouth Once Daily 12)  Norco 10-325 Mg Tabs (Hydrocodone-Acetaminophen) .... As Needed 13)  Senokot S 8.6-50 Mg Tabs (Sennosides-Docusate Sodium) .... 2 Tabs in Am Then Up To 3 Times A Day 14)  Sobertol .... 2 Table Spoons Every Two Hrs As Needed  Allergies (verified): No Known Drug Allergies  Past History:  Past Medical History: #HYPERCHOLESTEROLEMIA, PURE (ICD-272.0) #HYPERTENSION (ICD-401.9) #COUMADIN THERAPY (ICD-V58.61) #Atrial Fibrillation .Marland KitchenMarland KitchenMarland KitchenDr Doreatha Lew  - s/p cardioversion 08/23/2009  - s/p amiodarone Rx since 09/03/2009 #CVA in the setting of Atrial Fib in 2007 #Hiatal Hernia #CAD ...........Marland KitchenDr Jens Som  - s/p Bare Metal stent to LAD March 2010  - Normal LV function on Cath March 2010 and normal BNP Oc 2010 #New onset Seizures June 2010.Marland KitchenMarland KitchenMarland KitchenDr Sharene Skeans [facility] NODULES  -Non specific reticulonodular nodules RLL superior segment March 2010  - REsolved on CT 06/14/09   - No further followup #Unspec alveolar pneumonoapathy  - 06/14/09 CT CHEst: Mild LLL GGO   - 02/28/2010 CT chest: improved #DYSPNEA - Class 3  - Since 2008.  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 Appendectomy Stent placed in 09/2008  Social History: Reviewed history from 04/08/2010 and no changes required. 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   2 children Tobacco Use - Yes. Smoked pipe-quit 2007, used pipe x 54yrs , started in 1980 Alcohol Use - no Regular Exercise - no ambulation with cane or walker Has been disabled since his stroke.  Review of Systems       no fevers or chills, productive cough, hemoptysis, dysphasia, odynophagia, melena, hematochezia, dysuria, hematuria, rash, seizure activity, orthopnea, PND, pedal edema, claudication. Remaining systems are negative.   Vital Signs:  Patient profile:   70 year old male Height:      68 inches Weight:      203 pounds BMI:     30.98 Pulse rate:   61 / minute BP sitting:   135 / 74  (left arm)  Vitals Entered By: Laurance Flatten CMA (September 15, 2010 8:58 AM)  Physical Exam  General:  Well-developed well-nourished in no acute distress.  Skin is warm and dry.  HEENT is normal.  Neck is supple. No thyromegaly.  Chest is clear to auscultation with normal expansion.  Cardiovascular exam is regular rate and rhythm.  Abdominal exam nontender or distended. No masses palpated. Extremities show no edema. neuro grossly intact    EKG  Procedure date:  09/15/2010  Findings:      Sinus rhythm with occasional PVC. No ST changes. First degree AV block.  Impression & Recommendations:  Problem # 1:  OTHER AND UNSPECIFIED HYPERLIPIDEMIA (ICD-272.4)  Continue statin. Check lipids and liver. His updated medication list for this problem includes:    Crestor 20 Mg Tabs (Rosuvastatin calcium) .Marland Kitchen... 1 every day  His updated medication list for this problem includes:    Crestor 20 Mg Tabs (Rosuvastatin calcium) .Marland Kitchen... 1 every  day  Problem # 2:  PULMONARY NODULE (ICD-518.89) Followed by pulmonary. I will not check a chest x-ray on his amiodarone as he sees him for x-rays.  Problem # 3:  CAD (ICD-414.00) Continue aspirin and statin. His updated medication list for this problem includes:    Warfarin Sodium 5 Mg Tabs (Warfarin sodium) .Marland Kitchen... Take 1 tablet by mouth once a day    Aspirin 81 Mg Tbec (Aspirin) .Marland Kitchen... Take one tablet by mouth daily  Problem # 4:  ATRIAL FIBRILLATION (ICD-427.31) Patient now in sinus. Continue amiodarone. Check TSH and liver functions. Continue Coumadin. I would like to discontinue this medicine as he does occasionally fall. However he has had an embolic CVA previously and I do feel the benefit outweighs the risk. His updated medication list for this problem includes:    Warfarin Sodium 5 Mg Tabs (Warfarin sodium) .Marland Kitchen... Take 1 tablet by mouth once a day    Aspirin 81 Mg Tbec (Aspirin) .Marland Kitchen... Take one tablet by mouth daily    Amiodarone Hcl 200 Mg Tabs (Amiodarone hcl) .Marland Kitchen... 1 once daily  Orders: TLB-TSH (Thyroid  Stimulating Hormone) ([id number])  Problem # 5:  COUMADIN THERAPY (ICD-V58.61) Monitored by primary care. Check CBC. Orders: TLB-CBC Platelet - w/Differential ([id number])  Problem # 6:  HYPERTENSION (ICD-401.9) Blood pressure controlled. Check potassium and renal function. His updated medication list for this problem includes:    Aspirin 81 Mg Tbec (Aspirin) .Marland Kitchen... Take one tablet by mouth daily    Furosemide 20 Mg Tabs (Furosemide) .Marland Kitchen... Take one tablet by mouth daily.  Other Orders: TLB-BMP (Basic Metabolic Panel-BMET) ([id number]) TLB-Hepatic/Liver Function Pnl ([id number]) TLB-Lipid Panel ([id number])  Patient Instructions: 1)  Your physician wants you to follow-up in: 8 MONTHS  You will receive a reminder letter in the mail two months in advance. If you don't receive a letter, please call our office to schedule the follow-up appointment.

## 2010-09-23 NOTE — Telephone Encounter (Signed)
Daughter has been advised about Hospice Discharge. She says she does need help and is asking if you think he qualifies for Home Health. She says she needs to get his PT/INR's done at home because she is at work during the day. She also says that Hospice was providing help with bathing and it has really helped his bottom from breaking down so badly.

## 2010-09-24 ENCOUNTER — Encounter: Payer: Self-pay | Admitting: *Deleted

## 2010-09-24 LAB — CBC
Hemoglobin: 13.3 g/dL (ref 13.0–17.0)
MCHC: 34.4 g/dL (ref 30.0–36.0)
RDW: 15.2 % (ref 11.5–15.5)

## 2010-09-24 LAB — BASIC METABOLIC PANEL
CO2: 28 mEq/L (ref 19–32)
Calcium: 8.4 mg/dL (ref 8.4–10.5)
Creatinine, Ser: 1.43 mg/dL (ref 0.4–1.5)
Glucose, Bld: 110 mg/dL — ABNORMAL HIGH (ref 70–99)
Sodium: 137 mEq/L (ref 135–145)

## 2010-09-29 ENCOUNTER — Telehealth: Payer: Self-pay | Admitting: *Deleted

## 2010-09-29 NOTE — Telephone Encounter (Signed)
Okay to check INR on Friday 10/03/10, dx v58.62.  Please send order.  Thanks.

## 2010-09-29 NOTE — Telephone Encounter (Signed)
Call from hospice nurse.  Pt is due to have his protime checked on 4/2 by hospice, but he is being discharged from their services on Friday.  His daughter would like them to check his levels on Friday.  Will that be ok.  Please let nurse know.

## 2010-09-30 NOTE — Telephone Encounter (Signed)
Hospice nurse teresa advised verbal pt/inr to be done on friday

## 2010-10-01 ENCOUNTER — Encounter: Payer: Self-pay | Admitting: Family Medicine

## 2010-10-03 ENCOUNTER — Telehealth: Payer: Self-pay | Admitting: *Deleted

## 2010-10-03 NOTE — Telephone Encounter (Signed)
Hospice nurse called to remind you that pt is being discharged from hospice services today.  She drew pt's protime this morning so we will need to call his home with any instructions or changes regarding that. Also, she says [facility] has an order for home care services, but pt really doesn't need anything right now.

## 2010-10-03 NOTE — Telephone Encounter (Signed)
I will await the INR.

## 2010-10-03 NOTE — Telephone Encounter (Signed)
Stanton Kidney called to let you know that patient is being discharged from Hospice services today. She also wanted you to know that there is no need for skilled nursing to go in, she says that the wound does not require home health. She also says that the order for physical therapy is not needed because the physical therapist had discharged him with goals being met while he was under hospice services.

## 2010-10-03 NOTE — Telephone Encounter (Signed)
Noted  

## 2010-10-06 ENCOUNTER — Telehealth: Payer: Self-pay | Admitting: *Deleted

## 2010-10-06 NOTE — Telephone Encounter (Signed)
Left detailed message on daughter's VM.  Awaiting her call back to see if we need to send the order elsewhere.

## 2010-10-06 NOTE — Telephone Encounter (Signed)
Dr. Ermalene Searing gave order to HOLD Warfarin dose 10/04/2010 and 10/05/2010, and then change to 2.5 mg on MWF and 5 mg on all other days of the week.  Then have Hospice check PT/INR 10/07/2010 and if any bleeding call back.   However, pt was discharged from hospice so this will either need to be done via Home health- or if not possible via home health- then here that the clinic.  Thanks.

## 2010-10-06 NOTE — Telephone Encounter (Signed)
[facility] Triage Call Report Triage Record Num: [id number] Operator: Aundra Millet Patient Name: Darrell Hayes Call Date & Time: 10/04/2010 7:28:28AM Patient Phone: PCP: Sunnie Nielsen ([facility]) Patient Gender: Male PCP Fax : [phone] Patient DOB: 1941/03/05 Practice Name: [facility] Reason for Call: RN returning call to [place] at [place] [phone]) and spoke with Lanora Manis -- lab results obtained 03/302012 at 1135 am per Dr Para March --RESULTS: PT: 66.4 INR: 6.3 - On 09/22/2010 PT: 34.0 INR: 3.2 . RN called pt at number given by lab and reached a VM with a females voice. RN left msg to call office number when msg is received. RN FU ~ 45 mins to try to reach pt again. Pt not found in EPIC. --Daughter/ [name]/ HCPOA returning call and stated that pt takes Warfarin 5 mg one daily for hx of CVA -- last dose 10/03/2010 at 0800 . Pt has no bleeding. RN called Dr Pattricia Boss and she gave order to HOLD Warfarin dose today, 10/04/2010 and tomorrow, 10/05/2010 and then change to 2.5 mg on MWF and 5 mg on all other days of the week, and have Hospice check PT/INR 10/07/2010 and if any bleeding call back. RN discussed orders with [name] and she repeated back instructions and will call office Monday about lab orders. -- RN also returned call to [name] RN with [phone]) who was also calling about lab results and advised him orders per Dr Pattricia Boss. Protocol(s) Used: Office Note Recommended Outcome per Protocol: Information Noted and Sent to Office Reason for Outcome: Caller information to office Care Advice: ~ 10/04/2010 9:00:56AM Page 1 of 1 [id number]

## 2010-10-07 ENCOUNTER — Other Ambulatory Visit (INDEPENDENT_AMBULATORY_CARE_PROVIDER_SITE_OTHER): Payer: Medicare Other | Admitting: Family Medicine

## 2010-10-07 DIAGNOSIS — Z5181 Encounter for therapeutic drug level monitoring: Secondary | ICD-10-CM

## 2010-10-07 DIAGNOSIS — Z7901 Long term (current) use of anticoagulants: Secondary | ICD-10-CM

## 2010-10-07 DIAGNOSIS — I4891 Unspecified atrial fibrillation: Secondary | ICD-10-CM

## 2010-10-07 LAB — POCT INR: INR: 2

## 2010-10-09 ENCOUNTER — Encounter: Payer: Self-pay | Admitting: Family Medicine

## 2010-10-12 LAB — URINALYSIS, ROUTINE W REFLEX MICROSCOPIC
Bilirubin Urine: NEGATIVE
Ketones, ur: NEGATIVE mg/dL
Nitrite: NEGATIVE
Specific Gravity, Urine: 1.017 (ref 1.005–1.030)
Urobilinogen, UA: 1 mg/dL (ref 0.0–1.0)

## 2010-10-12 LAB — CBC
HCT: 40.3 % (ref 39.0–52.0)
Hemoglobin: 13.6 g/dL (ref 13.0–17.0)
MCV: 93.9 fL (ref 78.0–100.0)
RDW: 15 % (ref 11.5–15.5)

## 2010-10-12 LAB — DIFFERENTIAL
Basophils Absolute: 0 10*3/uL (ref 0.0–0.1)
Eosinophils Absolute: 0.1 10*3/uL (ref 0.0–0.7)
Eosinophils Relative: 1 % (ref 0–5)
Lymphocytes Relative: 31 % (ref 12–46)
Monocytes Absolute: 0.7 10*3/uL (ref 0.1–1.0)

## 2010-10-12 LAB — BASIC METABOLIC PANEL
CO2: 27 mEq/L (ref 19–32)
Chloride: 107 mEq/L (ref 96–112)
GFR calc non Af Amer: 60 mL/min — ABNORMAL LOW (ref >60)
Glucose, Bld: 126 mg/dL — ABNORMAL HIGH (ref 70–99)
Potassium: 4.1 mEq/L (ref 3.5–5.1)
Sodium: 140 mEq/L (ref 135–145)

## 2010-10-12 LAB — CARBAMAZEPINE LEVEL, TOTAL: Carbamazepine Lvl: 10.2 ug/mL (ref 4.0–12.0)

## 2010-10-12 LAB — POCT CARDIAC MARKERS
CKMB, poc: 1.1 ng/mL (ref 1.0–8.0)
Troponin i, poc: <0.05 ng/mL (ref 0.00–0.09)

## 2010-10-12 LAB — URINE CULTURE: Colony Count: NO GROWTH

## 2010-10-13 LAB — DIFFERENTIAL
Basophils Absolute: 0 10*3/uL (ref 0.0–0.1)
Lymphocytes Relative: 31 % (ref 12–46)
Monocytes Absolute: 0.8 10*3/uL (ref 0.1–1.0)
Neutro Abs: 4.3 10*3/uL (ref 1.7–7.7)

## 2010-10-13 LAB — COMPREHENSIVE METABOLIC PANEL
Albumin: 3.7 g/dL (ref 3.5–5.2)
BUN: 13 mg/dL (ref 6–23)
Creatinine, Ser: 1.23 mg/dL (ref 0.4–1.5)
Total Bilirubin: 0.4 mg/dL (ref 0.3–1.2)
Total Protein: 7.3 g/dL (ref 6.0–8.3)

## 2010-10-13 LAB — URINALYSIS, ROUTINE W REFLEX MICROSCOPIC
Bilirubin Urine: NEGATIVE
Glucose, UA: NEGATIVE mg/dL
Hgb urine dipstick: NEGATIVE
Specific Gravity, Urine: 1.006 (ref 1.005–1.030)
Urobilinogen, UA: 0.2 mg/dL (ref 0.0–1.0)
pH: 6 (ref 5.0–8.0)

## 2010-10-13 LAB — POCT CARDIAC MARKERS: CKMB, poc: 1.5 ng/mL (ref 1.0–8.0)

## 2010-10-13 LAB — GLUCOSE, CAPILLARY: Glucose-Capillary: 103 mg/dL — ABNORMAL HIGH (ref 70–99)

## 2010-10-13 LAB — CBC
Platelets: 207 10*3/uL (ref 150–400)
RDW: 14.6 % (ref 11.5–15.5)

## 2010-10-13 LAB — TROPONIN I: Troponin I: 0.01 ng/mL (ref 0.00–0.06)

## 2010-10-13 LAB — APTT: aPTT: 38 seconds — ABNORMAL HIGH (ref 24–37)

## 2010-10-13 LAB — CK TOTAL AND CKMB (NOT AT ARMC)
CK, MB: 2.1 ng/mL (ref 0.3–4.0)
Total CK: 77 U/L (ref 7–232)

## 2010-10-13 LAB — PROTIME-INR: INR: 2.1 — ABNORMAL HIGH (ref 0.00–1.49)

## 2010-10-14 ENCOUNTER — Other Ambulatory Visit: Payer: Self-pay | Admitting: *Deleted

## 2010-10-15 MED ORDER — CARBAMAZEPINE 200 MG PO TABS: ORAL_TABLET | ORAL | Status: DC

## 2010-10-16 LAB — HEPARIN LEVEL (UNFRACTIONATED)
Heparin Unfractionated: 0.46 IU/mL (ref 0.30–0.70)
Heparin Unfractionated: 0.5 IU/mL (ref 0.30–0.70)
Heparin Unfractionated: <0.1 IU/mL — ABNORMAL LOW (ref 0.30–0.70)

## 2010-10-16 LAB — MAGNESIUM: Magnesium: 2.2 mg/dL (ref 1.5–2.5)

## 2010-10-16 LAB — PROTIME-INR
INR: 1.4 (ref 0.00–1.49)
INR: 2 — ABNORMAL HIGH (ref 0.00–1.49)
INR: 3.1 — ABNORMAL HIGH (ref 0.00–1.49)
Prothrombin Time: 18 seconds — ABNORMAL HIGH (ref 11.6–15.2)
Prothrombin Time: 34.5 seconds — ABNORMAL HIGH (ref 11.6–15.2)
Prothrombin Time: 21.9 seconds — ABNORMAL HIGH (ref 11.6–15.2)

## 2010-10-16 LAB — BASIC METABOLIC PANEL
BUN: 13 mg/dL (ref 6–23)
BUN: 8 mg/dL (ref 6–23)
CO2: 25 mEq/L (ref 19–32)
CO2: 26 mEq/L (ref 19–32)
CO2: 30 mEq/L (ref 19–32)
Calcium: 8.6 mg/dL (ref 8.4–10.5)
Calcium: 9.1 mg/dL (ref 8.4–10.5)
Chloride: 103 mEq/L (ref 96–112)
Chloride: 104 mEq/L (ref 96–112)
Chloride: 106 mEq/L (ref 96–112)
Chloride: 108 mEq/L (ref 96–112)
Chloride: 108 mEq/L (ref 96–112)
Creatinine, Ser: 1.21 mg/dL (ref 0.4–1.5)
GFR calc Af Amer: >60 mL/min (ref >60)
GFR calc Af Amer: >60 mL/min (ref >60)
GFR calc Af Amer: >60 mL/min (ref >60)
GFR calc Af Amer: >60 mL/min (ref >60)
GFR calc non Af Amer: 57 mL/min — ABNORMAL LOW (ref >60)
GFR calc non Af Amer: 58 mL/min — ABNORMAL LOW (ref >60)
Glucose, Bld: 127 mg/dL — ABNORMAL HIGH (ref 70–99)
Glucose, Bld: 94 mg/dL (ref 70–99)
Potassium: 3.6 mEq/L (ref 3.5–5.1)
Potassium: 3.7 mEq/L (ref 3.5–5.1)
Potassium: 3.7 mEq/L (ref 3.5–5.1)
Potassium: 3.9 mEq/L (ref 3.5–5.1)
Potassium: 4.3 mEq/L (ref 3.5–5.1)
Sodium: 139 mEq/L (ref 135–145)
Sodium: 139 mEq/L (ref 135–145)
Sodium: 139 mEq/L (ref 135–145)

## 2010-10-16 LAB — BLOOD GAS, ARTERIAL
Acid-Base Excess: 3.4 mmol/L — ABNORMAL HIGH (ref 0.0–2.0)
Bicarbonate: 27.3 mEq/L — ABNORMAL HIGH (ref 20.0–24.0)
Patient temperature: 98.6
TCO2: 28.6 mmol/L (ref 0–100)
pCO2 arterial: 41.1 mmHg (ref 35.0–45.0)
pH, Arterial: 7.438 (ref 7.350–7.450)

## 2010-10-16 LAB — DIFFERENTIAL
Basophils Absolute: 0 10*3/uL (ref 0.0–0.1)
Basophils Relative: 0 % (ref 0–1)
Basophils Relative: 0 % (ref 0–1)
Eosinophils Absolute: 0 10*3/uL (ref 0.0–0.7)
Eosinophils Relative: 0 % (ref 0–5)
Eosinophils Relative: 1 % (ref 0–5)
Lymphocytes Relative: 27 % (ref 12–46)
Lymphs Abs: 1.5 10*3/uL (ref 0.7–4.0)
Monocytes Absolute: 0.8 10*3/uL (ref 0.1–1.0)
Monocytes Absolute: 0.7 10*3/uL (ref 0.1–1.0)
Monocytes Relative: 9 % (ref 3–12)

## 2010-10-16 LAB — CBC
HCT: 32.8 % — ABNORMAL LOW (ref 39.0–52.0)
HCT: 33 % — ABNORMAL LOW (ref 39.0–52.0)
HCT: 39.8 % (ref 39.0–52.0)
HCT: 39.8 % (ref 39.0–52.0)
Hemoglobin: 11.4 g/dL — ABNORMAL LOW (ref 13.0–17.0)
Hemoglobin: 11.6 g/dL — ABNORMAL LOW (ref 13.0–17.0)
Hemoglobin: 13.8 g/dL (ref 13.0–17.0)
MCHC: 34.4 g/dL (ref 30.0–36.0)
MCHC: 34.7 g/dL (ref 30.0–36.0)
MCHC: 35.1 g/dL (ref 30.0–36.0)
MCV: 92 fL (ref 78.0–100.0)
MCV: 92.8 fL (ref 78.0–100.0)
MCV: 92.9 fL (ref 78.0–100.0)
MCV: 94.2 fL (ref 78.0–100.0)
MCV: 93.7 fL (ref 78.0–100.0)
Platelets: 201 10*3/uL (ref 150–400)
Platelets: 213 10*3/uL (ref 150–400)
RBC: 3.5 MIL/uL — ABNORMAL LOW (ref 4.22–5.81)
RBC: 3.58 MIL/uL — ABNORMAL LOW (ref 4.22–5.81)
RBC: 4.28 MIL/uL (ref 4.22–5.81)
RBC: 4.25 MIL/uL (ref 4.22–5.81)
RDW: 14.5 % (ref 11.5–15.5)
WBC: 7 10*3/uL (ref 4.0–10.5)
WBC: 7 10*3/uL (ref 4.0–10.5)

## 2010-10-16 LAB — LIPID PANEL
Cholesterol: 94 mg/dL (ref 0–200)
HDL: 23 mg/dL — ABNORMAL LOW (ref >39)
LDL Cholesterol: 55 mg/dL (ref 0–99)

## 2010-10-16 LAB — POCT CARDIAC MARKERS
CKMB, poc: 1.3 ng/mL (ref 1.0–8.0)
Myoglobin, poc: 129 ng/mL (ref 12–200)
Troponin i, poc: <0.05 ng/mL (ref 0.00–0.09)
Troponin i, poc: <0.05 ng/mL (ref 0.00–0.09)

## 2010-10-16 LAB — POCT I-STAT 3, ART BLOOD GAS (G3+)
pCO2 arterial: 39.6 mmHg (ref 35.0–45.0)
pO2, Arterial: 106 mmHg — ABNORMAL HIGH (ref 80.0–100.0)

## 2010-10-16 LAB — COMPREHENSIVE METABOLIC PANEL
AST: 20 U/L (ref 0–37)
Alkaline Phosphatase: 82 U/L (ref 39–117)
BUN: 16 mg/dL (ref 6–23)
CO2: 28 mEq/L (ref 19–32)
Chloride: 98 mEq/L (ref 96–112)
Creatinine, Ser: 1.31 mg/dL (ref 0.4–1.5)
GFR calc Af Amer: >60 mL/min (ref >60)
GFR calc non Af Amer: 55 mL/min — ABNORMAL LOW (ref >60)
Potassium: 3.9 mEq/L (ref 3.5–5.1)
Total Bilirubin: 0.7 mg/dL (ref 0.3–1.2)

## 2010-10-16 LAB — BRAIN NATRIURETIC PEPTIDE
Pro B Natriuretic peptide (BNP): 51 pg/mL (ref 0.0–100.0)
Pro B Natriuretic peptide (BNP): 89 pg/mL (ref 0.0–100.0)
Pro B Natriuretic peptide (BNP): <30 pg/mL (ref 0.0–100.0)

## 2010-10-16 LAB — POCT I-STAT 3, VENOUS BLOOD GAS (G3P V): O2 Saturation: 66 %

## 2010-10-16 LAB — GLUCOSE, CAPILLARY: Glucose-Capillary: 120 mg/dL — ABNORMAL HIGH (ref 70–99)

## 2010-10-16 LAB — POCT I-STAT, CHEM 8
BUN: 18 mg/dL (ref 6–23)
Calcium, Ion: 1.1 mmol/L — ABNORMAL LOW (ref 1.12–1.32)
Chloride: 102 mEq/L (ref 96–112)
Creatinine, Ser: 1.4 mg/dL (ref 0.4–1.5)
TCO2: 25 mmol/L (ref 0–100)

## 2010-10-16 LAB — CARDIAC PANEL(CRET KIN+CKTOT+MB+TROPI)
CK, MB: 1.8 ng/mL (ref 0.3–4.0)
Troponin I: <0.01 ng/mL (ref 0.00–0.06)

## 2010-10-16 LAB — TSH: TSH: 1.907 u[IU]/mL (ref 0.350–4.500)

## 2010-10-16 LAB — SEDIMENTATION RATE: Sed Rate: 16 mm/hr (ref 0–16)

## 2010-10-16 LAB — APTT: aPTT: 55 seconds — ABNORMAL HIGH (ref 24–37)

## 2010-11-14 ENCOUNTER — Telehealth: Payer: Self-pay | Admitting: *Deleted

## 2010-11-14 NOTE — Telephone Encounter (Signed)
Letter done

## 2010-11-14 NOTE — Telephone Encounter (Signed)
Daughter says she needs a letter excusing the patient from jury duty.  She will pick it up at the office Monday.

## 2010-11-14 NOTE — Telephone Encounter (Signed)
Letter left at front desk for pickup.

## 2010-11-14 NOTE — Telephone Encounter (Signed)
Patient's daughter is requesting that Lugene give her a call.  She would not let me help her or tell me the nature of the call.

## 2010-11-18 NOTE — Assessment & Plan Note (Signed)
[facility]                            CARDIOLOGY OFFICE NOTE   NAME:Darrell Hayes                         MRN:          [id number]  DATE:10/23/2008                            DOB:          1940/12/11    [name] returns for followup today.  He has a history of atrial  fibrillation that is paroxysmal.  He also had a previous stroke related  to this and is on chronic Coumadin.  He was admitted to [facility] on September 24, 2008, with complaints of chest pain.  He  ultimately underwent cardiac catheterization on September 26, 2008.  There  was a 90% stenosis in the LAD proper.  There was also an 80% stenosis in  the distal LAD.  There was no other significant disease noted.  His  ejection fraction was 55%.  The patient subsequently underwent PCI of  the mid LAD lesion with a bare-metal stent.  Since discharge, she has  done well with no dyspnea, chest pain, palpitations, or syncope.  Note,  he had been having problems with mild volume excess and dyspnea prior to  admission.  There was a CT scan performed on September 10, 2008, that showed  no pulmonary embolus.  There were reticular nodular opacities in the  superior segment of the right lower lobe felt possibly secondary to an  infectious or inflammatory process.  I did have Pulmonary see the  patient when he was in the hospital and they do not feel further workup  was indicated.  He was asked to follow up with Pulmonary in the near  future.   His medications at present include:  1. Coumadin.  2. Lisinopril 5 mg p.o. daily.  3. Zocor 40 mg p.o. daily.  4. Toprol 50 mg p.o. daily.  5. Aspirin 81 mg p.o. daily.  6. Klonopin.  7. Vitamin D3.  8. Omega-3.  9. Lasix 40 mg p.o. daily.  10.Plavix 75 mg p.o. daily.  11.Senokot.   Physical exam today shows a blood pressure of 103/67 and the pulse is  52.  His HEENT is normal.  His neck is supple.  Chest is clear.  Cardiovascular exam reveals a regular  rate and rhythm.  His abdominal  exam shows no tenderness.  His right groin shows no pulsatile mass, but  there is a bruit.  Extremities show trace edema.   DIAGNOSES:  1. Coronary artery disease status post bare-metal stent to the left      anterior descending in March - the patient will continue on his      aspirin.  He will complete 30 days of Plavix and then discontinue      that medication as he is on Coumadin as well.  He will also      continue on his statin, ACE inhibitor, and beta-blocker.  2. Atrial fibrillation - the patient does have paroxysmal atrial      fibrillation.  He will continue on his beta-blocker.  He is in      sinus rhythm on examination.  He will continue on Coumadin  as he      has a history of a cerebrovascular accident felt secondary to his      atrial fibrillation.  3. Hypertension - his blood pressure is controlled on his present      medications.  I will check a BMET given his Lasix use.  4. Coumadin therapy - this is being monitored by his primary care      physician.  5. Hyperlipidemia - he will continue on his Zocor.  He will need      lipids and liver checked in the near future.  6. Prior cerebrovascular accident.  7. Question recent pulmonary inflammatory process - he will follow up      with Pulmonary for this issue.   We will see him back in 6 months.     Madolyn Frieze Jens Som, MD, [facility]  Electronically Signed    BSC/MedQ  DD: 10/23/2008  DT: 10/24/2008  Job #: [id number]

## 2010-11-18 NOTE — H&P (Signed)
NAME:  CAMEO, SHEWELL NO.:  [id number]   MEDICAL RECORD NO.:  [id number]          PATIENT TYPE:  OBV   LOCATION:  3733                         FACILITY:  [facility]   PHYSICIAN:  Noralyn Pick. Eden Emms, MD, FACCDATE OF BIRTH:  29-Nov-1940   DATE OF ADMISSION:  04/13/2007  DATE OF DISCHARGE:                              HISTORY & PHYSICAL   PRIMARY CARDIOLOGIST:  Dr. Olga Millers.   PRIMARY CARE:  The patient is followed by [facility].   HISTORY OF PRESENT ILLNESS:  Mr. Mesa is a 70 year old Caucasian  gentleman who presents to [facility] Emergency Room via EMS tonight with  complaints of chest discomfort.  His speech is somewhat difficult to  understand secondary to a history of CVA.  Family also has contributed  to the history.  Apparently, Mr. Mose was asleep in his recliner this  afternoon.  His wife states he was asleep.  He woke up and walked to the  bathroom.  On walking back from the bathroom, she states he clutched his  chest pain and cried out.  He describes it as a sharp stabbing pain; it  lasted around 5 minutes.  He became mildly diaphoretic, no other  associated symptoms.  He had states the pain was under his left breast,  localize; it is not similar to any anginal pain he had had in the past.  Wife was concerned, however; she called EMS.  Per EMS documentation, EKG  had sinus rhythm with PVCs.  The patient had oxygen applied.  No  nitroglycerin was given per family's report.  The patient was  transported to [facility] for further evaluation.  Mr. Sales has no known  history of coronary artery disease.  He underwent a stress Myoview in  February that showed no ischemia with an EF of 48%.  Stress test was  done at that time secondary to previous diagnosis of paroxysmal atrial  fibrillation.  The patient suffered a cerebrovascular activity in  December 2007 and was found to be in atrial fib at that time.  He  underwent a TEE that showed global  reduction in the EF at 30% to 40%,  echogenic areas of apex of posterior lateral wall could not exclude  thrombus.  The patient was anticoagulated and discharged home on  Coumadin.  At that time, he suffered a significant residual effect from  the CVA to his right side and underwent some rehabilitation under the  care of Dr. Riley Kill.  Mr. Burkhead states he has done well since that time.  He saw Dr. Jens Som for a routine office visit in April.  He continued  medical therapy with recommendations for lifelong Coumadin, given his  history of CVA in the setting of atrial fibrillation.  He also continued  his Toprol for the atrial fibrillation.   ALLERGIES:  NO KNOWN DRUG ALLERGIES.   MEDICATIONS:  1. Coumadin.  2. Toprol XL 50.  3. Lisinopril 5.  4. Zocor 20.   PAST MEDICAL HISTORY:  1. Hypertension.  2. CVA.  3. Anticoagulation therapy.  4. Atrial fibrillation.  5. Hiatal hernia.  6. Status post appendectomy.  7. Dyslipidemia.  8. Questionable sleep apnea.   SOCIAL HISTORY:  He lives in [place] with his wife; this apparently is his  sixth wife.  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  He smoked a  pipe up until December of last year.  He ambulates with the assistance  of a walker or cane, denies any drug or herbal medication use.   FAMILY HISTORY:  Mother alive at age 42.  Father deceased in his 52s  secondary to CVA.   REVIEW OF SYSTEMS:  Positive for chest pains as described above and  occasional shortness of breath and generalized weakness and right-sided  paralysis without change.   PHYSICAL EXAM:  VITAL SIGNS:  Temperature 96.8, pulse 84, respirations  16, blood pressure 137/77.  The patient is saturating 98% on 2 L.  GENERAL:  He is in no acute distress, denying any chest pain at this  time.  HEENT:  Normocephalic, atraumatic.  Mucous membranes are moist.  NECK:  Supple without lymphadenopathy.  No bruits.  LUNGS:  Clear to auscultation.  CARDIOVASCULAR:  Exam  reveals S1 and S2 with frequent premature beats.  ABDOMEN:  Soft, nontender.  Positive bowel sounds.  EXTREMITIES:  The right upper extremity and right lower extremity have  generalized weakness.  Lower extremities without clubbing, cyanosis or  edema.  NEUROLOGICAL:  The patient is alert and oriented x3.   ACCESSORY CLINICAL DATA:  Chest x-ray showing no acute findings.   EKG:  Sinus rhythm with PACs and PVCs at a rate of 74.   LABORATORY WORK:  H&H 13.6 and 39.9, WBC 6.7, platelets 273,000.  Sodium  137, potassium 4, chloride 105, CO2 26, BUN 16, creatinine 0.9, glucose  102, AST 15, ALT 26.  D-dimer 0.25.  Troponin less than 0.05.  PT is  pending.   ASSESSMENT AND PLAN:  Dr. Maurine Cane in to examine and assess patient  with an episode of atypical chest discomfort, EKG showing no acute ST  changes, frequent premature ventricular contractions and premature  atrial contractions.  The patient will be admitted for 24-hour  observation, rule out paroxysmal atrial fibrillation, check a PT/INR,  discharge home in the morning if no further problems, also cycle cardiac  enzymes.      Dorian Pod, ACNP      Noralyn Pick. Eden Emms, MD, [facility]  Electronically Signed    MB/MEDQ  D:  04/13/2007  T:  04/14/2007  Job:  [id number]

## 2010-11-18 NOTE — Procedures (Signed)
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  This occurred in  2007.  He had addition to his stroke in August 2008.   Today, the patient had a right focal motor seizures that lasted for a  couple of minutes and was brought into the hospital.   Study is being done to look for the etiology of his single seizure  (780.39).   PROCEDURE:  The tracing is carried out on a 32-channel digital Cadwell  recorder reformatted into 16 channel montages with one devoted to EKG.  The patient was awake during the recording.  The International 10/20  system lead placement was used.  Medications include Coumadin,  lisinopril, Zocor, Toprol, aspirin, Klonopin, Lasix, Plavix, and Keppra.   DESCRIPTION OF FINDINGS:  Dominant frequency is a 8-9 Hz, 20-25  microvolt activity that is well regulated and attenuates partially with  eye opening.   Background activity over the right hemisphere was predominantly alpha  and beta range activity.  Over the left hemisphere, there was very low  voltage beta with acute associated dysmorphic or polymorphic lower  theta, upper delta range activity.   There was focal slowing over the left hemisphere that was subtle.  On  occasion, there were slowing seen over the right, but not nearly as  often.  There was no interictal epileptiform activity form of spikes or  sharp waves.  EKG showed atrial fibrillation with ventricular response  of 54-60 beats per minute.  There was an aberrant conduction.   There was no interictal epileptiform activity in the form of spikes or  sharp waves.   IMPRESSION:  Abnormal EEG on the basis of mild slowing over the left  hemisphere that is consistent with the patient's history of left brain  stroke.  Also consistent with postictal changes after a seizure.  No  definite  seizure activity was seen.     Deanna Artis. Sharene Skeans, M.D.  Electronically Signed    ZOX:WRUE  D:  01/01/2009 14:48:58  T:  01/02/2009 05:01:39  Job #:  [id number]

## 2010-11-18 NOTE — Assessment & Plan Note (Signed)
[facility] OFFICE NOTE   NAME:Erpelding, ANTONY                         MRN:          [id number]  DATE:12/23/2007                            DOB:          Apr 08, 1941    Mr. Sproles is a very pleasant gentleman who has a history of atrial  fibrillation complicated by previous CVA.  He did have an echocardiogram  on November 02, 2006, that showed an ejection fraction of 40-45%.  There  was mild aortic insufficiency.  He also had a Myoview on August 26, 2006, that showed an ejection fraction of 48%.  There was inferior and  apical thinning, but no sign of scar or ischemia.  Since I last saw him,  he continues to improve with his strength in his right upper and right  lower extremity, although still weak.  He also has limited vision in his  right eye.  However, he denies any dyspnea, chest pain, palpitations, or  syncope.  There is no pedal edema.  There is no bleeding.   His medications at present include Coumadin as directed and followed by  Dr. Lacie Scotts, lisinopril 5 mg p.o. daily, Zocor 20 mg p.o. daily, Toprol  25 mg p.o. daily, and aspirin 81 mg p.o. daily.   PHYSICAL EXAMINATION:  VITAL SIGNS:  Blood pressure 126/76 and his pulse  is 56.  HEENT:  Normal.  NECK:  Supple.  CHEST:  Clear.  CARDIOVASCULAR:  Regular rate and rhythm.  ABDOMEN:  No tenderness.  EXTREMITIES:  No edema.   Electrocardiogram shows a sinus rhythm at a rate of 56.  There are no  significant ST changes.   DIAGNOSES:  1. History of atrial fibrillation, now holding sinus rhythm - the      patient will continue on Toprol in case his atrial fibrillation      recurs.  He also had an embolic cerebrovascular accident with his      previous atrial fibrillation, therefore he will require Coumadin      for life.  This is being monitored by Dr. Lacie Scotts.  2. History increased troponin I with negative Myoview in February      2008.  3. Prior  cerebrovascular accident in the setting of atrial      fibrillation.  4. Coumadin therapy - a goal INR will be 2-3 and this is being managed      by Dr. Lacie Scotts.  5. Hypertension - his blood pressure is adequately controlled.  6. History of mildly reduced left ventricular function - he will      continue on his ACE inhibitor and beta-blocker.   We will see him back in [place] in 12 months.  We will also have his  most recent laboratories forwarded to [place] for our records from Dr.  Lacie Scotts.     Madolyn Frieze Jens Som, MD, [facility]  Electronically Signed    BSC/MedQ  DD: 12/23/2007  DT: 12/23/2007  Job #: [id number]   cc:   Evelene Croon

## 2010-11-18 NOTE — Assessment & Plan Note (Signed)
HISTORY OF PRESENT ILLNESS:  Darrell Hayes is back regarding a left CVA.  He  continues to make nice progress.  He is walking often without a cane at  home.  He uses the cane for longer distances.  He does complain of some  mild low back pain.  He has some interest in driving again.  Speech has  generally been improving. He is happy with his progress.  He tries to  stay active, moving around and with activities in the house.   REVIEW OF SYSTEMS:  The wife notes some confusion with memory deficits  at times.  Other pertinent positives are above and full review in  written health and history section.   SOCIAL HISTORY:  The patient is married.  Living with his wife who is  extremely supportive.   PHYSICAL EXAMINATION:  VITAL SIGNS:  Blood pressure 147/86.  Pulse 60.  Respirations  18.  SAT 98 percent on room air.  GENERAL:  The patient is pleasant, alert and oriented times 3.  NEUROLOGICAL:  Strength is generally 4/5 in the right upper extremity  with apraxia as well as 3 to 4+/5 in the right lower extremity with  similar apraxia.  The patient is wearing O2. He continues to have a  light homonomous hemianopsia.  He has some word finding deficits but it  is much improved with his conversational language.  Naming is a problem.  Cognition is difficult to truly assess, although, he seems to have good  insight and awareness outside the language problems.  HEART:  Regular  RESPIRATORY:  Chest is clear.  ABDOMINAL: Soft and nontender.   ASSESSMENT:  1. 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  2. Atrial fibrillation.  Right rotator cuff syndrome.   PLAN:  1. Advised walking any distances over 20 to 30 feet using his cane as      I think some of the form he is using with his gait is affecting his      low back.  2. I do not think he is appropriate to drive.  If  he wants to take the      driving test in a year or so he may try although I do not think he      will pass from motoric or visual field standpoint as well as      language standpoint.  3. Continue home exercise and gait.  Encouraged use of the right upper      extremity and repetitive movements and activities.  4. I will see him back as needed in the future.      Ranelle Oyster, M.D.  Electronically Signed     ZTS/MedQ  D:  08/24/2007 11:44:10  T:  08/25/2007 06:42:52  Job #:  [id number]   cc:   Evelene Croon  Fax: [phone]

## 2010-11-18 NOTE — Assessment & Plan Note (Signed)
FOLLOW UP OFFICE NOTE:  Darrell Hayes is back regarding his left-sided parietal  stroke.  He had an MRI a few weeks back due to some tingling in his  right hand, and there was some thought of a TIA, although work-up, per  his wife, was negative.  Darrell Hayes has been involved in outpatient PT and  OT, as well as speech.  Therapy completes at the end of the month,  although his speech will continue on a q week basis after that.  He  denies pain.  His right shoulder has done much better since we injected  it.  He is walking with a Hemi Walker or a straight cane at home.  PT is  working on moving him towards the cane along.  He wears boots for ankle  support.  Speech has been stable.  Darrell Hayes has interest in driving again.   REVIEW OF SYSTEMS:  Notable for the above.  Full review is in the  written health history section of the chart.   SOCIAL HISTORY:  The patient is married and without significant change.  Wife is here and supportive.   PHYSICAL EXAMINATION:  VITAL SIGNS:  Blood pressure is 116/66, pulse is  52, respiratory rate 18.  He is satting 100% on room air.  GENERAL:  The patient is pleasant, alert and oriented x3.  The patient  walks with a shuffling gait without his Hemi Walker.  When he uses the  [facility], his gait is much improved, though he still walks with  abduction and some circumduction of the left to move it though the swing  phase.  Strength is improved in the leg to 3-4/5.  He is 4/5 in the  right upper extremity with decreased fine motor coordination __________  ataxia.  Sensation is 1/2.  Rotator cuff flexibility was much improved  with no significant impingement signs noted today.  HEART:  regular.  CHEST:  Clear.  ABDOMEN:  Soft, nontender.  NEUROLOGIC:  The patient continues to have word finding deficits, but is  much better with spontaneous speech and phrasing.  He has a more  difficult time when he has to plan certain words or with thoughts.  He  does have a field cut to  the far right, particularly in the right lower  quadrant.   ASSESSMENT:  1. Left temporoparietal infarction.  2. Atrial fibrillation.  3. Right rotator cuff syndrome.   PLAN:  1. The patient is not appropriate to drive at this point.  I am not      sure that he will ever be, but he is showing some improvement, so      will follow this issue.  2. Continue Coumadin.  3. Would continue with PT and OT per therapy recommendations.  He can      transition to a home exercise program.  4. Continue with speech therapies as recommended.  Encouraged wife to      work on word finding activities, etc., to      improve in this area.  He has made nice gains with his speech over      the last few months.  5. I will see him back in 6 months time.      Ranelle Oyster, M.D.  Electronically Signed     ZTS/MedQ  D:  03/08/2007 11:33:19  T:  03/08/2007 12:28:41  Job #:  [id number]   cc:   Dr. Arbutus Ped(?)  [facility]

## 2010-11-18 NOTE — Assessment & Plan Note (Signed)
[facility]                            CARDIOLOGY OFFICE NOTE   NAME:Darrell Hayes, Darrell Hayes                         MRN:          [id number]  DATE:10/23/2008                            DOB:          10/05/1940    ADDENDUM   The patient also has a bruit in the right groin at his prior  catheterization site.  We will check an ultrasound.     Madolyn Frieze Jens Som, MD, [facility]  Electronically Signed    BSC/MedQ  DD: 10/23/2008  DT: 10/24/2008  Job #: [id number]

## 2010-11-18 NOTE — Assessment & Plan Note (Signed)
Darrell Hayes is back regarding his left stroke. He is continuing with  outpatient therapy. He is now walking with a Hemi walker. He is using a  right high top boot for ankle support and this is working fairly well  for him. He is using baclofen periodically for spasms but says these are  much improved. He does complain of some right shoulder pain.   REVIEW OF SYSTEMS:  Notable for numbness, loss of taste, problems with  language and vision still. Full review is in the written health and  history section.   SOCIAL HISTORY:  Patient lives with his wife who is very supportive.   PHYSICAL EXAMINATION:  Blood pressure is 142/85, pulse is 68,  respiratory rate is 16, he is sating 98% in room air. Patient is  pleasant, alert and oriented x3. Affect is bright and appropriate. Gait  is slightly wide based, and he circumed wide based. He circumducts the  right foot with swing phase. He also abducts the leg to a certain extent  as well. His sensory exam remains decreased still to pinprick and light  touch. Strength is improved dramatically as he is 5 out of 5 in the  right leg and 4 to 4+ out of 5 in the right arm and hand. He is weakest  at the right hand with intrinsic movements. 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  HEART: Regular.  CHEST: Clear.  ABDOMEN: Soft, nontender.  The patient continues to have word finding deficits. He does a lot of  paraphrasing. He is able to follow all simple commands however for me  today. He has a field cut to the right.   ASSESSMENT:  1. Left temporal parietal infarct.  2. Atrial fibrillation.   PLAN:  1. Continue physical therapy.  2. Continue Coumadin.  3. After informed consent we injected the right shoulder via the      posterior approach using 40 mg of Kenalog and 3 cc of 1% lidocaine.      The patient tolerated well.  4. I will see him back in 3 months time. He continues to do nicely      overall. 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      Ranelle Oyster, M.D.  Electronically Signed     ZTS/MedQ  D:  12/06/2006 12:06:29  T:  12/06/2006 13:40:13  Job #:  [id number]

## 2010-11-18 NOTE — Discharge Summary (Signed)
NAME:  Darrell Hayes, DEFINO NO.:  [id number]   MEDICAL RECORD NO.:  [id number]          PATIENT TYPE:  INP   LOCATION:  4740                         FACILITY:  [facility]   PHYSICIAN:  Bevelyn Buckles. Bensimhon, MDDATE OF BIRTH:  [birth year]   DATE OF ADMISSION:  09/24/2008  DATE OF DISCHARGE:  09/29/2008                               DISCHARGE SUMMARY   THE PATIENT'S PRIMARY CARDIOLOGIST:  Madolyn Frieze. Jens Som, MD, [facility]   PRIMARY CARE PHYSICIAN:  [facility].   CARDIOLOGY INTERVENTIONALIST:  Verne Carrow, MD   DISCHARGE DIAGNOSIS:  Coronary artery disease - status post bare-mental  stent to mid-left anterior descending.   SECONDARY DIAGNOSES:  1. Paroxysmal atrial fibrillation with rapid ventricular response.  2. 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  3. Hypertension.  4. Hyperlipidemia.  5. Hiatal hernia.   ALLERGIES:  NKDA.   PROCEDURE PERFORMED DURING THIS HOSPITALIZATION:  1. On September 24, 2008, the patient had EKG that showed sinus rhythm      with ventricular bigeminy and a rate of 67 bpm, borderline low      voltage in frontal leads, no acute ST-T wave changes, and no      significant Q-waves.  PR 188, QRS 82, and QTc 376.  2. On the same date, September 24, 2008, the patient had chest x-ray that      showed bibasilar atelectasis.  3. The patient had EKG on September 25, 2008, that showed sinus      bradycardia with a rate of 56 beats per minute, otherwise no      significant changes from prior EKG.  EKG on September 26, 2008, no      significant changes from prior EKGs.  EKG on September 27, 2008, normal      sinus rhythm with a rate of 64 beats per minute, otherwise no      significant changes from prior EKGs.  4. The patient had cardiac catheterization on September 26, 2008, that      showed  4A.  Single-vessel CAD.  4B.  Normal left ventricular systolic function.  4C.  Successful percutaneous  coronary artery intervention with placement  of bare-mental stent in the mid left anterior descending.  4D.  Placement of an Angio-Seal femoral artery closure device.   HISTORY OF PRESENT ILLNESS:  [name] is a 70 year old Caucasian male  with a known history of atrial fibrillation, hypertension, left MCA  infarct with hemorrhagic transformation in 2007, hyperlipidemia, who  awoke his wife around 3:20 a.m. on the morning of admission, secondary  to chest pain, which he described as heaviness, radiating to left  shoulder, and down the left arm, with numbness and tingling in the left  hand.  The patient states, he was unable to get enough air and was  panting.  Wife called 911, and was advised to give aspirin.  The patient  continued to experience chest discomfort in the ED and was given a  nitroglycerin patch.  Pain subsided somewhat.  He states that he felt  drunk.  The patient saw Dr. Jens Som 1 week prior to this episode in the  office.  At that time, the patient was complaining of some lower  extremity edema and increased shortness breath.  Dr. Jens Som had an  echocardiogram completed in the office along with lab work.  The  patient's echocardiogram revealed LVEF of 55-60%.  There was no  diagnostic evidence of left ventricular wall motion abnormalities.  Aortic valve was mildly calcified.  The atrium was mildly dilated.  It  was noted that the patient did have heart rates up to 147s initially  during the study, which was likely to be atrial flutter with 2:1  conduction.   The patient is followed by [facility] and has Coumadin and  PT/INR, check there each week.  The patient was due to see them tomorrow  (September 25, 2008).   At the time of evaluation in the ED, the patient was comfortable without  complaints of shortness of breath or chest pain.   HOSPITAL COURSE:  The patient admitted and underwent procedures as  described above by tolerating them well without  significant  complications.  The patient did have mild ecchymosis at the cath site,  right groin, without edema or significant pain.  The patient also  reported return of significant shortness of breath and Pulmonology was  consulted.  Per pulmonary recommendations, no further workup inpatient  was necessary and he will have a follow up appointment scheduled with  them in the next few weeks.  At the time of discharge, the patient's INR  was subtherapeutic, and therefore he will be discharged on Lovenox  bridge.  Pharmacy was consulted and per the recommendations, he will be  seen at [facility] early next week and will take 5 mg of Coumadin  p.o. daily until reevaluation at that appointment.  He will received 100  mg of Lovenox subcu injection q.12 h., also further recommendations.  Prior to being discharged from the hospital, the patient will receive  Lovenox teaching, post cath instructions, new medication list, and  follow up instructions in both oral and written form.  At that time, he  will have no questions or concerns that have not been addressed.  Note,  the patient's vital signs remained stable during his hospital course.  Most recent vital signs on day of discharge, temp 98.4 degrees  Fahrenheit, BP 112/72, pulse 64, respiration rate 16, and O2 saturation  98% on room air.  His weight was 91.7 kg.   DISCHARGE LABORATORIES:  WBC 7.3, HGB of 11.4, HCT of 33.0, and PLT  count 212.  Protime 19.2 and INR 1.5.  Sodium 139, potassium 3.6,  chloride 106, CO2 of 26, BUN 10, creatinine 1.27, glucose 117, calcium  8.6, and magnesium 2.2.  Total cholesterol 94, triglycerides 82, HDL 23,  LDL 55, and VLDL 16.  TSH 1.907.  The patient had 2 sets of point of  care cardiac enzymes and 2 full sets of cardiac enzymes were also  negative.  Fecal occult blood was negative x2.   FOLLOWUP PLANS AND APPOINTMENTS:  1. The patient has a follow up appointment at [facility] on October 02, 1998, at 10:30 a.m. (please note, once INR is therapeutic, the      patient's Coumadin will be followed at his primary care office per      usual).  2. Next appointments with Dr. Jens Som on September 22, 2008, at 12:15  p.m.   DISCHARGE MEDICATIONS:  1. Coumadin 5 mg p.o. daily.  2. Simvastatin 20 mg p.o. daily.  3. Metoprolol XL 50 mg p.o. daily.  4. Lisinopril 5 mg p.o. daily.  5. Clonazepam 0.5 mg daily.  6. Fish oil 1200 mg p.o. daily.  7. Lasix 40 mg p.o. daily.  8. Vitamin D 1000 mg p.o. daily.  9. Enteric-coated aspirin 81 mg p.o. daily.  10.Plavix 75 mg daily.  11.Lovenox subcu injections 100 mg q.12 h. (until INR greater than or      equal to 2.0).   DURATION OF DISCHARGE ENCOUNTER INCLUDING PHYSICIAN TIME:  Forty-five  minutes.      Jarrett Ables, [facility]      Daniel R. Bensimhon, MD  Electronically Signed    MS/MEDQ  D:  09/29/2008  T:  09/29/2008  Job:  [id number]   cc:   Madolyn Frieze. Jens Som, MD, [facility]  [facility]  Verne Carrow, MD

## 2010-11-18 NOTE — Discharge Summary (Signed)
NAME:  Darrell Hayes, KRAGER NO.:  [id number]   MEDICAL RECORD NO.:  [id number]          PATIENT TYPE:  OBV   LOCATION:  3733                         FACILITY:  [facility]   PHYSICIAN:  Bevelyn Buckles. Bensimhon, MDDATE OF BIRTH:  [birth year]   DATE OF ADMISSION:  04/13/2007  DATE OF DISCHARGE:  04/14/2007                               DISCHARGE SUMMARY   PRIMARY CARDIOLOGIST:  Madolyn Frieze. Jens Som, MD.   PRIMARY CARE Fendi Meinhardt:  [facility].   DISCHARGE DIAGNOSIS:  Chest pain.   SECONDARY DIAGNOSES:  1. Right hypertension, history of cerebrovascular accident and left      middle cerebral artery cerebrovascular accident December 2007.  2. Paroxysmal atrial fibrillation.  3. Chronic Coumadin anticoagulation.  4. History of hiatal hernia.  5. Questionable sleep apnea.  6. Status post appendectomy.  7. Hyperlipidemia.   ALLERGIES:  No known drug allergies.   PROCEDURES:  None.   HISTORY OF PRESENT ILLNESS:  A 70 year old Caucasian male with no prior  history of CAD.  He was in his usual state of health until April 13, 2007 when while walking back from a trip to the bathroom, he had the  sudden onset of sharp stabbing left-sided chest pain with mild  diaphoresis, lasting 5 minutes, and resolving spontaneously.  This was  not similar to his previous angina.  Because of the symptoms he  presented to the [facility] ED where ECG showed sinus rhythm with PACs  and PVCs and no acute ST-T changes.  He was placed in observation for  rule out.   HOSPITAL COURSE:  The patient ruled out for MI and has no further  symptoms.  He is being discharged home today in satisfactory condition.   DISCHARGE LABS:  Hemoglobin 12.2, hematocrit 36.5, WBC 6.6, platelets  247, INR 1.5.  Sodium 41, potassium 3.8, chloride 108, CO2 29, BUN 14,  creatinine 1.14, glucose 100, calcium 9.0.  Total bilirubin 0.7,  alkaline phosphatase 81, AST 50, ALT 26, total protein 6.8 albumin 3.5,  CK 46, MB  1.6, troponin I 0.01, total cholesterol 131, triglycerides 56,  HDL 37, LDL 83.   DISPOSITION:  Patient is being discharged home today in good condition.   FOLLOWUP PLANS AND APPOINTMENTS:  Will arrange for followup with Dr.  Jens Som in approximately 3 to 4 weeks.  He is to followup with his  primary care Brandell Maready as scheduled.  He will need to continue to  followup with [facility] as his INR is subtherapeutic.   DISCHARGE MEDICATIONS:  1. Toprol XL 50 mg daily.  2. Lisinopril 5 mg daily.  3. Zocor 40 mg nightly.  4. Coumadin as prescribed.  5. Aspirin 81 mg daily.  6. Nitroglycerin 0.4 mg sublingual p.r.n. chest pain.   OUTSTANDING LAB STUDIES:  None.   DURATION DISCHARGE ENCOUNTER:  35 minutes including physician time.      Nicolasa Ducking, ANP      Bevelyn Buckles. Bensimhon, MD  Electronically Signed    CB/MEDQ  D:  04/14/2007  T:  04/15/2007  Job:  [id number]   cc:  [facility]

## 2010-11-18 NOTE — Consult Note (Signed)
NAME:  Darrell Hayes, Darrell Hayes NO.:  [id number]   MEDICAL RECORD NO.:  [id number]          PATIENT TYPE:  EMS   LOCATION:  MAJO                         FACILITY:  [facility]   PHYSICIAN:  Renee Ramus, MD       DATE OF BIRTH:  1941/04/04   DATE OF CONSULTATION:  DATE OF DISCHARGE:                                 CONSULTATION   PRIMARY CARE PHYSICIAN:  [facility].   HISTORY OF PRESENT ILLNESS:  The patient is a 70 year old male with  acute mental status changes including increased agitation and decreased  orientation times approximately 12 hours prior to admission.  The  patient has been suffering from insomnia for the past 2-3 days prior to  admission.  He does have a previous stroke history and at baseline has  issues with orientation, but is not violent and is not agitated.  The  patient has no evidence of fevers, chills, night sweats, nausea, or  vomiting.  Called to see the patient secondary to evaluation of  potential etiologies for mental status changes.   PAST MEDICAL HISTORY:  1. Atrial fibrillation.  2. History of stroke.  3. Hypertension.  4. Hyperlipidemia.  5. History of previous hematuria, diagnosed as urinary tract infection      in March, seen by Urology, who felt it was simple UTI.  6. Chronic Coumadin therapy for atrial fibrillation in setting of      stroke.   SOCIAL HISTORY:  No tobacco.  No alcohol use.  The patient was at home  with his wife.  He is cared for by his family members.   FAMILY HISTORY:  Not available.   REVIEW OF SYSTEMS:  All other comprehensive review of systems are  negative.   CURRENT MEDICATIONS:  1. Lisinopril 5 mg p.o. daily.  2. Zocor 20 mg p.o. daily.  3. Toprol-XL 25 mg p.o. daily.  4. Aspirin 81 mg p.o. daily.  5. Coumadin 6 mg p.o. daily.   The patient has no known drug allergies.   PHYSICAL EXAMINATION:  GENERAL:  This is a well-developed, well-  nourished white male, currently in no apparent  distress.  VITAL SIGNS:  Pulse 89, respiratory rate 18, temperature 98.2, and blood  pressure 127/81.  HEENT:  Oropharynx is clear.  Mucous membranes, pink and moist.  TMs are  clear bilaterally.  Pupils equally reactive to light and accommodation.  Extraocular muscles are intact.  NECK:  No jugular venous distention or lymphadenopathy.  CARDIOVASCULAR:  Regular rate and rhythm without murmurs, rubs, or  gallops.  PULMONARY:  Lungs are clear to auscultation bilaterally.  ABDOMEN:  Soft, nontender, and nondistended without hepatosplenomegaly.  Bowels sounds are present.  He has no rebound or guarding.  EXTREMITIES:  He has no clubbing, cyanosis, or edema.  He has good  peripheral pulses, dorsalis pedis, and radial arteries.  He is able to  move all extremities.  NEUROLOGIC:  Cranial nerves II through XII are grossly intact.  He has  no focal neurological deficit.  He is somewhat oriented, but he is not  violent.  He  is calm and well behaved and easily directable.  The  patient is very anxious to go home.   LABORATORY DATA:  White count 15.2, H&H 14.7 and 44.5, MCV 93, and  platelets 281.  Sodium 140, potassium 4.0, chloride 106, bicarb 24, BUN  12, creatinine 1.2, glucose 130, and INR of 3.3.  UA shows large blood  and ketones.  Chest x-ray shows no acute disease.  CT of the head shows  no acute disease.   ASSESSMENT AND PLAN:  1. Pyelonephritis.  The patient has evidence of pyelonephritis, which      is probably making his baseline mental status problems worse.  We      will start him on ciprofloxacin x14-day course.  In the interim, we      will decrease his Coumadin to 50% of his current level, so the      patient does not become over anticoagulated.  2. Agitation.  As above.  We will start Ativan on a p.r.n. trial.  If      the patient can tolerate this, this may be a useful drug for him on      a p.r.n. basis.  3. Atrial fibrillation.  Decrease Coumadin as previous while on  Cipro.      The patient is currently well rate controlled.  4. Stroke history.  Continue aspirin, Coumadin therapy, blood pressure      therapy, and statin therapy.  5. Hypertension.  Currently stable.  6. Hyperlipidemia.  Currently stable as above.  Continue statins.  7. Disposition.  The patient should follow up with his primary care      physician within 1 week for recheck of UA and CBC.   Consult note was obtained by reviewing past medical history, confirming  with the emergency medical room physician, and reviewing the emergency  medical record.   TIME SPENT:  One hour.      Renee Ramus, MD  Electronically Signed     JF/MEDQ  D:  07/01/2008  T:  07/02/2008  Job:  [id number]   cc:   [facility]

## 2010-11-18 NOTE — Assessment & Plan Note (Signed)
[facility] OFFICE NOTE   NAME:Darrell Hayes, Darrell Hayes                         MRN:          [id number]  DATE:05/06/2007                            DOB:          23-Feb-1941    Darrell Hayes is a very pleasant gentleman, who has a history of atrial  fibrillation, complicated by CVA.  He was recently admitted to [facility] with what was felt to be atypical chest pain, per the  notes.  He did rule out for myocardial infarction with serial enzymes  and was asked to follow up with me today.  Note:  He states his chest  pain is in the left chest area.  It is described as a sharp pain.  It  can last all day, at times.  It is worse when he sits down.  It is not  pleuritic or positional.  There is no dyspnea.  Note:  He has continued  to make slow recovery from his CVA.   PRESENT MEDICATIONS INCLUDE:  1. Coumadin as directed.  This is being monitored by Dr. Lacie Scotts.  2. Lisinopril 5 mg p.o. daily.  3. Zocor 20 mg p.o. daily.  4. Toprol 25 mg p.o. daily.  5. Aspirin 81 mg p.o. daily.   PHYSICAL EXAM TODAY:  Shows a blood pressure of 120/70, and a pulse of  70.  HEENT:  Normal.  NECK:  Supple.  CHEST:  Clear.  CARDIOVASCULAR EXAM:  Reveals a regular rate and rhythm.  ABDOMINAL EXAM:  Shows no tenderness.  EXTREMITIES:  Show no edema.   Electrocardiogram shows sinus rhythm with frequent PVCs.  The axis is  normal.  There are no significant ST changes.  His most recent  echocardiogram was performed on November 02, 2006, and showed an ejection  fraction of 40-45%.  There is mild aortic insufficiency and trivial  mitral regurgitation.   DIAGNOSES:  1. History of atrial fibrillation, now holding sinus rhythm.  He will      continue on his Coumadin with a goal INR of 2-3, as he had an      embolic CVA previously.  He will also continue on Toprol 25 mg p.o.      daily.  2. History of increased troponin I with negative  Myoview in February      of 2008.  3. Prior CVA in the setting of atrial fibrillation.  4. Coumadin therapy - monitored by Dr. Lacie Scotts and our goal INR      should be 2-3.  5. Hypertension - his blood pressure is well-controlled.  6. Mild cardiomyopathy - he will continue on his ACE inhibitor and      beta blocker.   Note:  A previous TSH is normal.   I will see him back in six months.     Madolyn Frieze Jens Som, MD, [facility]  Electronically Signed    BSC/MedQ  DD: 05/06/2007  DT: 05/06/2007  Job #: [id number]   cc:   Evelene Croon

## 2010-11-18 NOTE — Cardiovascular Report (Signed)
NAME:  Darrell Hayes, Darrell Hayes NO.:  [id number]   MEDICAL RECORD NO.:  [id number]           PATIENT TYPE:   LOCATION:                                 FACILITY:   PHYSICIAN:  Verne Carrow, MDDATE OF BIRTH:  [birth year]   DATE OF PROCEDURE:  09/26/2008  DATE OF DISCHARGE:                            CARDIAC CATHETERIZATION   PRIMARY CARDIOLOGIST:  Madolyn Frieze. Jens Som, MD, [facility]   PROCEDURES PERFORMED:  1. Left heart catheterization.  2. Selective coronary angiography.  3. Left ventricular angiogram.  4. Percutaneous coronary intervention with placement of a bare-metal      stent in the mid left anterior descending.  5. Placement of an Angio-Seal femoral artery closure device.   OPERATOR:  Verne Carrow, MD   INDICATION:  Unstable angina.   DETAILS OF PROCEDURE:  The patient was brought to the inpatient cardiac  catheterization laboratory after signing informed consent.  The right  groin was prepped and draped in a sterile fashion.  A 5-French sheath  was inserted into the right femoral artery without difficulty.  A 7-  French venous sheath was placed in the right femoral vein without  difficulty.  Standard diagnostic catheters were used to perform  selective coronary angiography.  A 5-French pigtail catheter was used  across the aortic valve into the left ventricle.  Following the  performance of the left ventricular angiogram, the catheter was pulled  back across the aortic valve with no significant pressure gradient  measured.  A right heart catheterization was performed with a Swan-Ganz  catheter.   At this point in the procedure, we elected to proceed to a percutaneous  coronary intervention of the mid left anterior descending coronary  artery.  An XB LAD 3.5 guiding catheter was inserted into the left main  coronary artery.  A Cougar intracoronary wire was passed down the length  of the LAD without difficulty.  A 2.5 x 20 mm balloon was used for  predilatation of the lesion.  A 2.75 x 24 mm Driver bare-metal stent was  deployed in the mid LAD across a large diagonal branch.  A 2.75 x 21 mm  noncompliant balloon was inflated to 15 atmospheres inside the stented  segment.  There was an excellent angiographic result.  There was TIMI-3  flow down the left anterior descending and the large diagonal branch  following the intervention.  There was a 30-40% ostial stenosis in the  ostium of the diagonal branch following the intervention.  The patient  tolerated the procedure well.  An Angio-Seal femoral artery closure  device was placed in the right femoral artery at the end of the case.  The patient was taken to the holding area in stable condition.   ANGIOGRAPHIC FINDINGS:  1. The left main coronary artery has no evidence of disease.  2. The left anterior descending is a large vessel that bifurcates into      a large diagonal branch and a moderate-sized distal LAD.  This has      the appearance of a dual-LAD system.  There is a 90% stenosis in  the LAD proper, just distal to the bifurcation of the large      diagonal branch.  There is another 80% stenosis 12-14 mm downstream      from the bifurcation in what appears to be the distal LAD.  The      first diagonal is moderate-sized vessel with plaque disease in the      ostium.  The second diagonal is a large-caliber vessel with no      obvious disease.  3. Circumflex artery gives rise to a very large ramus intermedius      branch.  There is a small first obtuse marginal branch.  There is      no evidence of disease in this system.  4. The right coronary artery is a large dominant vessel that gives off      a large posterior descending artery and a large posterolateral      segment.  There are serial 30% lesions throughout the mid and      distal right coronary artery.  The posterolateral branch is a large      bifurcating segment, has plaque disease only.  There is no       significant disease in the PDA.  5. Left ventricular angiogram was performed in the RAO projection,      shows normal left ventricular systolic function with no wall motion      abnormalities.  Ejection fraction is estimated at 55%.   HEMODYNAMIC DATA:  Central aortic pressure 102/63.  Left ventricular  pressure 103/7.  End-diastolic pressure 12.  Right atrial pressure 7/5.  Right ventricular pressure 27/4.  Right ventricular end-diastolic  pressure 8.  Pulmonary artery pressure 24/8 with a mean of 16.  Pulmonary capillary wedge pressure 10.  Central aortic saturation 98%.  Pulmonary artery saturation 66%.  Cardiac output by Fick method 5.32.  Cardiac index 2.57.   IMPRESSION:  1. Single-vessel coronary artery disease.  2. Normal left ventricular systolic function.  3. Successful percutaneous coronary artery intervention with placement      of a bare-metal stent in the mid left anterior descending.  4. Placement of an Angio-Seal femoral artery closure device.   RECOMMENDATIONS:  This patient should be continued on aspirin and Plavix  for at least 1 month.  He is on chronic Coumadin for a stroke and  because of this, we will resume his heparin drip here in the hospital 6  hours after there is adequate hemostasis from sheath removal.  He will  be discharged likely with a Lovenox bridge; however, I will leave this  up to the physician responsible for his care.  Of note, we elected to  put a bare-metal stent in his mid LAD because of his need for chronic  Coumadin therapy because of the prior stroke.  I discussed the case with  Dr. Olga Millers, who is his primary cardiologist, and felt that it  would be best to place a bare-metal stent since the patient is on long-  term Coumadin therapy.      Verne Carrow, MD  Electronically Signed     CM/MEDQ  D:  09/26/2008  T:  09/27/2008  Job:  [id number]

## 2010-11-18 NOTE — H&P (Signed)
NAME:  Darrell Hayes, Darrell Hayes NO.:  [id number]   MEDICAL RECORD NO.:  [id number]          PATIENT TYPE:  INP   LOCATION:  3735                         FACILITY:  [facility]   PHYSICIAN:  Pricilla Riffle, MD, FACCDATE OF BIRTH:  [birth year]   DATE OF ADMISSION:  09/24/2008  DATE OF DISCHARGE:                              HISTORY & PHYSICAL   PRIMARY CARDIOLOGIST:  Madolyn Frieze. Jens Som, MD, [facility]   PRIMARY CARE PHYSICIAN:  [facility] of [facility].   HISTORY OF PRESENT ILLNESS:  A 70 year old Caucasian male with known  history of atrial fibrillation, hypertension, a left MCA infarct with  hemorrhagic transformation in 2007, hyperlipidemia who woke his wife  around 3:20 a.m. this morning secondary to chest pain which she  described as heaviness radiating to the left shoulder down the left arm  with numbness and tingling in the left hand.  The patient states he was  unable to get enough air and was panting.  His wife called 911 and was  advised to give aspirin.  The patient continued chest discomfort in the  emergency room and was given a nitroglycerin patch.  Pain has subsided  somewhat.  He states that he feels drunk.   The patient saw Dr. Jens Som 1 week ago in the office.  At that time,  the patient was complaining of some lower extremity edema and  to have  increased shortness of breath.  At that time, Dr. Jens Som had an  echocardiogram completed in the office along with lab work.  The  patient's echocardiogram revealed an LVEF of 55-60%.  There was no  diagnostic evidence of left ventricular wall motion abnormalities.  The  aortic valve was mildly calcified.  The left atrium was mildly dilated.  It was noted that the patient did have heart rates up to 140s initially  during the study which was likely to found to be atrial flutter with 2:1  conduction.   The patient is also followed by [facility] and has  Coumadin PT/INR checked there each week.   The patient was due to see  them tomorrow.  As a result of all these patient's symptoms, we were  asked to evaluate by ER physician.   Currently, the patient is comfortable without complaints of shortness of  breath or chest pain.  Family is at bedside and very attentive.   REVIEW OF SYSTEMS:  Positive for sudden onset of chest pain, shortness  of breath, PND, lower extremity edema over the last week, the pain was  radiating to the left arm with numbness to the left arm and hand with  associated nausea.  All other systems were reviewed and found to be  negative.   PAST MEDICAL HISTORY:  1. Atrial fibrillation with RVR.  2. 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  3. Hypertension.  4. Hyperlipidemia.  5. Hiatal hernia.   PAST CARDIAC WORKUP:  The patient had a stress Myoview in February 2008  revealing apical stenting with no significant scar or ischemia.  Echocardiogram completed on September 10, 2008 revealed LVEF of 60%.  PAST SURGICAL HISTORY:  Appendectomy.   SOCIAL HISTORY:  The patient lives in [place] with his wife.  He is  retired.  He used to smoke a pipe and rare EtOH use.   FAMILY HISTORY:  CVA.   CURRENT MEDICATIONS AT HOME:  1. Coumadin 5 mg daily.  2. Lisinopril 5 mg daily.  3. Zocor 20 mg at bedtime.  4. Toprol-XL 25 mg daily.  5. Aspirin 81 mg daily.  6. Fish oil daily.  7. Calcium supplement daily.  8. Lasix 40 mg daily.  9. Klonopin 0.5 mg 1/2 tablet daily.   ALLERGIES:  No known drug allergies.   CURRENT LABORATORY DATA:  Sodium 138, potassium 3.8, chloride 102, CO2  25, BUN 18, creatinine 1.4, and glucose 141.  Hemoglobin 13.8,  hematocrit 39.8, white blood cells 9.2, and platelets 223.  PT 36.5 and  INR 3.3.  Chest x-ray revealing bibasilar atelectasis.  CT scan dated  September 10, 2008 revealed negative for PE, positive for cardiomegaly with  multivessel CAD.   PHYSICAL EXAMINATION:  VITAL SIGNS:  Blood pressure 107/56, pulse 53,   respirations 20, temperature 97.8, and O2 sat 98% on room air.  HEENT:  Head is normocephalic and atraumatic.  Eyes:  PERRLA.  Mucous  membranes of mouth are pink and moist.  Tongue is midline.  NECK:  Supple.  There is no JVD.  No carotid bruits appreciated.  CARDIOVASCULAR:  Distant heart sounds, irregular, bradycardic without  rubs or gallops.  Pulses are equal bilaterally.  LUNGS:  Clear to auscultation without wheezes, rales, or rhonchi mildly  diminished in the right lower lobe.  ABDOMEN:  Soft, nontender, obese, 2+ bowel sounds with some mild right  upper quadrant tenderness with no hepatomegaly.  EXTREMITIES:  With 1+ edema bilaterally, nonpitting.  NEUROLOGIC:  The patient does have expressive aphasia.   IMPRESSION:  1. Chest pain with radiation to the left arm, symptoms worrisome for      unstable angina.  2. Sudden onset of dyspnea with paroxysmal nocturnal dyspnea.  3. Chronic renal insufficiency with a baseline INR of 1.4.  4. History of left middle cerebral artery infarct.  5. Atrial fibrillation, on chronic Coumadin.  6. Supratherapeutic INR.   PLAN:  This is a 70 year old male with known history of AFib,  hypertension, and left CVA who awoke suddenly around 3:30 a.m. this  morning with substernal chest pain radiating to the left arm and  numbness and tingling in the left hand with associated shortness of  breath.  Symptoms are worrisome for unstable angina.  The patient's  cardiac enzymes were found to be negative x1.  His INR was found to be  supratherapeutic   The patient was seen and examined by Dr. Dietrich Pates.  It was discussed  with the patient and the family that the patient would benefit from a  left and right heart catheterization to define anatomy and pressures  especially with known abnormal CT revealing multivessel CAD.  The  patient understands and agrees to proceed.  With elevated INR, Dr. Tenny Craw  spoke with Pharmacy and was advised on dosing of vitamin  K.  He will  receive 1 mg p.o. x1 and will check INR at 8:00 p.m. tonight and follow  up in the a.m.  We will continue all other meds with the exception of  the Lasix which we will hold at present for there is no evidence of CHF.  We will also check lipids in the a.m. along with  BMET to evaluate renal  function.  This has been discussed with the patient's family who  verbalized understanding and agreed for admission.      Bettey Mare. Lyman Bishop, NP      Pricilla Riffle, MD, [facility]  Electronically Signed    KML/MEDQ  D:  09/24/2008  T:  09/24/2008  Job:  [id number]   cc:   [facility]

## 2010-11-18 NOTE — Consult Note (Signed)
NAME:  Darrell Hayes, MEALS NO.:  [id number]   MEDICAL RECORD NO.:  [id number]          PATIENT TYPE:  EMS   LOCATION:  MAJO                         FACILITY:  [facility]   PHYSICIAN:  Deanna Artis. Hickling, M.D.DATE OF BIRTH:  02-07-1941   DATE OF CONSULTATION:  01/01/2009  DATE OF DISCHARGE:  01/01/2009                                 CONSULTATION   CHIEF COMPLAINT:  Code stroke.   The patient is a 70 year old well known to me from a hospitalization in  late December 2007.  The patient developed onset of difficulties between  9 and 10 a.m., but did not present to [facility] until 7:37 p.m.   CT scan of the brain showed subacute infarction involving left temporal  parietal region.  I performed an MRI scan of the brain which showed an  acute infarction involving the left middle cerebral artery in the  central and angular arteries.   Later in his hospital stay, he had a shower of small emboli that could  be seen on CT scan.   The patient had significant atrial fibrillation and has been followed by  [facility] for this.  He has also had problems with hypertension  and dyslipidemia.  At the time that I had seen him, however, the  patient's only chronic illness was hiatal hernia.  His past surgical  history is remarkable for appendectomy.  He has had a stent placed in  his chest about 3 weeks ago that was a double stent for atherosclerotic  cardiovascular disease.   He was seen in the emergency room in August 2008 complaining of numbness  and tingling.  I happened to be on-call at that time.  An MRI scan was  performed and it showed slight extension of his stroke in the occipital  region, because his prothrombin time was normal and there was nothing  else to do, we allowed him to go home.   The patient was admitted to the hospital in October 2008 with chest pain  and was noted to have right-sided hypertension, paroxysmal atrial  fibrillation, possible sleep apnea,  and dyslipidemia.   He had another emergency room visit on July 01, 2008, where he had  increased agitation and decreased orientation of 12 hours in duration.  He was seen by Dr. Renee Ramus, hospitalist and was diagnosed with  pyelonephritis.   His most recent hospitalization was on September 24, 2008, through September 29, 2008, where he had a bare mental stent placed in his left anterior  descending artery.   His current medications include Coumadin, lisinopril 5 mg daily, Zocor  40 mg daily, Toprol 50 mg daily, aspirin 81 mg daily, Klonopin dose  unknown, vitamin D3, Omega-3 fatty acids, Lasix 40 mg daily, and  Senokot.   He has no known allergies to medications.   FAMILY HISTORY:  Positive for stroke in his father and his father was in  his 88s and died.  Mother was in her 10s and had osteoporosis and  multiple fractures.   SOCIAL HISTORY:  The patient is married to his sixth wife.  He was  divorced from his first wife and the second through fourth had died.  He  has been married to his current wife for about 17 years.  There are 2  children from his former marriage.  He is a pipe smoker.  He rarely  drinks alcohol.  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   The patient has given up his pipe.  He does not drink alcohol.  He does  not use drugs.  His only surgical procedure other than stent placements  was appendectomy.   PHYSICAL EXAMINATION:  GENERAL:  Today, this is a pleasant gentleman in  no acute distress.  VITAL SIGNS:  Temperature 98.0, blood pressure 143/80, resting pulse 56,  respirations 18, and oxygen saturation 94%.  HEAD, EYES, EARS, NOSE, AND THROAT:  I cannot see his tympanic membranes  because of wax.  He has no signs of infection.  NECK:  He has a supple neck.  No cranial or cervical bruits.  LUNGS:  Clear.  HEART:  No  murmurs.  He has an irregularly irregular rhythm.  ABDOMEN:  Soft, slightly tender in the left lower quadrant.  I do not  cannot any palpated masses and he has normal bowel sounds.  EXTREMITIES:  Right-sided spasticity and increased tone.  His deep  tendon reflexes show right reflex predominance.  He has right extensor  plantar response.  He has about 4/5 strength in most of his muscles on  the right side and 5/5 strength on the left.  He has good fine motor  movements on the left and no pronator drift.   Sensation shows hypesthesia on the right side that is most marked in his  leg.  He extinguishes to double simultaneous stimuli.  He has a dense  right homonymous hemianopsia.  He has a mild right central facial.  He  has a mixed aphasia.  He understands much of what is said to him and can  follow commands.  He is able to speak in a social way.  He has some  difficulty with more complicated language.   His NIH stroke scale today is 10.   HISTORY:  The patient was at home and was at his baseline.  He suddenly  told his wife that he could not see.  Shortly thereafter he indicated  that the tingling on his body.  He then commenced to have a focal motor  seizure involving arm and leg more so than face that lasted for a few  minutes.  A call was made to Dr. Marliss Coots office and recommendations  were made to transfer him to [facility].  He was seen by triage  nurse and a code stroke was called on the basis of what I do not know.   We assessed him and found that his symptoms were at baseline, took his  history and noted that he have a focal motor seizure.   I have reviewed his CT scan which shows a remote stroke evolving the  left middle cerebral artery, central and angular arteries.   There are no new findings.   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  (438.89, 345.50)   PLAN:  The  patient will have an EEG performed today.  We will likely  place him  on carbamazepine at a dose of 200 mg twice daily and gradually  increase this to 600-800 mg a day over the next couple of weeks.  I  appreciate the opportunity to participate in his care.  He will need to  have SGPT, CBC with differential, and trough carbamazepine levels drawn  in 2 weeks' time.  We will follow him in my office in about 2 months'  time.  His wife was given card to follow up.  We will be happy to see  him sooner should the need arise.  There certainly is possibility that  he could  have seizures between now and when carbamazepine becomes therapeutic.  This is a so-called anniversary seizure that occurs in the aftermath of  stroke at some future time.   Unless he has more seizures, he will be discharged from [facility] and  followed as an outpatient.      Deanna Artis. Sharene Skeans, M.D.  Electronically Signed     WHH/MEDQ  D:  01/01/2009  T:  01/02/2009  Job:  [id number]   cc:   Galen Daft. Oscar La, MD  Madolyn Frieze. Jens Som, MD, [facility]

## 2010-11-21 NOTE — H&P (Signed)
NAME:  Darrell Hayes, Darrell Hayes NO.:  [id number]   MEDICAL RECORD NO.:  [id number]          PATIENT TYPE:  IPS   LOCATION:  4036                         FACILITY:  [facility]   PHYSICIAN:  Ranelle Oyster, M.D.DATE OF BIRTH:  Mar 03, 1941   DATE OF ADMISSION:  07/12/2006  DATE OF DISCHARGE:                              HISTORY & PHYSICAL   CHIEF COMPLAINT:  Right-sided weakness and aphasia.   HISTORY OF PRESENT ILLNESS:  This is a 70 year old white male with a  history of tobacco use admitted on December 29 with slurred speech.  Workup revealed a left MCA infarct with left middle cerebral artery  branch occlusion with hemorrhagic transformation.  Echocardiogram  revealed ejection fraction of 30-40%.  During the hospitalization, the  patient experienced bouts of atrial fibrillation for which he was placed  on IV heparin to Coumadin.  The patient continues to suffer from  significant right hemiparesis and language deficits impairing his  functional mobility and self care.  It was also decided to bring in to  rehab today.   REVIEW OF SYSTEMS:  Noted in detail in the written H&P.  Other pertinent  positives are above.   PAST MEDICAL HISTORY:  Positive for appendectomy, rare alcohol use, and  pipe smoking.   FAMILY HISTORY:  Positive for stroke.   SOCIAL HISTORY:  The patient is married, lives in [place], and works as a  Consulting civil engineer.  Wife can assist him as needed except for heavy  lifting.  Patient was completely independent prior to arrival.   ALLERGIES:  NONE.   HOME MEDICATIONS:  None.   LABS:  Include hemoglobin 14.1, white count 9.3, platelets 304,000,  sodium 135, potassium 3.7, BUN 9, creatinine 1.1.   PHYSICAL EXAMINATION:  VITAL SIGNS:  Blood pressure is 114/77, pulse is  56, respiratory rate 20, temperature is 97.7.  GENERAL:  Patient is pleasant, no acute distress.  Patient is alert and  oriented x3.  EAR/NOSE/THROAT EXAM:  Is remarkable for slightly  dry mucosa otherwise  intact.  NECK:  Supple without JVD or lymphadenopathy.  CHEST:  Clear to auscultation bilaterally without wheezes, rales, or  rhonchi.  HEART:  Regular rate and rhythm without murmur, rubs, or gallops.  ABDOMEN:  Soft, nontender, bowel sounds are positive.  SKIN:  Was generally intact.  NEUROLOGICAL EXAMINATION:  Was notable for mild right facial droop,  although this is minimal.  Reflexes are 1+ in upper and lower  extremities.  Right upper extremity sensation was 0 to trace/2.  Right  lower extremity sensation was 1+/2.  Right motor function was 0 to trace  in the upper extremity.  Right lower extremity was 1/5 at the hips and  knee, 1+/5 at the ankle.  Left sided motor function diffusely was 5/5.  Patient's language was notable for expressive aphasia with a likely  receptive component as well.  Speech was generally garbled, mood was  fair, was unable to assess memory.  Patient was able to follow most  simple commands on examination today.  EXTREMITIES:  Showed no clubbing, cyanosis, or edema.  Calves were  nontender.   ASSESSMENT AND PLAN:  1. Functional deficit secondary to left temporoparietal infarct with      right hemiparesis and aphasia.  The patient will need inpatient      rehab with Physical Therapy to assess and treat for lower extremity      strengthening, range of motion, and balance.  Occupational Therapy      will assess and treat for upper extremities and activities of daily      living.  Speech will follow for aphasia, swallowing, and cognitive      issues.  A 24-hour rehab nurse will assess and treat for bladder,      bowel, skin, medication, and safety issues.  Rehab case      manager/social worker will assess for psychosocial and disposition      needs.  Estimated length of stay is 2 weeks.  Goal supervision.      Prognosis fair to good.  2. Pain management with Tylenol.  3. Anticoagulation:  Continue intravenous heparin to Coumadin.  4.  Atrial fibrillation:  See #3.  Continue Lopressor and Lanoxin.  5. Hyperlipidemia:  Zocor.      Ranelle Oyster, M.D.  Electronically Signed     ZTS/MEDQ  D:  07/12/2006  T:  07/13/2006  Job:  [id number]

## 2010-11-21 NOTE — Discharge Summary (Signed)
NAME:  RUTLEDGE, SELSOR NO.:  [id number]   MEDICAL RECORD NO.:  [id number]          PATIENT TYPE:  INP   LOCATION:  3004                         FACILITY:  [facility]   PHYSICIAN:  Pramod P. Pearlean Brownie, MD    DATE OF BIRTH:  [birth year]   DATE OF ADMISSION:  07/03/2006  DATE OF DISCHARGE:  07/12/2006                               DISCHARGE SUMMARY   DIAGNOSIS AT TIME OF DISCHARGE.:  1. 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  2. Atrial fibrillation.  3. Left ventricular clot.  4. Dyslipidemia.  5. Likely sleep apnea.  6. Hiatal hernia.  7. Appendectomy.  8. Coag negative staph urinary tract infection.   MEDICATIONS AT TIME OF DISCHARGE.:  1. Lipitor 10 mg a day.  2. Coumadin per pharmacy protocol.  3. IV heparin per pharmacy protocol.  4. Lopressor 75 mg b.i.d.  5. Lisinopril 5 mg daily.  6. Lanoxin 0.125 mg a day.  7. Normal saline at [facility] with IV heparin.   STUDIES PERFORMED.:  1. CT of the brain on admission shows acute/subacute left middle      cerebral artery distribution infarct involving the left temporal      and parietal lobes.  No hemorrhagic transformation.  2. MRI of the brain shows acute left MCA territory infarct effecting      large area of the left temporal lobe.  Mild swelling of micro      hemorrhage within the infarct.  Post contrast enhancement indicates      it is days old.  3. MRA of the brain shows severe trifurcation disease left MCA branch      or branches as well as severe narrowing of the A1 segment right      anterior cerebral artery.  Poor flow related enhancement in the      posterior cerebral artery throughout its entire course likely      related to atherosclerosis.  4. CT of the brain follow up two days later shows the patient has now      developed a left posterior cerebral artery infarct in addition to      the previously described  left middle cerebral artery infarct.      Multiple small infarcts in the basal ganglia on the left and      possible right internal capsule.  A 2 mm midline shift from left to      right now exists.  There is no hemorrhage but there is fairly      extensive obliteration of the cortical sulci at the region of the      infarct.  5. CT of the brain on 01/02 showed subacute left MCA and PCA infarct,      medial extension into the left parietal and occipital lobes.  Mild      subfalcine herniation.  Chronic microvascular ischemic changes.  6. Chest x-ray shows low inspiratory effort with possible atelectasis      of the right  lung base.  7. Carotid Doppler shows left 40-60% ICA stenosis lower end of the      range.  No significant right ICA stenosis.  Vertebral artery flow      antegrade bilaterally but abnormal left vertebral wave forms      suggest distal occlusion high-grade stenosis.  8. 2-D echocardiogram shows globular structure on endocardial surface      of distal posterior wall.  Cannot exclude thrombus, overall left      ventricular systolic function at lower limits of normal.  No      diagnostic evidence of left ventricular regional wall motion      abnormalities.  Left ventricular wall thickness within the upper      limits of normal.  Mild calcific changes at aortic root.  Apparent      velamentous structures extending from valve to LA which is probably      artifact.  Transesophageal echocardiogram recommended.  9. EKG shows atrial fibrillation with a rapid ventricular response.      This is new on 12/31.  EKG on admission showed normal sinus rhythm.  10.Transesophageal echocardiogram shows moderate global reduction in      the EF at 30-40%.  There is echogenic areas of apex of posterior      lateral wall, cannot exclude thrombus.  This was performed by Dr.      Madolyn Frieze. Crenshaw.   LABORATORY STUDIES:  INR on day of discharge 1.5.  CBC with RDW 14.2,  otherwise normal.   Differential normal.  Sodium 135, potassium 3.7,  chloride 105, CO2 23, glucose 118, BUN 9, creatinine 1.1, calcium 9.1.  Liver function tests normal.  Hemoglobin A1c 6.1.  __________  11.5.  Cardiac enzymes with troponin 0.22 and 0.24.  CK-MB is normal.  Cholesterol 205, triglycerides 70, HDL 49 and LDL 142.  TSH is normal.  Urine drug screen is normal.  Urinalysis with 3 to 6 white blood cells,  0 to 3 red blood cells and many bacteria.  Addition urine culture on  12/29 grew out 50,000 colonies of lactobacillus species.  Follow-up  urine culture on 07/07/2006 showed greater than 100,000 colonies staph  species, coag negative, resistant to penicillin but otherwise sensitive.   HISTORY OF PRESENT ILLNESS:  This is a 71 year old right-handed  Caucasian male with history of pipe smoking but no other risk factors  for stroke.  The patient awakened the day of admission and seemed to be  in his usual state of health but was in an angry mood and complaining  about his 41 year old daughter coming in late.  He spoke with his wife,  ate breakfast and went off to work.  Between 9 and 10 a.m. they had  phone conversation and he was clearly unable to express his thoughts.  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  He began stumbling.  When his wife asked him  what was wrong he began to cry.  His wife called 9-1-1.  He arrived at  [facility] at [phone].  CT of the brain showed an acute  subacute infarct in the left temporal parietal region.  Dr. Sharene Skeans was  called and reviewed the CT.  The CT results do not match with a history  of stroke less than 12 hours of age.  MRI of the brain was requested  which showed increasing or evolving, largely the left temporal and  inferior parietal region with increasing signal and diffusion ADC and  ABC and potential T2 flare and also gadolinium.  Echo further showed petechial blood purulent.   Clearly it was a subacute stroke of at least  24-72 hours of duration based on MRI criteria.  The patient was beyond a  3-hour TPA window and the window for any other neural protective  studies.  He is at high risk for hemorrhage.  IV heparin was held at  this point even though the stroke was felt to be embolic.  The patient  was admitted to the hospital for further evaluation.   HOSPITAL COURSE:  The patient developed new onset atrial fibrillation  during the night with hrs in the 150s to 170s.  He was started on IV  Cardizem and transferred to the unit.  Cardiology consult was asked to  see.  Along with new onset atrial fibrillation, the patient was found to  have a clot in his left ventricle, initially by a 2-D echocardiogram  followed by transesophageal echocardiogram.  Secondary to the clot the  patient was placed on IV heparin for secondary stroke prevention  realizing he is at bleeding risk.  Cardiology plans a Myoview once he is  recovered from his stroke and repeat his echo in one to three months.  In the meantime, the patient needs inpatient rehab.  They evaluated him  and felt he was a good candidate.  Plans are to transfer him there with  eventual return home with family.  Of note, the patient also appears to  have sleep apnea.  This will also need to be followed up after  discharge.   CONDITION ON DISCHARGE:  The patient alert and oriented to person and  place.  He has mixed aphasia with anomia and paraphasia.  He is unable  to repeat.  He is unable to name.  He is dysarthric.  He can say yes and  no.  He follows occasional simple commands.  He has a right facial  weakness.  Right homonymous hemianopsia.  His chest is clear to  auscultation and heart rate is irregularly irregular.  His right upper  extremity is 0/5.  His right lower extremity is 2/5.  His left strength  is normal.  He has increased sensation on the right.   DISCHARGE/PLAN:  1. Transfer to rehab for  continuation of PT, OT and speech therapy.  2. IV heparin, transition to Coumadin for secondary stroke prevention.  3. Follow up with cardiologist for Myoview and echocardiogram      repetition.  4. Dr. Jens Som in 3 months.  5. Follow-up with Dr. Delia Heady in two to three months.  6. Consider sleep apnea testing after discharge.  7. The patient will need UTI treatment on rehab.      Annie Main, N.P.    ______________________________  Sunny Schlein. Pearlean Brownie, MD    SB/MEDQ  D:  07/12/2006  T:  07/12/2006  Job:  [id number]   cc:   Pramod P. Pearlean Brownie, MD  Dr. Cherylann Parr

## 2010-11-21 NOTE — Assessment & Plan Note (Signed)
DATE OF VISIT:  09/05/2006   Darrell Hayes is back regarding his left temporoparietal stroke.  He has been at  home and is now about to start out patient therapies.  He is walking  around the house with a hemi-walker.  He has had a couple near falls but  has not fallen and generally has been improving with gait.  He still has  expressive language deficits but is able to convey needs by gesturing  and with extra time and cueing.  The patient denies pain.  He is  sleeping well.  He is not having any problems with appetite.  He is  continent of bowel and bladder.  He remains on Baclofen for spasms.   REVIEW OF SYSTEMS:  The patient does report some loss of taste but  otherwise is stable.  Full reviews in health and history section under  pertinent positives or above.   SOCIAL HISTORY:  The patient is married.  There is some stress involved  as they are being forced to move off of their land that they lived on  for many years.   PHYSICAL EXAMINATION:  VITAL SIGNS:  Blood pressure 116/60, pulse 75,  respiratory rate 16, 02 sat 96% on room air.  GENERAL:  The patient is pleasant, no acute distress.  NEUROLOGIC:  He is alert and oriented x3 with cueing, although sometimes  this was difficult due to aphasia.  Affect was bright.  The patient  walks with a slight foot drop and choppy gait on the right side.  He  does have near 5 out of 5 movements of the right leg and arm but lacks  fluidity.  I would say he is weakest at the right hand with grip which  is 3+ to 4 out of 5.  SENSORY EXAM:  Grossly intact with withdrawal to  pain and pin prick.  HEART:  Regular.  CHEST:  Clear.  ABDOMEN:  Soft, nontender.  The patient's weight appears stable.  SKIN:  Was intact.   ASSESSMENT:  1. Left temporoparietal infarct.  Continue with therapy on an      outpatient basis.  He needs continued repetition and use of the      right arm and leg as well as work on his speech as there is obvious      apraxia  involved.  2. Atrial fibrillation.   PLAN:  1. Continue Coumadin.  2. We will maintain patient on Baclofen for the time being as he is      doing fairly well with this.  3. The patient may be a candidate for constraint-induced therapies at      some point, especially when his balance improves.  4. May benefit from right AFO.  5. I will see the patient back in about 3 months' time.      Ranelle Oyster, M.D.  Electronically Signed     ZTS/MedQ  D:  09/06/2006 10:37:41  T:  09/06/2006 11:00:55  Job #:  [id number]

## 2010-11-21 NOTE — H&P (Signed)
NAME:  Darrell Hayes, Darrell Hayes NO.:  [id number]   MEDICAL RECORD NO.:  [id number]          PATIENT TYPE:  INP   LOCATION:  1824                         FACILITY:  [facility]   PHYSICIAN:  Deanna Artis. Hickling, M.D.DATE OF BIRTH:  1941/07/05   DATE OF ADMISSION:  07/03/2006  DATE OF DISCHARGE:                              HISTORY & PHYSICAL   CHIEF COMPLAINT:  Confusion.   HISTORY OF PRESENT ILLNESS:  A 70 year old right-handed married  Caucasian man with history of pipe smoking but no other known risk  factors for stroke.  Patient awakened today and seemed to be in his  usual state of health but was in an angry mood, complaining about his 60-  year-old daughter coming in late.  However, he spoke little with his  wife, ate breakfast, and went off to work.  Between 9:00 and 10:00, they  had phone conversation and he was clearly unable to express his  thoughts.  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  He began stumbling.  When his  wife asked him what was wrong, he began to cry.  His wife called 911, he  arrived at [facility] [place], CT ordered 2006, performed 2030, read by  radiology 2100 as an acute/subacute infarction involving the left  temporoparietal region.  I was called and reviewed the CT.  This did not  match with a history of stroke less then 12 hours of age.  I requested  an MRI scan of the brain, which showed increased signal involving  largely the left temporal and inferior parietal region with increased  signal in the diffusion, ADC, ADC exponential, T2, flare, and also  gadolinium.  Gradient echo focus showed petechial blood.  Clearly this  is a subacute stroke of at least 24 to 72 hours in duration based on MRI  criteria.  Therefore, the patient is beyond the windows of TPA  neuroprotective studies.  He is also at risk for further hemorrhage.   PAST MEDICAL HISTORY:  No chronic illnesses other then  possible hiatal  hernia.   PAST SURGICAL HISTORY:  Appendectomy.   MEDICATIONS:  None.   ALLERGIES:  NONE.   FAMILY HISTORY:  Positive for stroke in his father.  The gentleman was  in his 44s, he is now deceased.  His mother is 33 and has osteoporosis  and multiple fractures.  She is still living.   SOCIAL HISTORY:  Patient is married to his sixth wife.  He divorced his  first wife, the second through fourth have died, one in a motor vehicle  accident.  His current wife has been married to him for 14 years.  They  were friends for 3 years before that.  There are 2 children from former  marriage.  Patient is a pipe smoker.  He rarely drinks alcohol.  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  He works for [employer] in [place] and is a Education administrator.   PHYSICAL EXAMINATION:  VITAL SIGNS:  Blood pressure 161/92, resting  pulse 80, respirations 20, oxygen saturation 96% on room air,  temperature 98.2.  ENT:  No bruits.  NECK:  Supple, full range of motion.  LUNGS:  Clear.  HEART:  No murmurs.  Pulse is normal.  ABDOMEN:  Soft, bowel sounds normal.  No hepatosplenomegaly.  EXTREMITIES:  No edema or cyanosis.  NEURO:  NIH stroke scale was 5.  Cranial nerves round, reactive pupils,  visual fields full.  He distinguishes his right visual field to double  simultaneous stimuli.  Symmetric facial strength.  Midline tongue and  uvula.  Motor examination:  No drift, normal strength fine motor  movements normal.  Sensory examination:  No hemihypesthesia.  No clear  neglect or extinguishing to noxious stimuli.  I cannot test  stereognosis.  Cerebellar examination:  No tremor.  Gait was not tested.   IMPRESSION:  1. Subacute ischemic left cerebral artery branch occlusion involving      largely the temporal lobe with hemorrhagic transformation.  2. Hypertension.  3. Large vessel disease involving the left distal M2 and more      extensively the left posterior  cerebral artery.  His carotid      arteries and basalar arteries seem to be okay.  He has absent left      vertebral and a normal left vertebral, this is dominant.  Etiology      of the infarction is unknown.  May be embolic.  We will admit him      for an noninvasive vascular workup.  The patient does not want to      stay in the hospital.  I think that his family has persuaded him      the necessity to do so despite the fact that he is aphasic.   LABORATORY DATA:  Review of his labs to date includes:  White blood cell  count of 8700, hemoglobin 14.0, hematocrit 41.3, MCV 95.6, platelet  count 263,000, neutrophil 66, lymphocyte 25, monocyte 8.  Urinalysis  specific gravity 1.008, pH 7.5.  Chemistry is negative.  Alcohol level  was not detectable.  Urine drug screen was negative.  Sodium 139,  potassium 3.9, chloride 105, CO2 25, glucose 116, BUN 11, creatinine  1.1, calcium 9.3, total protein 6.7, albumin 37, AST 18, ALT 14,  alkaline phosphatase 67.  Total bilirubin 0.8.  Prothrombin time 13.9,  INR 1.1, PTT 33.  Remainder of his laboratories were pending at this  time.  EKG showed a normal sinus rhythm with premature ventricular  contractions and fusion complexes.  Otherwise regular rhythm.   PLAN:  Hold off on heparin because of the risk of bleeding.  Alos use  sequential compression stockings for DVT prophylaxis if he does not  ambulate early.  Workup as outined in the order set.  Patient passed his  swallowing study and can take a low salt regular diet.  He smokes a pipe  and will need counseling concerning cessation.      Deanna Artis. Sharene Skeans, M.D.  Electronically Signed     WHH/MEDQ  D:  07/04/2006  T:  07/04/2006  Job:  [id number]   cc:   Randie Heinz

## 2010-11-21 NOTE — Consult Note (Signed)
NAME:  Darrell Hayes, Darrell Hayes NO.:  [id number]   MEDICAL RECORD NO.:  [id number]          PATIENT TYPE:  INP   LOCATION:  3039                         FACILITY:  [facility]   PHYSICIAN:  Madolyn Frieze. Jens Som, MD, FACCDATE OF BIRTH:  11-Mar-1941   DATE OF CONSULTATION:  07/05/2006  DATE OF DISCHARGE:                                 CONSULTATION   HISTORY:  The patient is a 70 year old male with a past medical history  of tobacco abuse and now new onset CVA, who we are asked to evaluate for  atrial fibrillation.  He apparently has no prior cardiac history.  Note,  he has an expressive aphasia and is very difficult to understand.  He  was admitted on July 03, 2006, with the above neurological issues  (expressive aphasia and right-sided weakness). He was found to have an  acute CVA and an MRI has also revealed microhemorrhage.  Overnight, he  has developed atrial fibrillation with a rapid ventricular response and  cardiology is asked to evaluate.  Note, the patient typically does not  had dyspnea on exertion, orthopnea, PND, pedal edema, palpitations,  presyncope, syncope, or chest pain, and he is asymptomatic with his  atrial fibrillation.   PRESENT MEDICATIONS:  1. Toprol 50 mg p.o. daily.  2. Zocor 20 mg p.o. daily.  3. He was on no medications prior to admission.   THERE IS NO KNOWN DRUG ALLERGIES.   SOCIAL HISTORY:  He does smoke.  He rarely consumes alcohol.   FAMILY HISTORY:  Negative for coronary artery disease.   PAST MEDICAL HISTORY:  Significant for an appendectomy.  However, he has  no other known past medical history prior to this admission.   REVIEW OF SYSTEMS:  He denies any headaches, fevers or chills.  There is  no productive cough or hemoptysis.  There is no dysphagia, odynophagia,  melena, hematochezia.  There is no dysuria, hematuria.  There is no  history of seizure activity.  There is no orthopnea, PND, or pedal  edema.  He does complain of  difficulty with speech as well as right-  sided weakness.  The remainder of systems are negative.   PHYSICAL EXAMINATION:  VITAL SIGNS:  Shows a blood pressure of 144/70  and his pulse is 142.  He is afebrile.  GENERAL:  He is well developed and well nourished and does not appear to  be in acute distress.  His skin is warm and dry.  He does not appear to  be depressed, although somewhat frustrated due to his inability to  communicate.  BACK:  Normal.  HEENT:  Unremarkable with normal eyelids.  NECK:  Supple, with normal upstroke bilaterally.  I cannot appreciate  bruits. There is no jugular venous distention.  I cannot appreciate  thyromegaly.  CHEST:  Clear to auscultation, normal expansion.  CARDIOVASCULAR EXAM:  Reveals a nondisplaced PMI.  There is a  tachycardic rate and an irregular rhythm.  I cannot appreciate murmurs,  rubs, or gallops.  ABDOMINAL EXAM:  Nontender, nondistended.  Positive bowel sounds.  No  hepatosplenomegaly.  No mass appreciated.  There is no abdominal bruit.  EXTREMITIES:  He had 2+ femoral pulses bilaterally and no bruits.  Showed no edema.  I can palpate no cords.  He had 2+ dorsalis pedis  pulses bilaterally.  NEUROLOGICAL EXAM:  Shows an expressive aphasia.  There is also right-  sided weakness in the arm and leg.   LABORATORY DATA:  His laboratories shows a hemoglobin of 14, hematocrit  of 41.3.  His platelet count is 263.  His sodium is 135, with potassium  of 3.7.  his BUN and creatinine are 9 and 1.1.  His troponin I is 0.24  and 0.22 respectively.  His LDL is 142.  As previously stated, his MRI  reveals an acute left MCA territory infarct involving a large area of  the left temporal lobe.  There was microhemorrhage.  Electrocardiogram  shows atrial fibrillation with a rapid ventricular response.   DIAGNOSES:  1. Atrial fibrillation.  2. Recent large cerebrovascular accident with microhemorrhage.  3. Tobacco abuse.  4. Probable hypertension.   5. Hyperlipidemia.   PLAN:  Mr. Pellot has been admitted with a large CVA.  He has developed  atrial fibrillation with a rapid ventricular response.  We will continue  with his Toprol and I will add Cardizem for rate control.  Will check an  echocardiogram to quantify his left ventricular function as well as a  TSH.  His increased troponin is most likely secondary to his atrial  fibrillation but he will need a Myoview after he recovers from his CVA.  I will discuss the timing of anticoagulation with neurology.  He clearly  needs life-long Coumadin but he had microhemorrhage around his large CVA  and this may need to be delayed.  We will be happy to follow while he is  in the hospital.      Madolyn Frieze. Jens Som, MD, [facility]  Electronically Signed     BSC/MEDQ  D:  07/05/2006  T:  07/06/2006  Job:  [id number]

## 2010-11-21 NOTE — Assessment & Plan Note (Signed)
[facility]                            CARDIOLOGY OFFICE NOTE   NAME:Hayes, Darrell                         MRN:          [id number]  DATE:10/07/2006                            DOB:          [birth year]    [name] returns for follow up today.  He is a gentleman who has a  history of atrial fibrillation complicated by a CVA.  He is presently  undergoing rehabilitation for his CVA.  He also had a increased troponin  at the time of his CVA.  We did perform a Myoview on August 26, 2006.  His ejection fraction was 48%, but the left ventricular visually  appeared better than the calculated ejection fraction.  There was  inferior and apical thinning, but no scar or ischemia was noted.  Also  note, he has had a previous echocardiogram that showed question of  globular obstruction within the cardial surface of the distal posterior  wall.  Since he was seen previously, he denies any dyspnea, chest pain,  presyncope, syncope or pedal edema.  He does occasionally feel his heart  skip.  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   CURRENT MEDICATIONS:  1. Coumadin as directed.  2. Lisinopril 5 mg p.o. daily.  3. Zocor 20 mg p.o. daily.  4. Toprol 25 mg daily.  5. Baclofen 10 mg p.o. t.i.d.   PHYSICAL EXAMINATION:  Blood pressure 126/74 and his pulse at 71.  Weight is 173 pounds.  NECK:  Supple.  CHEST:  Clear.  CARDIOVASCULAR:  Regular rate and rhythm.  ABDOMINAL:  No palpable masses, no bruits.  EXTREMITIES:  Show no edema.   DIAGNOSES:  1. History of atrial fibrillation, now in normal sinus rhythm.  2. History of increased troponin, but with negative Myoview.  3. Prior CVA in the setting of atrial fibrillation.  4. Coumadin therapy.  5. Hypertension.   PLAN:  [name] is doing well from a symptomatic standpoint.  His most  recent Myoview showed inferior and apical thinning, but no ischemia.  We  will continue with medical therapy.  He will need  life long Coumadin  given his history of CVA in the setting of atrial fibrillation.  He will  continue on his Toprol for the atrial fibrillation as well.  I will plan  to repeat his echocardiogram to  make sure there is no globular obstruction in the distal posterior wall.  He will continue with his rehab following a CVA and I will see him back  in 6 months.     Darrell Frieze Jens Som, MD, [facility]  Electronically Signed    BSC/MedQ  DD: 10/07/2006  DT: 10/07/2006  Job #: [id number]   cc:   Darrell Hayes

## 2010-11-21 NOTE — Assessment & Plan Note (Signed)
[facility]                            CARDIOLOGY OFFICE NOTE   NAME:Darrell Hayes, Darrell Hayes                         MRN:          [id number]  DATE:08/19/2006                            DOB:          January 07, 1941    Mr. Savo is a very pleasant 70 year old gentleman who was recently  admitted to [facility] with a CVA.  He was found to be in  atrial fibrillation with a rapid ventricular response.  We did place the  patient on rate control medicines and anticoagulation.  He underwent an  echocardiogram that was interpreted by Dr. Dietrich Pates on July 05, 2006.  There was a globular structure on the endocardial surface of the  distal posterior wall, could not exclude thrombus.  There were  ?Filaments projections from the mitral valve into the LA that was felt  to be artifact.  However the patient also underwent transesophageal  echocardiogram.  His ejection fraction was felt to be 30-40%, with  moderate hypokinesis.  There was an echogenic area of the posterolateral  wall and thrombus could not be excluded.  The patient has been in rehab  since discharge and done well.  He denies any dyspnea, chest pain,  palpitations or syncope.  Of note, he did have low heart rates and we  discontinued his Digoxin.  He has been seen by his primary care  physician and his Toprol has also been decreased.   MEDICATIONS:  His present medications include:  1. Coumadin as directed.  2. Lisinopril 5 mg p.o. daily.  3. Zocor 20 mg p.o. q.h.s.  4. Toprol 25 p.o. daily.  5. Baclofen.   PHYSICAL EXAMINATION:  Blood pressure 102/68 and his pulse is 59.  Weight is 179 pounds.  CHEST:  Clear.  CARDIOVASCULAR:  Regular rate and rhythm.  EXTREMITIES:  No edema.   His electrocardiogram shows sinus rhythm at a rate of 59.  There were no  ST changes noted.   DIAGNOSES:  1. Recent atrial fibrillation, now in normal sinus rhythm.  2. Increased Troponin I when he was in the  hospital.  3. Recent CVA.  4. Tobacco abuse, now resolved.  5. History of hypertension.   PLAN:  Mr. Bolger is doing very well from a symptomatic standpoint.  We  will schedule him for an Adenosine Myoview given his recent increased  Troponin that I think is most likely related to his atrial fibrillation.  If it shows normal perfusion, we will continue with medical therapy.  Note:  He did have a CVA in relation to his atrial fibrillation and he  will need life-long Coumadin.  Dr. Lacie Scotts is following his INRs.  His  heart rate is acceptable today and we will continue off of  Digoxin.  We will need to repeat his echocardiogram in 2 months when I  see him back in [place].     Madolyn Frieze Jens Som, MD, [facility]  Electronically Signed    BSC/MedQ  DD: 08/19/2006  DT: 08/19/2006  Job #: [id number]

## 2010-11-21 NOTE — Discharge Summary (Signed)
NAME:  Darrell Hayes, Darrell Hayes NO.:  [id number]   MEDICAL RECORD NO.:  [id number]          PATIENT TYPE:  IPS   LOCATION:  4036                         FACILITY:  [facility]   PHYSICIAN:  Ranelle Oyster, M.D.DATE OF BIRTH:  1941/02/24   DATE OF ADMISSION:  07/12/2006  DATE OF DISCHARGE:  07/29/2006                               DISCHARGE SUMMARY   DISCHARGE DIAGNOSES:  1. Left temporoparietal infarction.  2. Coumadin therapy for cerebrovascular accident prophylaxis well as      atrial fibrillation.  3. Atrial fibrillation.  4. Hyperlipidemia.  5. Staphylococcus coagulase-negative urinary tract infection.  6. History of tobacco abuse.   A 70 year old right-handed white male admitted December 29 with slurred  speech on no present medications.  Cranial CT scan with acute to  subacute infarction, left temporoparietal region.  Carotid Dopplers  negative.  MRI with left middle cerebral artery infarction with left  cerebral artery branch occlusion.  Echocardiogram with decreased left  ventricular function.  TEE with 30-40% ejection fraction.  Bouts of  atrial fibrillation, follow up [facility], placed on intravenous  heparin and Coumadin for both CVA prophylaxis well as atrial  fibrillation.  ACE inhibitor added for decreased left ventricular  function.  Plan was for Myoview as an outpatient.  He was admitted for a  comprehensive rehab program.   PAST MEDICAL HISTORY:  See discharge diagnoses.   Rare alcohol.  He is a pipe smoker.   ALLERGIES:  None.   SOCIAL HISTORY:  Married.  He lives in [place].  He works maintenance.  Wife can assist as needed but limited for lifting.   MEDICATIONS PRIOR TO ADMISSION:  None.   REHABILITATION HOSPITAL COURSE:  The patient was admitted to inpatient  rehab services with therapies initiated on a 3-hour daily basis  consisting of physical therapy, occupational therapy, speech therapy and  rehabilitation nursing.  The following  issues were addressed during the  patient's rehabilitation stay.  Pertaining to Mr. Heeg's left  temporoparietal infarction, remained stable.  Maintained on Coumadin  therapy.  Functionally he was minimal assist for bathing, supervision  upper body dressing, minimal assist lower body dressing.  Transfers from  wheelchair to mat with moderate assist, ambulating max assist of two 30  feet, minimal assist for wheelchair mobility.  He still was aphasic,  required maximum assistance with his expression, moderate assist overall  for communication skills.  He was maintained on a regular diet.  He  would remain on Coumadin therapy for both CVA prophylaxis, atrial  fibrillation, to be followed by Dr. Victoriano Lain of [place], [place],  [phone], as well as follow-up with cardiology service, Dr. Jens Som,  for his atrial fibrillation.  His heart rate was monitored during his  rehabilitation stay.  His Lopressor was discontinued July 15, 2006,  placed on 200 mg daily of Toprol, which was decreased to 100 mg on  January 15, to 50 mg on January 17 due to some bradycardia into the mid  to upper 40s.  He remained asymptomatic.  Again it was stressed the need  to follow up with cardiology services  for outpatient Myoview.  During  his rehabilitation stay he was treated with a 7-day course of  Macrodantin for a staph coagulase-negative urinary tract infection.  He  remained on Zocor for hyperlipidemia.  He did have a history of pipe  smoking.  It was discussed at length with the family the need for  cessation of any smoking products.  He was discharged to home with  family.   Latest labs showed an INR of 3.1, hemoglobin 13.7, hematocrit 39.6,  platelet 359,000.  Sodium 134, potassium 4.9, BUN 18, creatinine 1.1.   DISCHARGE MEDICATIONS AT TIME OF DICTATION:  1. Coumadin with latest dose of 5 mg.  This was adjusted accordingly      with GOAL INR of 2.0-3.0.  2. Lanoxin 0.125 mg daily.  3.  Lisinopril 5 mg daily.  4. Zocor 20 mg daily.  5. Toprol XL 50 mg daily.  6. Baclofen 10 mg three times daily.  7. Tylenol 650 mg p.o. every 4 hours as needed pain or fever.   ACTIVITY:  As tolerated with assistance for safety.   DIET:  Regular.   SPECIAL INSTRUCTIONS:  The patient should have a home health nurse for  prothrombin time weekly with results to Dr. Victoriano Lain  of [place],  [place], [phone], fax number [phone].  The patient should  follow up with Dr. Ellin Goodie cardiology services for his atrial  fibrillation and plan for outpatient Myoview.  Follow up with Dr.  Faith Rogue at the outpatient rehab service office, appointment to be  made.  Dr. Sharene Skeans, [phone], neurology services.      Mariam Dollar, P.A.      Ranelle Oyster, M.D.  Electronically Signed    DA/MEDQ  D:  07/28/2006  T:  07/28/2006  Job:  [id number]   cc:   Deanna Artis. Sharene Skeans, M.D.  Dr. Tye Savoy S. Jens Som, MD, [facility]

## 2010-11-24 ENCOUNTER — Telehealth: Payer: Self-pay | Admitting: *Deleted

## 2010-11-24 ENCOUNTER — Ambulatory Visit (INDEPENDENT_AMBULATORY_CARE_PROVIDER_SITE_OTHER): Payer: Medicare Other | Admitting: Family Medicine

## 2010-11-24 ENCOUNTER — Other Ambulatory Visit: Payer: Self-pay | Admitting: Family Medicine

## 2010-11-24 ENCOUNTER — Encounter: Payer: Self-pay | Admitting: Family Medicine

## 2010-11-24 ENCOUNTER — Other Ambulatory Visit (INDEPENDENT_AMBULATORY_CARE_PROVIDER_SITE_OTHER): Payer: Medicare Other | Admitting: Family Medicine

## 2010-11-24 VITALS — BP 104/64 | HR 126 | Temp 98.6°F

## 2010-11-24 DIAGNOSIS — R7309 Other abnormal glucose: Secondary | ICD-10-CM

## 2010-11-24 DIAGNOSIS — Z7901 Long term (current) use of anticoagulants: Secondary | ICD-10-CM

## 2010-11-24 DIAGNOSIS — L8990 Pressure ulcer of unspecified site, unspecified stage: Secondary | ICD-10-CM

## 2010-11-24 DIAGNOSIS — Z5181 Encounter for therapeutic drug level monitoring: Secondary | ICD-10-CM

## 2010-11-24 DIAGNOSIS — E039 Hypothyroidism, unspecified: Secondary | ICD-10-CM

## 2010-11-24 DIAGNOSIS — I4891 Unspecified atrial fibrillation: Secondary | ICD-10-CM

## 2010-11-24 DIAGNOSIS — L89309 Pressure ulcer of unspecified buttock, unspecified stage: Secondary | ICD-10-CM

## 2010-11-24 LAB — TSH: TSH: 6.25 u[IU]/mL — ABNORMAL HIGH (ref 0.35–5.50)

## 2010-11-24 LAB — POCT INR: INR: 3

## 2010-11-24 MED ORDER — METOPROLOL SUCCINATE ER 25 MG PO TB24: 25.0000 mg | ORAL_TABLET | Freq: Two times a day (BID) | ORAL | Status: DC

## 2010-11-24 MED ORDER — DOXYCYCLINE HYCLATE 100 MG PO TABS: 100.0000 mg | ORAL_TABLET | Freq: Two times a day (BID) | ORAL | Status: AC

## 2010-11-24 MED ORDER — WARFARIN SODIUM 5 MG PO TABS: 2.5000 mg | ORAL_TABLET | Freq: Every day | ORAL | Status: DC

## 2010-11-24 NOTE — Progress Notes (Signed)
See notes re: coumadin and abx on OV note from MD.

## 2010-11-24 NOTE — Patient Instructions (Signed)
You should get a call about going to the cardiology clinic later this week.  Take the doxycycline twice a day.   Take 1/2 of the warfarin tabs a day Take toprol XL 25mg  twice a day.   Try to get a pillow in the seat of the recliner to take some pressure off.

## 2010-11-24 NOTE — Progress Notes (Signed)
Her for boils on buttocks.  No fevers.  Tender.  Inc in pain and breakouts as more time spent sitting/laying down and pressure increased.  Some drainage per daughter.  No CP.  Not SOB at baseline.  Mild inc in edema in R ankle noted by daughter.  H/o AF  nad ncat IRR, tachy Lungs ctab Buttocks with multiple resolving epithelial disruptions but 1 is tender, about 2cm in diameter in total with central drainage, located on medial R leg.  Local but no sig spreading erythema noted. R ankle with trace edema. Ext well perfused.

## 2010-11-24 NOTE — Assessment & Plan Note (Signed)
He doesn't appear toxic and doesn't appear to need admission.  I would start BB and then fu with cards.  Pt and daughter understand.  They agree.

## 2010-11-24 NOTE — Telephone Encounter (Signed)
Patient's daughter says that patient has a boil in between his legs that is bleeding. She wants him seen today. We have no appt with any provider. Can he be added on. Please advise.

## 2010-11-24 NOTE — Assessment & Plan Note (Addendum)
>  25 min spent with face to face with patient counseling and coordinating care.  I talked to Dr. Shirlee Latch about the warfarin, abx, and toprol.   Cut the warfarin to 1/2 tab a day, start abx and use a pillow to take some of the pressure off.  They'll fu with me as needed after seeing cards.  I appreciate cards help.

## 2010-11-24 NOTE — Telephone Encounter (Signed)
It looks like he's on at 3. If this is correct, this if fine with me.

## 2010-11-24 NOTE — Telephone Encounter (Signed)
Yes, appt has been scheduled.

## 2010-11-25 ENCOUNTER — Other Ambulatory Visit: Payer: Self-pay

## 2010-11-26 ENCOUNTER — Encounter: Payer: Self-pay | Admitting: Cardiology

## 2010-11-26 DIAGNOSIS — I10 Essential (primary) hypertension: Secondary | ICD-10-CM

## 2010-11-27 ENCOUNTER — Encounter: Payer: Self-pay | Admitting: Cardiology

## 2010-11-27 ENCOUNTER — Telehealth: Payer: Self-pay | Admitting: Cardiology

## 2010-11-27 ENCOUNTER — Ambulatory Visit (INDEPENDENT_AMBULATORY_CARE_PROVIDER_SITE_OTHER): Payer: Medicare Other | Admitting: Cardiology

## 2010-11-27 DIAGNOSIS — I4891 Unspecified atrial fibrillation: Secondary | ICD-10-CM

## 2010-11-27 DIAGNOSIS — I2510 Atherosclerotic heart disease of native coronary artery without angina pectoris: Secondary | ICD-10-CM

## 2010-11-27 MED ORDER — DIGOXIN 125 MCG PO TABS: 125.0000 ug | ORAL_TABLET | Freq: Every day | ORAL | Status: DC

## 2010-11-27 MED ORDER — DIGOXIN 125 MCG PO TABS: ORAL_TABLET | ORAL | Status: DC

## 2010-11-27 NOTE — Assessment & Plan Note (Signed)
Continue statin. 

## 2010-11-27 NOTE — Telephone Encounter (Signed)
Called pharmacy and advised that Dr.Crenswaw is aware the patient is taking Amiodarone and Dig.

## 2010-11-27 NOTE — Assessment & Plan Note (Signed)
The patient continues to have paroxysmal atrial fibrillation. His rate is elevated. Continue amiodarone and metoprolol. Add digoxin - 0.25 mg x1 and repeat in 6 hours and then 0.125 mg p.o. Daily. Check digoxin level in 10 days. Follow closely given potential amiodarone interaction. Continue Coumadin. I am concerned about continuing this medicine with his falls but he did have a CVA previously from an embolic event related to his atrial fibrillation. I therefore feel the benefit outweighs the risk.

## 2010-11-27 NOTE — Patient Instructions (Signed)
Start Digoxin today - take 0.25 mg now and again in 6 hours, then take 0.125 mg thereafter. Have a digoxin level in approximately 10 days. Continue all other medications as listed. Follow up with Dr Jens Som in 8 weeks.

## 2010-11-27 NOTE — Assessment & Plan Note (Signed)
Continue present medications. No recent chest pain.

## 2010-11-27 NOTE — Progress Notes (Signed)
HPI: [name] returns for followup today.  He has a history of atrial fibrillation that is paroxysmal.  He also had a previous stroke related to this and is on chronic Coumadin. Last echocardiogram was performed on September 18, 2008. LV function was normal. Trivial aortic insufficiency and mild left atrial enlargement. He underwent cardiac catheterization on September 26, 2008.  There was a 90% stenosis in the LAD proper. There was also an 80% stenosis in the distal LAD.  There was no other significant disease noted.  His ejection fraction was 55%.  The patient subsequently underwent PCI of the mid LAD lesion with a bare-metal stent.  There was a CT scan performed on September 10, 2008, that showed no pulmonary embolus.  There were reticular nodular opacities in the superior segment of the right lower lobe felt possibly secondary to an infectious or inflammatory process.  Pulmonary is now following this. Since I last saw him in March of 2012 the patient he does have dyspnea on exertion but denies orthopnea, PND, chest pain or syncope. He has mild edema in the right ankle. He has fallen from weakness on the right side from his previous CVA.  Current Outpatient Prescriptions  Medication Sig Dispense Refill  . amiodarone (PACERONE) 200 MG tablet Take 200 mg by mouth daily.        Marland Kitchen aspirin 81 MG tablet Take 81 mg by mouth daily.        . carbamazepine (TEGRETOL) 200 MG tablet Take 1 1/2 tabs by mouth twice daily  90 tablet  5  . cholecalciferol (VITAMIN D) 1000 UNITS tablet Take 1,000 Units by mouth daily.        Marland Kitchen docusate sodium (COLACE) 100 MG capsule Take 200 mg by mouth daily.        Marland Kitchen doxycycline (VIBRA-TABS) 100 MG tablet Take 1 tablet (100 mg total) by mouth 2 (two) times daily.  20 tablet  0  . escitalopram (LEXAPRO) 10 MG tablet Take 10 mg by mouth daily.        . fish oil-omega-3 fatty acids 1000 MG capsule Take 2 g by mouth daily.        . furosemide (LASIX) 20 MG tablet Take 20 mg by mouth daily.          Marland Kitchen HYDROcodone-acetaminophen (NORCO) 10-325 MG per tablet Take 1 tablet by mouth every 6 (six) hours as needed.        Marland Kitchen LORazepam (ATIVAN) 0.5 MG tablet Take 0.5 mg by mouth every 6 (six) hours as needed.        . metoprolol succinate (TOPROL XL) 25 MG 24 hr tablet Take 1 tablet (25 mg total) by mouth 2 (two) times daily.  60 tablet  1  . potassium chloride (KLOR-CON 10) 10 MEQ CR tablet Take 10 mEq by mouth daily.        . rosuvastatin (CRESTOR) 20 MG tablet Take 20 mg by mouth daily.        . sennosides-docusate sodium (SENOKOT-S) 8.6-50 MG tablet Take 2 tablets by mouth daily. And then up to 3 times a day as needed.       . sorbitol 70 % solution Take 2 tablespoonfuls every two hours as needed.       . warfarin (COUMADIN) 5 MG tablet Take 0.5 tablets (2.5 mg total) by mouth daily.         Past Medical History  Diagnosis Date  . Hyperlipidemia   . Hypertension   . Atrial  fibrillation     Coumadin therapy - Dr. Jens Som.  Cardioversion 08/23/09, Amiodarone Rx since 09/03/2009  . CVA (cerebral vascular accident) 2007    In the setting of atrial fibrillation  . Hiatal hernia   . CAD (coronary artery disease) 09/2008    Dr. Jens Som, stent to LAD  . Seizures 12/2008    Dr. Sharene Skeans  . Pulmonary nodules 09/2008    Nonspecific reticulonodular nodules RLL superior segment, resolved on CT 06/14/09  . Alveolar/parietoalveolar pneumonopathy, other     06/14/09 CT chest, Mild LLL GGO; 02/28/10 CT chest , improved  . Dyspnea 09/2008    Class 3, since 2008.  Significant tachycardia.  No PE or fibrosis on CT chest 3/10, 12/10, 02/2010, normal PFT but isolated low DLCO  06/2009, normal hgb, TSH, creat and albumin , Dec 2010.  Resolved after A Fib cardioversion and Amiodarone Marh 2011  . Hepatitis   . History of chickenpox   . Nonspecific abnormal results of pulmonary system function study   . Agitation     and irritability, situational exacerbation    Past Surgical History  Procedure Date  .  Appendectomy   . Stent placed 09/2008    History   Social History  . Marital Status: Legally Separated    Spouse Name: N/A    Number of Children: 2  . Years of Education: N/A   Occupational History  . Retired Education administrator from [employer]    Social History Main Topics  . Smoking status: Former Smoker -- 32 years    Types: Cigarettes, Pipe    Quit date: 11/27/2006  . Smokeless tobacco: Not on file  . Alcohol Use: No  . Drug Use: Not on file  . Sexually Active: Not on file   Other Topics Concern  . Not on file   Social History Narrative   Lives with daughter, Darrell Hayes [phone] who works 1st shift as does her husband.Regular exercise:  No ambulation with cane or walker.Has been disabled since his stroke.    ROS: no fevers or chills, productive cough, hemoptysis, dysphasia, odynophagia, melena, hematochezia, dysuria, hematuria, rash, seizure activity, orthopnea, PND,  claudication. Remaining systems are negative.  Physical Exam: Well-developed well-nourished in no acute distress.  Skin is warm and dry.  HEENT is normal.  Neck is supple. No thyromegaly.  Chest is clear to auscultation with normal expansion.  Cardiovascular exam is tachycardic and irregular. Abdominal exam nontender or distended. No masses palpated. Extremities show 1+ ankle edema on the right. neuro residual right-sided weakness from previous CVA  ECG Atrial fibrillation at a rate of 112. No ST changes.

## 2010-11-27 NOTE — Assessment & Plan Note (Signed)
Blood pressure controlled. I will not advance his metoprolol for his atrial fibrillation as his blood pressure is borderline. Instead we will add digoxin.

## 2010-12-04 ENCOUNTER — Telehealth (INDEPENDENT_AMBULATORY_CARE_PROVIDER_SITE_OTHER): Payer: Medicare Other | Admitting: Family Medicine

## 2010-12-04 DIAGNOSIS — E039 Hypothyroidism, unspecified: Secondary | ICD-10-CM

## 2010-12-04 MED ORDER — LEVOTHYROXINE SODIUM 25 MCG PO TABS: 25.0000 ug | ORAL_TABLET | Freq: Every day | ORAL | Status: DC

## 2010-12-04 NOTE — Telephone Encounter (Signed)
Please have patient start on levothyroid a day and recheck TSH in 6 weeks.  Thanks.

## 2010-12-05 NOTE — Telephone Encounter (Signed)
Patient daughter notified. Lab appt scheduled.

## 2010-12-05 NOTE — Telephone Encounter (Signed)
Message left for patient to return my call.  

## 2010-12-08 ENCOUNTER — Other Ambulatory Visit (INDEPENDENT_AMBULATORY_CARE_PROVIDER_SITE_OTHER): Payer: Medicare Other | Admitting: Family Medicine

## 2010-12-08 ENCOUNTER — Other Ambulatory Visit: Payer: Medicare Other

## 2010-12-08 DIAGNOSIS — G40909 Epilepsy, unspecified, not intractable, without status epilepticus: Secondary | ICD-10-CM

## 2010-12-08 DIAGNOSIS — I4891 Unspecified atrial fibrillation: Secondary | ICD-10-CM

## 2010-12-09 LAB — DIGOXIN LEVEL: Digoxin Level: 0.5 ng/mL — ABNORMAL LOW (ref 0.8–2.0)

## 2010-12-12 ENCOUNTER — Other Ambulatory Visit: Payer: Self-pay | Admitting: *Deleted

## 2010-12-12 MED ORDER — WARFARIN SODIUM 5 MG PO TABS: 2.5000 mg | ORAL_TABLET | Freq: Every day | ORAL | Status: DC

## 2010-12-12 MED ORDER — ROSUVASTATIN CALCIUM 20 MG PO TABS: 20.0000 mg | ORAL_TABLET | Freq: Every day | ORAL | Status: DC

## 2010-12-22 ENCOUNTER — Ambulatory Visit: Payer: Medicare Other

## 2010-12-23 ENCOUNTER — Emergency Department (HOSPITAL_COMMUNITY): Payer: Medicare Other

## 2010-12-23 ENCOUNTER — Encounter: Payer: Self-pay | Admitting: Family Medicine

## 2010-12-23 ENCOUNTER — Ambulatory Visit: Payer: Self-pay | Admitting: Family Medicine

## 2010-12-23 ENCOUNTER — Ambulatory Visit (INDEPENDENT_AMBULATORY_CARE_PROVIDER_SITE_OTHER): Payer: Medicare Other | Admitting: Family Medicine

## 2010-12-23 ENCOUNTER — Inpatient Hospital Stay (HOSPITAL_COMMUNITY)
Admission: EM | Admit: 2010-12-23 | Discharge: 2010-12-31 | DRG: 690 | Disposition: A | Discharge status: Skilled Nursing Facility | Payer: Medicare Other | Attending: Internal Medicine | Admitting: Internal Medicine

## 2010-12-23 VITALS — BP 100/60 | HR 46 | Temp 98.6°F

## 2010-12-23 DIAGNOSIS — I69992 Facial weakness following unspecified cerebrovascular disease: Secondary | ICD-10-CM

## 2010-12-23 DIAGNOSIS — I509 Heart failure, unspecified: Secondary | ICD-10-CM | POA: Diagnosis present

## 2010-12-23 DIAGNOSIS — I10 Essential (primary) hypertension: Secondary | ICD-10-CM | POA: Diagnosis present

## 2010-12-23 DIAGNOSIS — Z7901 Long term (current) use of anticoagulants: Secondary | ICD-10-CM

## 2010-12-23 DIAGNOSIS — I2510 Atherosclerotic heart disease of native coronary artery without angina pectoris: Secondary | ICD-10-CM | POA: Diagnosis present

## 2010-12-23 DIAGNOSIS — N390 Urinary tract infection, site not specified: Principal | ICD-10-CM | POA: Diagnosis present

## 2010-12-23 DIAGNOSIS — E860 Dehydration: Secondary | ICD-10-CM | POA: Diagnosis present

## 2010-12-23 DIAGNOSIS — I498 Other specified cardiac arrhythmias: Secondary | ICD-10-CM | POA: Diagnosis present

## 2010-12-23 DIAGNOSIS — R918 Other nonspecific abnormal finding of lung field: Secondary | ICD-10-CM | POA: Diagnosis present

## 2010-12-23 DIAGNOSIS — Z7982 Long term (current) use of aspirin: Secondary | ICD-10-CM

## 2010-12-23 DIAGNOSIS — N179 Acute kidney failure, unspecified: Secondary | ICD-10-CM | POA: Diagnosis present

## 2010-12-23 DIAGNOSIS — R4182 Altered mental status, unspecified: Secondary | ICD-10-CM

## 2010-12-23 DIAGNOSIS — Z9861 Coronary angioplasty status: Secondary | ICD-10-CM

## 2010-12-23 DIAGNOSIS — B961 Klebsiella pneumoniae [K. pneumoniae] as the cause of diseases classified elsewhere: Secondary | ICD-10-CM | POA: Diagnosis present

## 2010-12-23 DIAGNOSIS — I69920 Aphasia following unspecified cerebrovascular disease: Secondary | ICD-10-CM

## 2010-12-23 DIAGNOSIS — N289 Disorder of kidney and ureter, unspecified: Secondary | ICD-10-CM | POA: Diagnosis present

## 2010-12-23 DIAGNOSIS — I4891 Unspecified atrial fibrillation: Secondary | ICD-10-CM | POA: Diagnosis present

## 2010-12-23 DIAGNOSIS — M25519 Pain in unspecified shoulder: Secondary | ICD-10-CM | POA: Diagnosis present

## 2010-12-23 DIAGNOSIS — R609 Edema, unspecified: Secondary | ICD-10-CM | POA: Diagnosis present

## 2010-12-23 LAB — URINALYSIS, ROUTINE W REFLEX MICROSCOPIC
Bilirubin Urine: NEGATIVE
Glucose, UA: NEGATIVE mg/dL
Ketones, ur: 15 mg/dL — AB
Nitrite: POSITIVE — AB
Protein, ur: NEGATIVE mg/dL

## 2010-12-23 LAB — COMPREHENSIVE METABOLIC PANEL
ALT: 34 U/L (ref 0–53)
AST: 33 U/L (ref 0–37)
Albumin: 3.4 g/dL — ABNORMAL LOW (ref 3.5–5.2)
Alkaline Phosphatase: 98 U/L (ref 39–117)
Calcium: 9.2 mg/dL (ref 8.4–10.5)
GFR calc Af Amer: 58 mL/min — ABNORMAL LOW (ref >60)
Glucose, Bld: 109 mg/dL — ABNORMAL HIGH (ref 70–99)
Potassium: 4.5 mEq/L (ref 3.5–5.1)
Sodium: 137 mEq/L (ref 135–145)
Total Protein: 7.1 g/dL (ref 6.0–8.3)

## 2010-12-23 LAB — DIFFERENTIAL
Eosinophils Absolute: 0 10*3/uL (ref 0.0–0.7)
Eosinophils Relative: 0 % (ref 0–5)
Lymphocytes Relative: 7 % — ABNORMAL LOW (ref 12–46)
Lymphs Abs: 1 10*3/uL (ref 0.7–4.0)
Monocytes Relative: 7 % (ref 3–12)
Neutrophils Relative %: 86 % — ABNORMAL HIGH (ref 43–77)

## 2010-12-23 LAB — CBC
HCT: 39.7 % (ref 39.0–52.0)
MCH: 32.5 pg (ref 26.0–34.0)
MCV: 94.7 fL (ref 78.0–100.0)
Platelets: 181 10*3/uL (ref 150–400)
RBC: 4.19 MIL/uL — ABNORMAL LOW (ref 4.22–5.81)
WBC: 14.6 10*3/uL — ABNORMAL HIGH (ref 4.0–10.5)

## 2010-12-23 LAB — URINE MICROSCOPIC-ADD ON

## 2010-12-23 LAB — SAMPLE TO BLOOD BANK

## 2010-12-23 LAB — PROTIME-INR: INR: 3.72 — ABNORMAL HIGH (ref 0.00–1.49)

## 2010-12-23 NOTE — Progress Notes (Signed)
2 weeks of changes: unable to stand, episodic trouble speaking, occ disoriented.  Sleep is disrupted, occ gets agitated.  Per family, pt had reported seeing people who aren't in the room.  Symptoms will wx and wane.    H/o CVA on coumadin with longstanding R sided dec in sensation and motor deficit.  Dec in PO intake, dec in liquid intake recently.  Current Outpatient Prescriptions on File Prior to Visit  Medication Sig Dispense Refill  . amiodarone (PACERONE) 200 MG tablet Take 200 mg by mouth daily.        Marland Kitchen aspirin 81 MG tablet Take 81 mg by mouth daily.        . carbamazepine (TEGRETOL) 200 MG tablet Take 1 1/2 tabs by mouth twice daily  90 tablet  5  . cholecalciferol (VITAMIN D) 1000 UNITS tablet Take 1,000 Units by mouth daily.        . digoxin (LANOXIN) 0.125 MG tablet Take two tablets now and 2 again in 6 hours.  Take one daily thereafter.  34 tablet  11  . docusate sodium (COLACE) 100 MG capsule Take 200 mg by mouth daily.        Marland Kitchen escitalopram (LEXAPRO) 10 MG tablet Take 10 mg by mouth daily.        . fish oil-omega-3 fatty acids 1000 MG capsule Take 2 g by mouth daily.        . furosemide (LASIX) 20 MG tablet Take 20 mg by mouth daily.        Marland Kitchen HYDROcodone-acetaminophen (NORCO) 10-325 MG per tablet Take 1 tablet by mouth every 6 (six) hours as needed.        Marland Kitchen levothyroxine (LEVOTHROID) 25 MCG tablet Take 1 tablet (25 mcg total) by mouth daily.  30 tablet  5  . LORazepam (ATIVAN) 0.5 MG tablet Take 0.5 mg by mouth every 6 (six) hours as needed.        . metoprolol succinate (TOPROL XL) 25 MG 24 hr tablet Take 1 tablet (25 mg total) by mouth 2 (two) times daily.  60 tablet  1  . potassium chloride (KLOR-CON 10) 10 MEQ CR tablet Take 10 mEq by mouth daily.        . rosuvastatin (CRESTOR) 20 MG tablet Take 1 tablet (20 mg total) by mouth daily.  90 tablet  0  . sennosides-docusate sodium (SENOKOT-S) 8.6-50 MG tablet Take 2 tablets by mouth daily. And then up to 3 times a day as needed.        . sorbitol 70 % solution Take 2 tablespoonfuls every two hours as needed.       . warfarin (COUMADIN) 5 MG tablet Take 0.5 tablets (2.5 mg total) by mouth daily.  30 tablet  2   No Known Allergies  Past Medical History  Diagnosis Date  . Hyperlipidemia   . Hypertension   . Atrial fibrillation     Coumadin therapy - Dr. Jens Som.  Cardioversion 08/23/09, Amiodarone Rx since 09/03/2009  . CVA (cerebral vascular accident) 2007    In the setting of atrial fibrillation  . Hiatal hernia   . CAD (coronary artery disease) 09/2008    Dr. Jens Som, stent to LAD  . Seizures 12/2008    Dr. Sharene Skeans  . Pulmonary nodules 09/2008    Nonspecific reticulonodular nodules RLL superior segment, resolved on CT 06/14/09  . Alveolar/parietoalveolar pneumonopathy, other     06/14/09 CT chest, Mild LLL GGO; 02/28/10 CT chest , improved  . Dyspnea 09/2008  Class 3, since 2008.  Significant tachycardia.  No PE or fibrosis on CT chest 3/10, 12/10, 02/2010, normal PFT but isolated low DLCO  06/2009, normal hgb, TSH, creat and albumin , Dec 2010.  Resolved after A Fib cardioversion and Amiodarone Marh 2011  . Hepatitis   . History of chickenpox   . Nonspecific abnormal results of pulmonary system function study   . Agitation     and irritability, situational exacerbation    Past Surgical History  Procedure Date  . Appendectomy   . Stent placed 09/2008     In wheelchair, here with family. Speaks slowly, occ is understandable.  Often with 1 word sentences.  Some words are not intelligible.  Not oriented to place, date, unable to say name.   Mmm Huston Foley but regular No focal dec in BS, no inc in WOB 1+ edema in BLE Able to follow commands and move L arm, L leg R arm with contracture and leg with weakness noted.

## 2010-12-23 NOTE — Patient Instructions (Signed)
To ER

## 2010-12-23 NOTE — Patient Instructions (Signed)
Patient was sent to the hospital, not sure if he will still be under our care for INR monitoring. Will schedule out 1 month to check on patients progress.

## 2010-12-23 NOTE — Assessment & Plan Note (Signed)
EMS called and in route.  Nontoxic (ie no inc in wob and no neuro changes in last 24h) but this is clearly a change.  His speech is changed and he has been seeing dead relatives.  Still on coumadin.  I would send to ER for eval and likely admission, given the bradycardia.  Note that dig had been added recently.  App help of all involved.

## 2010-12-24 DIAGNOSIS — M7989 Other specified soft tissue disorders: Secondary | ICD-10-CM

## 2010-12-24 LAB — COMPREHENSIVE METABOLIC PANEL
ALT: 24 U/L (ref 0–53)
AST: 22 U/L (ref 0–37)
Calcium: 8.5 mg/dL (ref 8.4–10.5)
GFR calc Af Amer: >60 mL/min (ref >60)
Glucose, Bld: 109 mg/dL — ABNORMAL HIGH (ref 70–99)
Sodium: 138 mEq/L (ref 135–145)
Total Protein: 6.4 g/dL (ref 6.0–8.3)

## 2010-12-24 LAB — CBC
HCT: 34.8 % — ABNORMAL LOW (ref 39.0–52.0)
Hemoglobin: 12.2 g/dL — ABNORMAL LOW (ref 13.0–17.0)
MCH: 32.6 pg (ref 26.0–34.0)
MCHC: 35.1 g/dL (ref 30.0–36.0)
Platelets: UNDETERMINED 10*3/uL (ref 150–400)
RBC: 3.74 MIL/uL — ABNORMAL LOW (ref 4.22–5.81)
WBC: 11.8 10*3/uL — ABNORMAL HIGH (ref 4.0–10.5)

## 2010-12-24 LAB — DIFFERENTIAL
Basophils Relative: 0 % (ref 0–1)
Eosinophils Relative: 0 % (ref 0–5)
Lymphocytes Relative: 9 % — ABNORMAL LOW (ref 12–46)
Monocytes Relative: 10 % (ref 3–12)
Neutrophils Relative %: 81 % — ABNORMAL HIGH (ref 43–77)
Smear Review: UNDETERMINED

## 2010-12-24 LAB — PROTIME-INR: INR: 3.87 — ABNORMAL HIGH (ref 0.00–1.49)

## 2010-12-25 DIAGNOSIS — I4891 Unspecified atrial fibrillation: Secondary | ICD-10-CM

## 2010-12-25 DIAGNOSIS — I498 Other specified cardiac arrhythmias: Secondary | ICD-10-CM

## 2010-12-25 LAB — BASIC METABOLIC PANEL
BUN: 13 mg/dL (ref 6–23)
CO2: 26 mEq/L (ref 19–32)
Calcium: 8.3 mg/dL — ABNORMAL LOW (ref 8.4–10.5)
Creatinine, Ser: 1.16 mg/dL (ref 0.50–1.35)
Glucose, Bld: 86 mg/dL (ref 70–99)

## 2010-12-25 LAB — CBC
HCT: 34.1 % — ABNORMAL LOW (ref 39.0–52.0)
MCHC: 34.3 g/dL (ref 30.0–36.0)
RDW: 14.5 % (ref 11.5–15.5)

## 2010-12-25 LAB — PROTIME-INR: INR: 3.18 — ABNORMAL HIGH (ref 0.00–1.49)

## 2010-12-26 ENCOUNTER — Ambulatory Visit: Payer: Medicare Other

## 2010-12-26 LAB — BASIC METABOLIC PANEL
BUN: 10 mg/dL (ref 6–23)
Calcium: 8.9 mg/dL (ref 8.4–10.5)
GFR calc non Af Amer: >60 mL/min (ref >60)
Glucose, Bld: 113 mg/dL — ABNORMAL HIGH (ref 70–99)

## 2010-12-26 LAB — CBC
MCH: 32.6 pg (ref 26.0–34.0)
MCHC: 35.2 g/dL (ref 30.0–36.0)
MCV: 92.8 fL (ref 78.0–100.0)
Platelets: 163 10*3/uL (ref 150–400)
RDW: 14.4 % (ref 11.5–15.5)
WBC: 6.3 10*3/uL (ref 4.0–10.5)

## 2010-12-26 LAB — CARDIAC PANEL(CRET KIN+CKTOT+MB+TROPI)
CK, MB: 1.9 ng/mL (ref 0.3–4.0)
Total CK: 124 U/L (ref 7–232)
Troponin I: <0.3 ng/mL (ref <0.30)

## 2010-12-27 LAB — BASIC METABOLIC PANEL
BUN: 10 mg/dL (ref 6–23)
CO2: 26 mEq/L (ref 19–32)
Calcium: 8.5 mg/dL (ref 8.4–10.5)
Creatinine, Ser: 1.16 mg/dL (ref 0.50–1.35)
GFR calc Af Amer: >60 mL/min (ref >60)

## 2010-12-27 LAB — PROTIME-INR
INR: 1.73 — ABNORMAL HIGH (ref 0.00–1.49)
Prothrombin Time: 20.6 seconds — ABNORMAL HIGH (ref 11.6–15.2)

## 2010-12-28 LAB — PROTIME-INR: Prothrombin Time: 20 seconds — ABNORMAL HIGH (ref 11.6–15.2)

## 2010-12-28 NOTE — Consult Note (Addendum)
NAME:  Darrell Hayes, NICKOLSON NO.:  [id number]  MEDICAL RECORD NO.:  [id number]  LOCATION:  3713                         FACILITY:  [facility]  PHYSICIAN:  Hillis Range, MD       DATE OF BIRTH:  1940/09/23  DATE OF CONSULTATION:  12/25/2010 DATE OF DISCHARGE:                                CONSULTATION   PRIMARY CARDIOLOGIST:  Madolyn Frieze. Jens Som, MD, [facility]  CHIEF COMPLAINT:  Confusion, lethargy, agitation.  HISTORY OF PRESENT ILLNESS:  Darrell Hayes is a 70 year old gentleman with a history of CAD, prior CVA with facial weakness, atrial fibrillation, status post cardioversion, bradycardia who was admitted with confusion, progressive lethargy, and agitation for approximately 2 weeks, with agitation even here at the hospital.  He was subsequently diagnosed with Gram-negative rod UTI and has had preliminarily treated with Rocephin while culture is pending.  He has had some improvement in his daytime agitation per the sitter that is present.  Other history is obtained from the chart as family is not present currently.  The patient is a poor historian due to his aphasia.  We have asked to see him regarding bradycardia with medication regimen for his known history of atrial fibrillation.  On telemetry, he is maintaining sinus rhythm with heart rates in the mid 40s to low 50s without sustained pauses.  He has had no syncope.  He currently denies any pain or shortness of breath.  He unfortunately speaks in short sentences with some aphasia at his baseline is unclear at this time.  PAST MEDICAL HISTORY: 1. CAD, status post bare-metal stent to mid LAD in March 2010. 2. Normal LV function by echo, March 2010 with EF 55%-60% with mild     diastolic dysfunction, trivial AI. 3. 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 4. Atrial fibrillation, status post cardioversion in February 2011,     maintained on Coumadin. 5. Hypertension. 6. Hyperlipidemia. 7. Hiatal  hernia. 8. Bradycardia in 2011. 9. Pulmonary nodules, followed by Pulmonary. 10.Appendectomy.  OUTPATIENT MEDICATIONS: 1. Amiodarone 200 mg daily. 2. Coumadin. 3. Tegretol 300 mg t.i.d. 4. Lexapro 10 mg daily. 5. Fish oil 2000 mg daily. 6. Lorazepam 0.5 mg q.6 h. p.r.n. 7. Toprol-XL 25 mg b.i.d. 8. Digoxin 0.125 mg daily. 9. Docusate 200 mg daily. 10.Crestor 20 mg daily. 11.Lasix 40 mg daily. 12.Norco 10/325 mg daily. 13.Aspirin 81 mg daily. 14.Potassium chloride 10 mEq daily. 15.Levothyroxine 25 mcg daily. 16.Vitamin D OTC.  As an inpatient, his Lasix and potassium had been stopped secondary to renal insufficiency and Rocephin has been added.  ALLERGIES:  No known drug allergies.  SOCIAL HISTORY:  Darrell Hayes lives with his daughter and son-in-law.  He is a former tobacco pipe user.  He denies any alcohol use.  FAMILY HISTORY:  Positive for CVA in his father.  REVIEW OF SYSTEMS:  Unable to reliably obtain from the patient given his aphasia.  He does, however, at present time deny any chest pain, shortness of breath, palpitations, or syncope.  LABS:  WBC 7.1, hemoglobin 11.7, hematocrit 34.1, platelet count 155. Sodium 142, potassium 3.7, chloride 106, CO2 26, glucose 86, BUN 13, creatinine 1.16.  His creatinine  on admission was 1.46.  Digoxin level was 0.4.  UA had positive nitrites, blood, ketones, leukocytes, bacteria, and preliminary culture is positive for Gram-negative rods.  RADIOLOGIC STUDIES: 1. Shoulder plain films showed no acute findings. 2. CT of the head showed chronic left posterior cerebral artery     infarct, unchanged from prior.  PHYSICAL EXAMINATION:  VITAL SIGNS:  Temperature 98, pulse 45, respirations 16, blood pressure 146/73, pulse ox is 91% on room air. EKG shows sinus rhythm with heart rate of 53 beats per minute with no acute ST-T changes. GENERAL:  This is a pleasant, but at times unintelligible white male in no acute distress.  He is  calm and cooperative. HEENT:  Normocephalic and atraumatic with extraocular movements intact. Clear sclerae.  Nares are without discharge.  He has small pupils. NECK:  Supple without JVD. HEART:  Auscultation of the heart reveals regular rate and rhythm, somewhat slow, S1, S2 audible without murmurs, rubs, or gallops. LUNGS:  Clear to auscultation bilaterally without wheezes, rales, or rhonchi. ABDOMEN:  Soft, nontender, nondistended with positive bowel sounds. EXTREMITIES:  Warm, dry, and with trace bilateral ankle edema. NEUROLOGIC:  He is alert and oriented to his name only.  He is not able to get the date, situation, or place.  He is unable to repeat common phrases when I instructed to do so.  ASSESSMENT/PLAN:  The patient was seen and examined by Dr. Johney Frame and myself.  This is a 70 year old gentleman with a history of coronary artery disease, normal left ventricular function, cerebrovascular accident with history of facial weakness, atrial fibrillation, status post cardioversion who presented to the [facility] with confusion, progressive lethargy, and agitation for approximately 2 weeks.  He was subsequently diagnosed with urinary tract infection, for which he has been treated.  We have been asked to consult him regarding bradycardia, for which we feel that he is asymptomatic.  His heart rate has been maintained in the upper 40s to low 50s without sustained pauses, and he has had no syncope.  His atrial fibrillation is well controlled on amiodarone, and he is also on Coumadin for stroke prevention.  At this time, we  recommended to discontinue digoxin.  We would ideally like to continue metoprolol, whole day for heart rate less than 50 and his amiodarone should also be continued for antiarrhythmic therapy.  Coumadin should be continued.  No further cardiac workup is planned at present.  Thank you for the opportunity to participate in the care of  this patient.     Ronie Spies, P.A.C.   ______________________________ Hillis Range, MD    DD/MEDQ  D:  12/25/2010  T:  12/26/2010  Job:  [id number]  cc:   Madolyn Frieze. Jens Som, MD, [facility]  Electronically Signed by Hillis Range MD on 12/28/2010 04:56:31 PM Electronically Signed by Ronie Spies  on 01/08/2011 01:32:11 PM

## 2010-12-29 LAB — URINE CULTURE
Colony Count: >=100000
Culture  Setup Time: 201206191605

## 2010-12-29 LAB — CBC
MCV: 92.2 fL (ref 78.0–100.0)
Platelets: 228 10*3/uL (ref 150–400)
RBC: 4.1 MIL/uL — ABNORMAL LOW (ref 4.22–5.81)
WBC: 7.6 10*3/uL (ref 4.0–10.5)

## 2010-12-29 LAB — BASIC METABOLIC PANEL
GFR calc Af Amer: >60 mL/min (ref >60)
GFR calc non Af Amer: >60 mL/min (ref >60)
Potassium: 3.9 mEq/L (ref 3.5–5.1)
Sodium: 138 mEq/L (ref 135–145)

## 2010-12-29 LAB — PROTIME-INR: Prothrombin Time: 20.5 seconds — ABNORMAL HIGH (ref 11.6–15.2)

## 2010-12-30 LAB — PROTIME-INR
INR: 2.31 — ABNORMAL HIGH (ref 0.00–1.49)
Prothrombin Time: 25.8 seconds — ABNORMAL HIGH (ref 11.6–15.2)

## 2010-12-30 LAB — CARDIAC PANEL(CRET KIN+CKTOT+MB+TROPI): Total CK: 116 U/L (ref 7–232)

## 2010-12-31 LAB — CARDIAC PANEL(CRET KIN+CKTOT+MB+TROPI)
CK, MB: 2.4 ng/mL (ref 0.3–4.0)
Relative Index: INVALID (ref 0.0–2.5)
Total CK: 63 U/L (ref 7–232)

## 2010-12-31 LAB — PROTIME-INR: Prothrombin Time: 29.3 seconds — ABNORMAL HIGH (ref 11.6–15.2)

## 2011-01-04 ENCOUNTER — Emergency Department (HOSPITAL_COMMUNITY)
Admission: EM | Admit: 2011-01-04 | Discharge: 2011-01-04 | Disposition: A | Discharge status: Skilled Nursing Facility | Payer: Medicare Other | Attending: Emergency Medicine | Admitting: Emergency Medicine

## 2011-01-04 ENCOUNTER — Emergency Department (HOSPITAL_COMMUNITY): Payer: Medicare Other

## 2011-01-04 DIAGNOSIS — I10 Essential (primary) hypertension: Secondary | ICD-10-CM | POA: Insufficient documentation

## 2011-01-04 DIAGNOSIS — I2510 Atherosclerotic heart disease of native coronary artery without angina pectoris: Secondary | ICD-10-CM | POA: Insufficient documentation

## 2011-01-04 DIAGNOSIS — Z8673 Personal history of transient ischemic attack (TIA), and cerebral infarction without residual deficits: Secondary | ICD-10-CM | POA: Insufficient documentation

## 2011-01-04 DIAGNOSIS — I4891 Unspecified atrial fibrillation: Secondary | ICD-10-CM | POA: Insufficient documentation

## 2011-01-04 DIAGNOSIS — G40909 Epilepsy, unspecified, not intractable, without status epilepticus: Secondary | ICD-10-CM | POA: Insufficient documentation

## 2011-01-04 DIAGNOSIS — E7800 Pure hypercholesterolemia, unspecified: Secondary | ICD-10-CM | POA: Insufficient documentation

## 2011-01-04 DIAGNOSIS — F29 Unspecified psychosis not due to a substance or known physiological condition: Secondary | ICD-10-CM | POA: Insufficient documentation

## 2011-01-04 DIAGNOSIS — Y9210 Unspecified residential institution as the place of occurrence of the external cause: Secondary | ICD-10-CM | POA: Insufficient documentation

## 2011-01-04 DIAGNOSIS — S0990XA Unspecified injury of head, initial encounter: Secondary | ICD-10-CM | POA: Insufficient documentation

## 2011-01-04 DIAGNOSIS — Z8619 Personal history of other infectious and parasitic diseases: Secondary | ICD-10-CM | POA: Insufficient documentation

## 2011-01-04 DIAGNOSIS — R4701 Aphasia: Secondary | ICD-10-CM | POA: Insufficient documentation

## 2011-01-04 DIAGNOSIS — R296 Repeated falls: Secondary | ICD-10-CM | POA: Insufficient documentation

## 2011-01-05 ENCOUNTER — Encounter: Payer: Self-pay | Admitting: Family Medicine

## 2011-01-05 DIAGNOSIS — N179 Acute kidney failure, unspecified: Secondary | ICD-10-CM | POA: Insufficient documentation

## 2011-01-19 ENCOUNTER — Other Ambulatory Visit: Payer: Medicare Other

## 2011-01-21 NOTE — Discharge Summary (Signed)
NAMEMarland Kitchen  Darrell, Hayes NO.:  [id number]  MEDICAL RECORD NO.:  [id number]  LOCATION:  3713                         FACILITY:  [facility]  PHYSICIAN:  Mauro Kaufmann, MD         DATE OF BIRTH:  1940-07-24  DATE OF ADMISSION:  12/23/2010 DATE OF DISCHARGE:  12/31/2010                              DISCHARGE SUMMARY   ADMISSION DIAGNOSES: 1. Urinary tract infection. 2. Dehydration. 3. Atrial fibrillation. 4. History of stroke. 5. Coronary artery disease.  DISCHARGE DIAGNOSES: 1. Urinary tract infection. 2. Atrial fibrillation. 3. Hypertension. 4. Bradycardia. 5. Acute renal failure, resolved. 6. Altered mental status, resolved.  TESTS PERFORMED DURING THE HOSPITAL STAY: 1. CT head without contrast on December 23, 2010 showed chronic left     posterior cerebral artery infarct, unchanged.  No acute     abnormality. 2. X-ray of the shoulder on December 23, 2010 showed no acute findings.  CONSULTATIONS OBTAINED DURING THE HOSPITAL STAY:  Cardiology consultation.  PERTINENT LABORATORY DATA:  The patient had a urine culture which grew Klebsiella pneumoniae which is sensitive to ciprofloxacin.  As of December 30, 2010, the patient's labs, WBC 7.6, hemoglobin 13.7, hematocrit 37.8, and platelet count of 228.  His INR is 2.31.  Sodium 138, potassium 3.9, chloride 104, CO2 27, BUN 9, and creatinine 1.09.  Cardiac enzymes x3 were negative.  BRIEF HISTORY AND PHYSICAL:  This is a 70 year old male with a history of CAD, prior CVA with facial weakness, atrial fibrillation status post cardioversion, bradycardia who was admitted with confusion, progressive lethargy and agitation for approximately 2 weeks, and the patient also was having agitation even in the hospital.  The patient was admitted with a diagnosis of sepsis with UTI with altered mental status and started on IV antibiotics.  PHYSICAL EXAMINATION:  CHEST:  Clear to auscultation bilaterally. HEART:  S1-S2.  Regular rate and  rhythm. ABDOMEN:  Soft and nontender.  No organomegaly. EXTREMITIES:  No cyanosis, no clubbing, and no edema.  BRIEF HOSPITAL COURSE: 1. Altered mental status.  The patient had altered mental status due     to the UTI which improved once the patient was started on IV     antibiotics.  At this time, the patient's mental status is clear. 2. UTI.  The patient was initially started on IV Rocephin which was     later changed to p.o. ciprofloxacin as the urine culture grew     Klebsiella pneumoniae.  The patient will need 10 more days of p.o.     antibiotics at the nursing home. 3. Bradycardia.  The patient in the hospital was found to be     bradycardic, so cardiology consultation was obtained.  The patient     was seen by Dr. Hillis Range who discontinued the patient's digoxin     at this time and recommend the patient to continue on amiodarone     with metoprolol.  The patient's heart rate was in upper 40s to low     50s.  At this time, the patient's heart rate is around 70s-80s.  As     of today, the patient's temperature is 98.8, pulse 73,  respirations     19, blood pressure 102/69, and his O2 sats are 96% on room air. 4. Lower extremity edema.  The patient had lower extremity edema for     which bilateral venous Doppler was done which was negative.  At     this time, lower extremity swelling has totally resolved. 5. Acute renal failure.  The patient came in with acute renal failure     with a creatinine of 1.43 as the patient has been taking Lasix and     potassium at home.  The patient's Lasix was held due to the     hypotension as well as renal failure.  At this time, I am going to     continue to hold the Lasix as there is no clear indication that the     patient has fluid overload.  The patient's last echo was done in     March 2010 which showed an EF of 55-60% and there was only mild     diastolic dysfunction.  The patient may require Lasix later if he     develops fluid overload,  but at this time I do not think the     patient requires Lasix, as the patient is already hypotensive and     his blood pressure remaining in the low 100s.  DISCHARGE MEDICATIONS: 1. Cipro 500 mg p.o. b.i.d. for 10 days. 2. Amiodarone 200 mg p.o. daily. 3. Aspirin enteric coated 81 mg p.o. daily. 4. Carbamazepine 200 mg 1-1/2 tablets p.o. b.i.d. 5. Crestor 20 mg p.o. daily. 6. Docusate 100 mg 2 capsules by mouth daily. 7. Fish oil 1000 mg 2 capsules by mouth daily. 8. Levothyroxine 25 mcg p.o. daily. 9. Lexapro 10 mg p.o. daily. 10.Lorazepam 0.5 mg every 6 hours as needed. 11.Metoprolol XL 25 mg 1 tablet p.o. twice daily. 12.Norco 10/325 mg one tablet p.o. q.6 h. as needed. 13.Vitamin D 1000 mg over-the-counter 1 capsule daily. 14.Warfarin 5 mg p.o. daily.  The patient is on anticoagulation with warfarin for atrial fibrillation which will be continued.  At this time, the patient's INR is therapeutic.  DISCHARGE INSTRUCTIONS:  The patient is to follow up with his primary care physician in 2-3 weeks.     Mauro Kaufmann, MD     GL/MEDQ  D:  12/30/2010  T:  12/30/2010  Job:  [id number]  cc:   Madolyn Frieze. Jens Som, MD, [facility]  Electronically Signed by Mauro Kaufmann  on 01/21/2011 08:23:50 PM

## 2011-01-21 NOTE — Discharge Summary (Signed)
  NAME:  Darrell Hayes, Darrell Hayes NO.:  [id number]  MEDICAL RECORD NO.:  [id number]  LOCATION:  3713                         FACILITY:  [facility]  PHYSICIAN:  Mauro Kaufmann, MD         DATE OF BIRTH:  May 27, 1941  DATE OF ADMISSION:  12/23/2010 DATE OF DISCHARGE:                              DISCHARGE SUMMARY   ADDENDUM: Please make a note that the patient's ciprofloxacin is for 7 days.  He should take Cipro 500 p.o. b.i.d. for 7 days and then stop.  Please update the medication discharge list.     Mauro Kaufmann, MD     GL/MEDQ  D:  12/30/2010  T:  12/30/2010  Job:  [id number]  Electronically Signed by Sibyl Parr Aasir Daigler  on 01/21/2011 08:22:58 PM

## 2011-02-26 ENCOUNTER — Ambulatory Visit: Payer: Self-pay | Admitting: Family Medicine

## 2011-02-26 NOTE — Patient Instructions (Signed)
Patient is in a nursing facility, may come back here for INR once he has progressed to home.

## 2011-03-13 ENCOUNTER — Telehealth: Payer: Self-pay | Admitting: *Deleted

## 2011-03-13 NOTE — Telephone Encounter (Signed)
LMOVM for verbal order and to return the call if any questions.

## 2011-03-13 NOTE — Telephone Encounter (Signed)
Please give verbal order for skilled nursing, OT, PT and home health aide.  H/o CVA, CAD and AF.  Thanks.

## 2011-03-13 NOTE — Telephone Encounter (Signed)
Pt is being discharged from [facility] today and Darrell Hayes is asking for verbal order for skilled nursing, OT, PT and home health aide.

## 2011-03-17 ENCOUNTER — Telehealth: Payer: Self-pay | Admitting: Cardiology

## 2011-03-17 NOTE — Telephone Encounter (Signed)
Spoke with pt dtr, she is aware to decrease toprol to once daily. No number left for home health nurse Deliah Goody

## 2011-03-17 NOTE — Telephone Encounter (Signed)
SPOKE WITH HOME HEALTH NURSE WHILE VISITING TODAY  HR NOTED TO BE 44-46  AFTER EATING AND DRINKING NO CHANGE NOTED  OVER WEEKEND HEART RATE WS 56   CURRENTLY TAKING METOPROLOL 25 MG BID  HR THIS AM WAS CHECKED AFTER ONLY HAVING TAKEN MEDS 10-15 MIN PRIOR . PER DAUGHTER  IS  A BATTLE TO GET  DAD TO DRINK APPETITE IS GOOD .WILL DISCUSS WITH DR Jens Som./CY

## 2011-03-17 NOTE — Telephone Encounter (Signed)
Change toprol to 25 mg po daily Olga Millers

## 2011-03-17 NOTE — Telephone Encounter (Signed)
HH is at the house and pt having low heartrate   Please call them back.  [phone] is HH cell number.

## 2011-03-18 ENCOUNTER — Telehealth: Payer: Self-pay | Admitting: *Deleted

## 2011-03-18 NOTE — Telephone Encounter (Signed)
Without dysuria or fever, I wouldn't check u/a.  Verify his las INR value and date, verify his current coumadin dose and let me know. We can decide f/u at that point.

## 2011-03-18 NOTE — Telephone Encounter (Signed)
[name] is home now and daughter wants to know when he should get his PT/INR again.  She is also  requesting a U/A order because his urine is amber colored.  Misty says she explained that the discoloration was probably not surprising.  Please advise.

## 2011-03-18 NOTE — Telephone Encounter (Signed)
LMOVM of Larrelle's (daughter) and Misty's [facility]) phones asking them to return the call with the requested information.

## 2011-03-19 ENCOUNTER — Telehealth: Payer: Self-pay | Admitting: *Deleted

## 2011-03-19 NOTE — Telephone Encounter (Signed)
Darrell Hayes (daughter) says she thinks the last PT/INR was 03/02/11 (in computer) and the value was 2.7 (she thinks).  He is currently on 3.5 mg of Coumadin daily.   1. When does he need PT/INR again? 2. He was supposed to follow up with you in 2 weeks after discharge.  He has now been home a week.  You're not here next week and half days the next week.  Darrell Hayes is asking if it would be okay to wait and schedule the appt after you are back to full days, the first week in October?  She says she is fine with that and he is doing fine.

## 2011-03-19 NOTE — Telephone Encounter (Signed)
Daughter was advised about INR check  within a week.  She says HH will be doing that and asked me to send an order to Dorothy at [facility].  I will send that order, may have to ask another MD to order it since Dr. Para March is out of the office now.  Appt made for follow up on 04/14/2011.

## 2011-03-19 NOTE — Telephone Encounter (Signed)
Misty at [facility] needs an order to draw a PT/INR for this patient.  I was not aware that we needed this order until Dr. Para March had left the building.  Can you please sign this order so that I can fax it to [facility]?  (Order in your in box)

## 2011-03-19 NOTE — Telephone Encounter (Signed)
Signed by Dr. Hetty Ely and faxed.

## 2011-03-19 NOTE — Telephone Encounter (Signed)
I think the 8/23 was not done here, since he was in a facility.  I would get INR done again here (in as a lab visit) within 1 week and then schedule visit with me as soon as schedule allows.  Thanks.

## 2011-03-25 ENCOUNTER — Telehealth: Payer: Self-pay | Admitting: *Deleted

## 2011-03-25 NOTE — Telephone Encounter (Signed)
Darrell Hayes at [facility] called to request referral for social worker evaluation for safety concerns, care giver resources, long range planning.  Please advise.

## 2011-03-26 NOTE — Telephone Encounter (Signed)
Please give verbal order for social worker referrral. Chart review shows from what I can tell that that is very appropriate.

## 2011-03-27 NOTE — Telephone Encounter (Signed)
Left message on voice mail giving verbal ok for social worker eval.

## 2011-03-30 LAB — POCT I-STAT, CHEM 8
BUN: 15
Creatinine, Ser: 1.2
Glucose, Bld: 110 — ABNORMAL HIGH
Hemoglobin: 15
Potassium: 4.3

## 2011-03-30 LAB — URINE CULTURE

## 2011-03-30 LAB — URINALYSIS, ROUTINE W REFLEX MICROSCOPIC
Glucose, UA: NEGATIVE
Ketones, ur: NEGATIVE
Protein, ur: NEGATIVE

## 2011-03-30 LAB — URINE MICROSCOPIC-ADD ON

## 2011-04-02 ENCOUNTER — Telehealth: Payer: Self-pay

## 2011-04-02 NOTE — Telephone Encounter (Signed)
Larrell called and left v/m on Lugene's phone that home health had drawn PT/INR and was wondering if Dr Para March had gotten report to let pt know how to take coumadin. Left v/m for Larrell to call back.

## 2011-04-03 ENCOUNTER — Encounter: Payer: Self-pay | Admitting: Family Medicine

## 2011-04-03 NOTE — Telephone Encounter (Signed)
Dose changed in Epic.  Darrell Hayes notified via telephone as instructed.

## 2011-04-03 NOTE — Telephone Encounter (Signed)
Left message on cell phone voicemail for Darrell Hayes to return call.

## 2011-04-03 NOTE — Telephone Encounter (Signed)
I think the pace program is a good idea.  Please update the coumadin dose (3.5mg  a day) in EMR.  Thanks.

## 2011-04-03 NOTE — Telephone Encounter (Signed)
Spoke with Larrell.  Patient is taking 3.5mg  daily.  He has a f/u appt with Dr. Para March on 04/14/2011 and we can check PT/INR at that time. Larrell is trying to get patient into P.A.C.E program and would like Dr. Armanda Heritage opinion about that program.  She will not be with her father at the upcoming appt but her husband will bring him.

## 2011-04-10 LAB — COMPREHENSIVE METABOLIC PANEL
Alkaline Phosphatase: 70 U/L (ref 39–117)
BUN: 12 mg/dL (ref 6–23)
CO2: 24 mEq/L (ref 19–32)
Chloride: 106 mEq/L (ref 96–112)
Creatinine, Ser: 1.25 mg/dL (ref 0.4–1.5)
GFR calc non Af Amer: 58 mL/min — ABNORMAL LOW (ref >60)
Glucose, Bld: 130 mg/dL — ABNORMAL HIGH (ref 70–99)
Potassium: 4 mEq/L (ref 3.5–5.1)
Total Bilirubin: 0.4 mg/dL (ref 0.3–1.2)

## 2011-04-10 LAB — URINALYSIS, ROUTINE W REFLEX MICROSCOPIC
Ketones, ur: 15 mg/dL — AB
Nitrite: NEGATIVE
Protein, ur: NEGATIVE mg/dL
Urobilinogen, UA: 1 mg/dL (ref 0.0–1.0)

## 2011-04-10 LAB — CK TOTAL AND CKMB (NOT AT ARMC)
CK, MB: 6.3 ng/mL — ABNORMAL HIGH (ref 0.3–4.0)
Relative Index: 6.1 — ABNORMAL HIGH (ref 0.0–2.5)

## 2011-04-10 LAB — B-NATRIURETIC PEPTIDE (CONVERTED LAB): Pro B Natriuretic peptide (BNP): 90 pg/mL (ref 0.0–100.0)

## 2011-04-10 LAB — DIFFERENTIAL
Basophils Absolute: 0.1 10*3/uL (ref 0.0–0.1)
Basophils Relative: 0 % (ref 0–1)
Lymphocytes Relative: 12 % (ref 12–46)
Neutro Abs: 12.2 10*3/uL — ABNORMAL HIGH (ref 1.7–7.7)
Neutrophils Relative %: 80 % — ABNORMAL HIGH (ref 43–77)

## 2011-04-10 LAB — CBC
HCT: 44.5 % (ref 39.0–52.0)
Hemoglobin: 14.7 g/dL (ref 13.0–17.0)
MCV: 93.9 fL (ref 78.0–100.0)
Platelets: 281 10*3/uL (ref 150–400)
RBC: 4.74 MIL/uL (ref 4.22–5.81)
WBC: 15.2 10*3/uL — ABNORMAL HIGH (ref 4.0–10.5)

## 2011-04-10 LAB — CULTURE, BLOOD (ROUTINE X 2): Culture: NO GROWTH

## 2011-04-10 LAB — APTT: aPTT: 55 seconds — ABNORMAL HIGH (ref 24–37)

## 2011-04-10 LAB — PROTIME-INR: INR: 3.3 — ABNORMAL HIGH (ref 0.00–1.49)

## 2011-04-10 LAB — TROPONIN I: Troponin I: 0.03 ng/mL (ref 0.00–0.06)

## 2011-04-14 ENCOUNTER — Ambulatory Visit (INDEPENDENT_AMBULATORY_CARE_PROVIDER_SITE_OTHER): Payer: Medicare Other | Admitting: Family Medicine

## 2011-04-14 ENCOUNTER — Encounter: Payer: Self-pay | Admitting: Family Medicine

## 2011-04-14 VITALS — BP 108/60 | HR 54 | Temp 98.5°F

## 2011-04-14 DIAGNOSIS — Z5181 Encounter for therapeutic drug level monitoring: Secondary | ICD-10-CM

## 2011-04-14 DIAGNOSIS — Z87448 Personal history of other diseases of urinary system: Secondary | ICD-10-CM

## 2011-04-14 DIAGNOSIS — Z7901 Long term (current) use of anticoagulants: Secondary | ICD-10-CM

## 2011-04-14 DIAGNOSIS — Z23 Encounter for immunization: Secondary | ICD-10-CM

## 2011-04-14 DIAGNOSIS — L89309 Pressure ulcer of unspecified buttock, unspecified stage: Secondary | ICD-10-CM

## 2011-04-14 DIAGNOSIS — I4891 Unspecified atrial fibrillation: Secondary | ICD-10-CM

## 2011-04-14 DIAGNOSIS — L8990 Pressure ulcer of unspecified site, unspecified stage: Secondary | ICD-10-CM

## 2011-04-14 DIAGNOSIS — I6350 Cerebral infarction due to unspecified occlusion or stenosis of unspecified cerebral artery: Secondary | ICD-10-CM

## 2011-04-14 LAB — POCT INR: INR: 2.9

## 2011-04-14 MED ORDER — QUETIAPINE FUMARATE 25 MG PO TABS: 25.0000 mg | ORAL_TABLET | Freq: Two times a day (BID) | ORAL | Status: DC

## 2011-04-14 MED ORDER — METOPROLOL TARTRATE 25 MG PO TABS: 12.5000 mg | ORAL_TABLET | Freq: Every day | ORAL | Status: DC

## 2011-04-14 MED ORDER — LORAZEPAM 0.5 MG PO TABS: ORAL_TABLET | ORAL | Status: DC

## 2011-04-14 MED ORDER — ROSUVASTATIN CALCIUM 20 MG PO TABS: 20.0000 mg | ORAL_TABLET | Freq: Every day | ORAL | Status: AC

## 2011-04-14 MED ORDER — AMIODARONE HCL 200 MG PO TABS: 200.0000 mg | ORAL_TABLET | Freq: Every day | ORAL | Status: DC

## 2011-04-14 MED ORDER — SERTRALINE HCL 25 MG PO TABS: 25.0000 mg | ORAL_TABLET | Freq: Every day | ORAL | Status: DC

## 2011-04-14 MED ORDER — CARBAMAZEPINE 200 MG PO TABS: ORAL_TABLET | ORAL | Status: DC

## 2011-04-14 MED ORDER — WARFARIN SODIUM 1 MG PO TABS: ORAL_TABLET | ORAL | Status: DC

## 2011-04-14 MED ORDER — LEVOTHYROXINE SODIUM 25 MCG PO TABS: 25.0000 ug | ORAL_TABLET | Freq: Every day | ORAL | Status: DC

## 2011-04-14 NOTE — Patient Instructions (Addendum)
Don't change your regular meds for now, except for the decrease in the metoprol.   You can use OTC zinc barrier cream instead of the dermacloud.  Let me know if the spots aren't getting better.   Take care and let me know about the PACE program.

## 2011-04-14 NOTE — Patient Instructions (Signed)
Continue current dose, check in 4 weeks  

## 2011-04-15 ENCOUNTER — Encounter: Payer: Self-pay | Admitting: Family Medicine

## 2011-04-15 NOTE — Progress Notes (Signed)
H/o UTI, admitted, then to rehab and now back home.  No fevers.  No dysuria.  Discharge papers reviewed.  Memory changes, h/o CVA.  Family has noted changes in memory from pre-UTI/hospital stay.  We talked about options today.   Bradycardia, on beta blocker.  No dizzy.  No chest pain.  BB was prev decreased.  Pressure ulcers.  Has appropriate hard ware at home.  Family has been using dermacloud and trying to keep him clean.  He isn't able to stand for any duration, so pressure/sitting have been/will continue to be issue for him.    OT had eval'd pt and rec'd that pt have supervision.  This has been accomplished and I was reassured by family today. A family member was laid off recently and will be able to fill hours at home.  They are working on [employer]  I have assurances that pt is not to be left alone.  I had detailed conversation with family today and stressed supervision for his safety.    Meds, vitals, and allergies reviewed.   ROS: See HPI.  Otherwise, noncontributory.  Nad, pleasant in conversation, speech at baseline, intermittently with expressive changes noted Mmm rrr but slow ctab abd soft, not ttp Buttocks with shallow lesions, 1 on each buttock, each ~1cm, clean based w/o discharge Ext with trace edema.  Old contractures on R hand noted.  0/3 on recall, oriented to self.

## 2011-04-15 NOTE — Assessment & Plan Note (Signed)
Continue current meds.  We'll follow memory changes, but I wouldn't change meds now other than dec the BB.  I d/w family and they agrees.  They have provided reassurance that the patient will have supervision at home.

## 2011-04-15 NOTE — Assessment & Plan Note (Signed)
Change barrier cream and continue to off load as much as possible.

## 2011-04-15 NOTE — Assessment & Plan Note (Signed)
Treated

## 2011-04-15 NOTE — Assessment & Plan Note (Signed)
With bradycardia, dec the BB and follow.

## 2011-04-16 LAB — COMPREHENSIVE METABOLIC PANEL
ALT: 26
Albumin: 3.5
Alkaline Phosphatase: 81
BUN: 16
Chloride: 105
Glucose, Bld: 102 — ABNORMAL HIGH
Potassium: 4
Total Bilirubin: 0.7

## 2011-04-16 LAB — BASIC METABOLIC PANEL
BUN: 14
Calcium: 9
Creatinine, Ser: 1.14
GFR calc non Af Amer: >60
Glucose, Bld: 100 — ABNORMAL HIGH
Sodium: 141

## 2011-04-16 LAB — CARDIAC PANEL(CRET KIN+CKTOT+MB+TROPI): Total CK: 46

## 2011-04-16 LAB — DIFFERENTIAL
Basophils Absolute: 0
Basophils Relative: 0
Eosinophils Absolute: 0.1
Monocytes Absolute: 0.8 — ABNORMAL HIGH
Neutro Abs: 3.6
Neutrophils Relative %: 54

## 2011-04-16 LAB — POCT CARDIAC MARKERS
CKMB, poc: <1 — ABNORMAL LOW
Myoglobin, poc: 82.7
Operator id: 270111
Operator id: 279831

## 2011-04-16 LAB — CBC
HCT: 36.5 — ABNORMAL LOW
HCT: 39.9
Hemoglobin: 12.2 — ABNORMAL LOW
Hemoglobin: 13.6
MCHC: 33.5
MCV: 94.7
Platelets: 247
RBC: 3.85 — ABNORMAL LOW
RDW: 14.9 — ABNORMAL HIGH
RDW: 15 — ABNORMAL HIGH
WBC: 6.7
WBC: 6.9

## 2011-04-16 LAB — LIPID PANEL
HDL: 37 — ABNORMAL LOW
LDL Cholesterol: 83
Triglycerides: 56
VLDL: 11

## 2011-04-16 LAB — PROTIME-INR: Prothrombin Time: 17.3 — ABNORMAL HIGH

## 2011-04-17 LAB — CBC
HCT: 40.2
Hemoglobin: 13.8
Platelets: 259
RDW: 14.7 — ABNORMAL HIGH
WBC: 6.8

## 2011-04-17 LAB — COMPREHENSIVE METABOLIC PANEL
ALT: 14
Albumin: 3.7
Alkaline Phosphatase: 79
BUN: 12
Chloride: 104
Potassium: 4
Sodium: 138
Total Bilirubin: 0.6
Total Protein: 6.6

## 2011-04-17 LAB — DIFFERENTIAL
Basophils Absolute: 0
Basophils Relative: 0
Eosinophils Absolute: 0.1
Eosinophils Relative: 2
Monocytes Absolute: 0.6
Monocytes Relative: 9

## 2011-05-06 NOTE — H&P (Signed)
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  Increasing weakness and difficulty with ambulation.  The patient lives with his daughter and son-in-law.  He stays at home by himself during the day and remained sitting in his recliner most of the time.  Over the last few weeks, his daughter has noticed that he is increasingly confused and weak.  He is speaking in phrases that no one else understands, which is new for him.  The family denies noting any fevers or change in p.o. intake.  Upon arrival in the emergency room, he is found to be urinary tract infection and was started on ceftriaxone.  Daughter also complains of left shoulder pain that he has been complaining about.  Thirdly, she complains that he is having some increasing swelling in the right foot.  He keeps it elevated most of the day, but this has not improved situation.  He has been evaluated with primary care physician for this, but no change has been made.  He has not had an ultrasound of his lower extremity.  PAST MEDICAL HISTORY: 1. Atrial fibrillation. 2. Hypertension. 3. Hyperlipidemia. 4. Coronary artery disease. 5. History of stroke with resulting aphasia and weakness.  PAST SURGICAL HISTORY:  None.  MEDICATIONS: 1. Amiodarone. 2. Tegretol. 3. Metoprolol. 4. Digoxin. 5. Crestor. 6. Coumadin 5 mg. 7. Aspirin 81 mg (daughter does not know the dose of any of the     medications and is going to be obtaining now for  [place]).  ALLERGIES:  No known drug allergies.  FAMILY HISTORY:  Noncontributory.  SOCIAL HISTORY:  He used to smoke and drink, but has not done so in over 4 years.  He lives at home with his daughter and son-in-law.  REVIEW OF SYSTEMS:  All systems reviewed and were negative except, which was mentioned in the HPI.  PHYSICAL EXAMINATION:  VITAL SIGNS:  Temperature 98.2, heart rate 55, respirations 20, blood pressure 99/80, and 100% oxygen saturation on 2 L (94% on room air). GENERAL:  This is a pleasantly confused elderly white male in no acute distress, responds to voice when questioned in unintelligible speech. He is moving up and down in his bed, appears on sitting backwards and forwards. HEENT:  Dry mucous membranes.  Normocephalic, atraumatic. CARDIOVASCULAR:  Regular rate and rhythm.  No murmurs, rubs, or gallops. LUNGS:  Clear to auscultation bilaterally. ABDOMEN:  Soft, nontender, nondistended.  Positive bowel sounds. EXTREMITIES:  2+ nonpitting edema in the right foot.  Otherwise no edema, cyanosis, or clubbing.LABORATORY DATA:  White count 14.6, hemoglobin 13.6, hematocrit 39.7, platelets 181, and neutrophils 86%.  PT 37.4, INR 3.72.  Sodium 137, potassium 4.5, chloride 99, CO2 of 31, glucose 109, BUN 20, and creatinine 1.46.  Total bilirubin 0.2, alk phos 98, AST 33, ALT 34, total protein 7.1, albumin 3.4, and calcium 9.2.  Digoxin 0.4.  UA specific gravity 1.023, pH 5, ketones 15, nitrite positive, leukocytes large, squamous few, bacteria many (creatinine in March 2012 was 1.2).  RADIOLOGY DATA:  CT of the head shows chronic left posterior cerebral artery infarct.  It is unchanged.  No acute abnormality.  ASSESSMENT/PLAN:  A 70 year old male with multiple medical problems who presents with increasing lethargy and confusion, found to have a urinary tract infection on evaluation. 1. Urinary tract infection.  We will treat with ceftriaxone.  Follow     up cultures and  tailor to p.o. antibiotics as indicated.  We will     hydrate with normal saline. 2. Dehydration.  Give normal saline to improve volume status. 3. Atrial fibrillation.  Continue home Coumadin dosing with a goal INR     of 2-3.  Continue digoxin, amiodarone, and metoprolol.  Awaiting     dosages of these from his daughter so that we can initiate that     therapy. 4. History of stroke.  Continue aspirin and Coumadin, as he was     previously on these. 5. Coronary artery disease.  Continue home medications. 6. DVT prophylaxis.  The patient is not currently on Coumadin. 7. Left shoulder pain.  We will get x-ray of the shoulder to evaluate     for musculoskeletal disease. 8. Right foot swelling.  We will get an ultrasound of the right lower     extremity to rule out deep vein thrombosis.  Keep the foot elevated     at all times if possible. 9. Disposition.  I had discussion with the family regarding code     status.  He has previously been DNR/DNI, but the daughter is     hesitant about continuing that status during hospitalization.  She     is going to have a discussion with her brother and get back with     [place].  Until that time, he will be full code.          ______________________________ Letha Cape, MD    RW/MEDQ  D:  12/23/2010  T:  12/23/2010  Job:  [id number]  Electronically Signed by Virginia Rochester M.D. on 05/06/2011 10:19:47 AM

## 2011-05-07 ENCOUNTER — Telehealth: Payer: Self-pay

## 2011-05-07 NOTE — Telephone Encounter (Signed)
I did not rec that pt be left alone.  At last OV, I was told by family that pt wouldn't be left alone and that he would be supervised.  I was told that a family member's job situation changed and that allowed them to be available at home to help out.  Marland Kitchen

## 2011-05-07 NOTE — Telephone Encounter (Signed)
Max Fickle with protective services wants Dr Lianne Bushy opinion about pt being left alone. Pts daughter told Inetta Fermo that Dr Para March had told her pt could be alone for a couple of hours. Inetta Fermo can be reached at [phone].

## 2011-05-07 NOTE — Telephone Encounter (Signed)
Left detailed message on [facility]inas VM  

## 2011-05-12 ENCOUNTER — Ambulatory Visit: Payer: Medicare Other

## 2011-06-11 ENCOUNTER — Telehealth: Payer: Self-pay | Admitting: Cardiology

## 2011-06-11 NOTE — Telephone Encounter (Signed)
LOV,Echo,12 lead faxed to [facility] @ [phone] 06/11/11/km

## 2011-06-16 ENCOUNTER — Telehealth: Payer: Self-pay | Admitting: Radiology

## 2011-06-16 NOTE — Telephone Encounter (Signed)
FYI patient overdue 1 month for INR, left message on home number for family member to call and schedule.

## 2011-06-16 NOTE — Telephone Encounter (Signed)
Noted, let me know if he doesn't schedule.

## 2011-07-02 ENCOUNTER — Ambulatory Visit: Payer: Self-pay | Admitting: Family Medicine

## 2011-07-02 NOTE — Telephone Encounter (Signed)
Patient (per daughter, pt had a pacemaker inserted- will have protime checked in Cardiology)

## 2011-09-25 IMAGING — CR DG WRIST COMPLETE 3+V*R*
1 series · 6 of 6 positions shown · non-contrast
Comparison: none

REASON FOR EXAM: pain/injury
COMMENTS:

PROCEDURE:     DXR - DXR WRIST RT COMP WITH OBLIQUES  - May 02, 2009  [DATE]
RESULT:     Images of the right wrist demonstrate osteopenia and severe
degenerative changes. No acute fracture is evident. There is no evidence of
dislocation or foreign body.

[Series 1: view not recorded · 0.17mm/px · 6 of 6 slices shown]
[im 1/6]
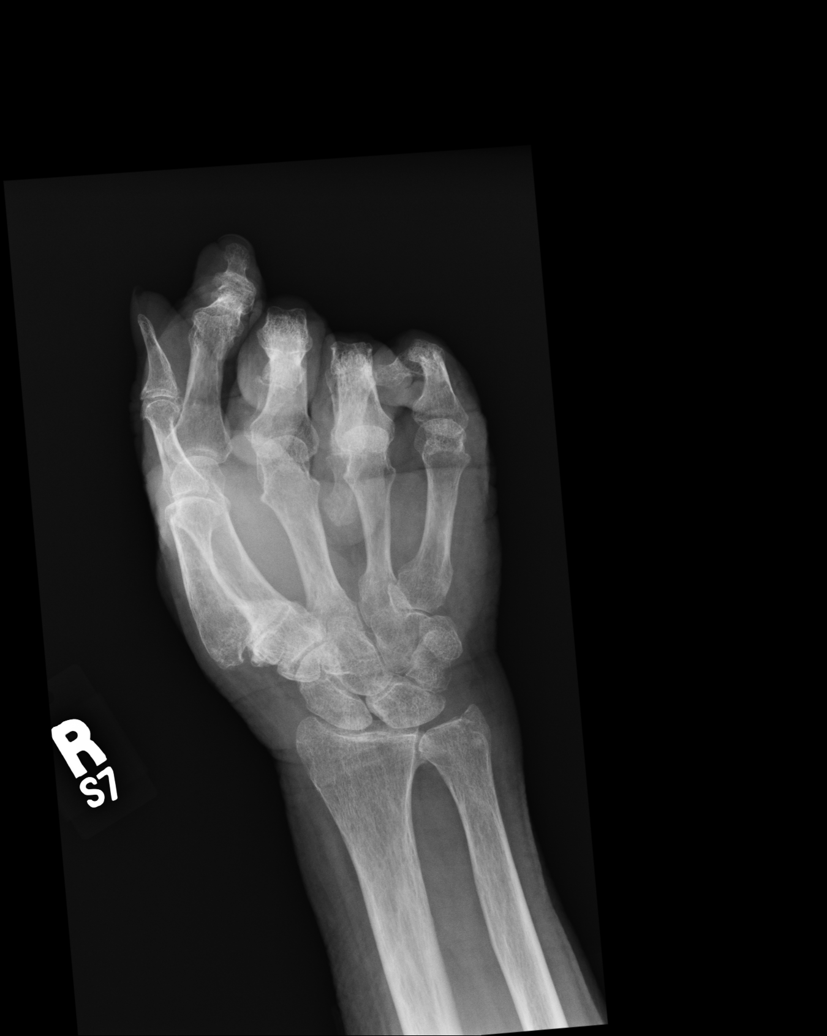
[im 2/6]
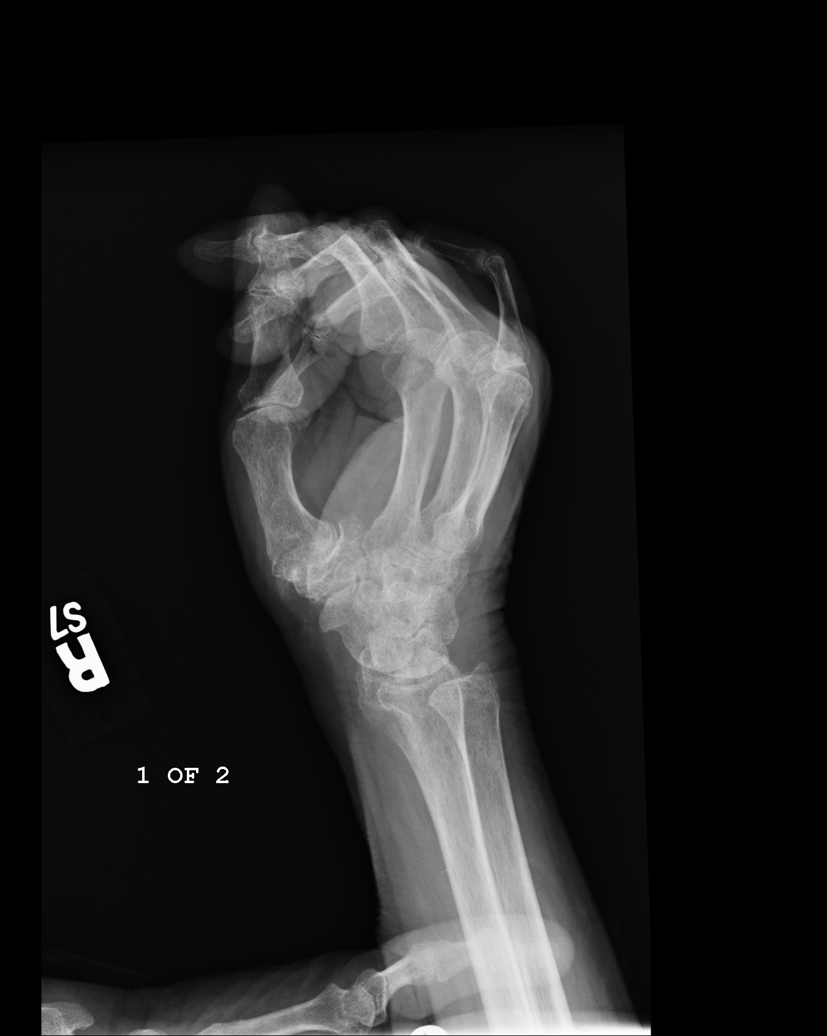
[im 3/6]
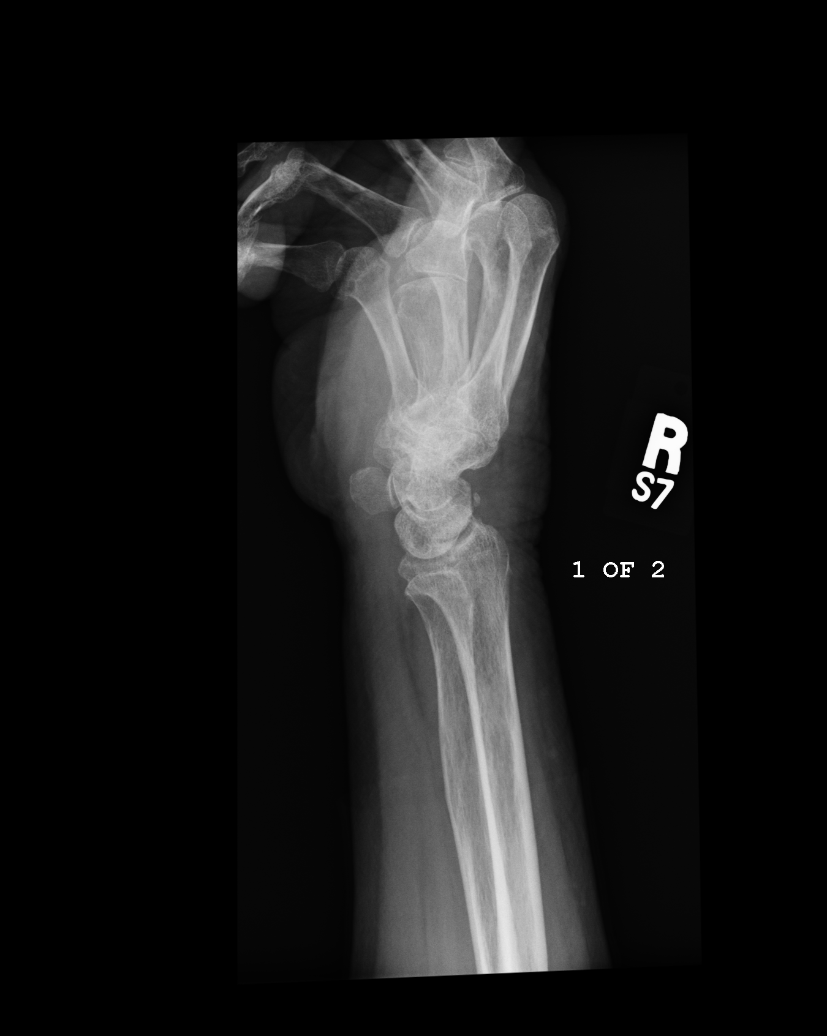
[im 4/6]
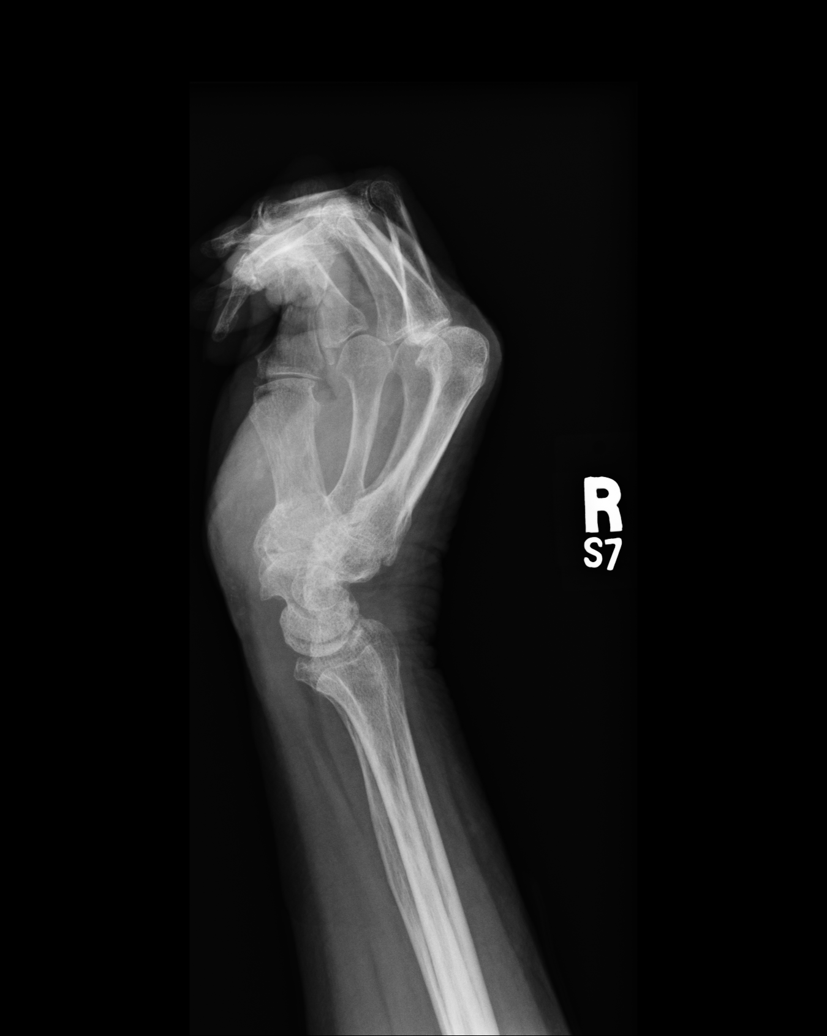
[im 5/6]
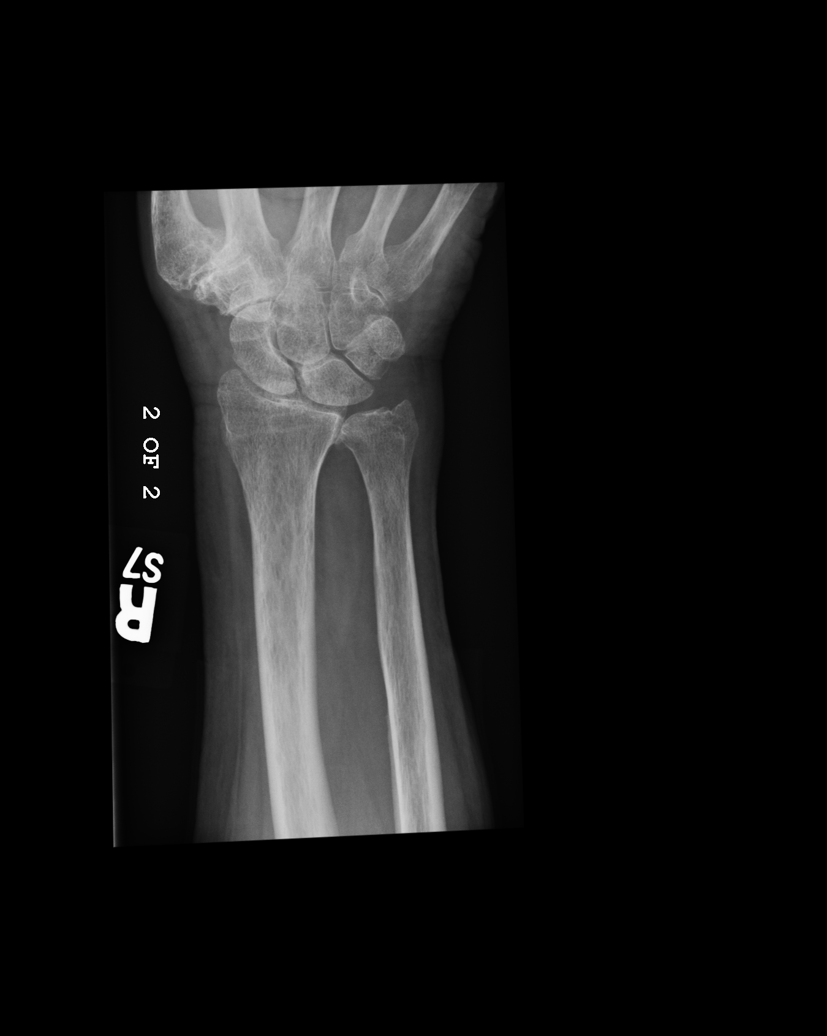
[im 6/6]
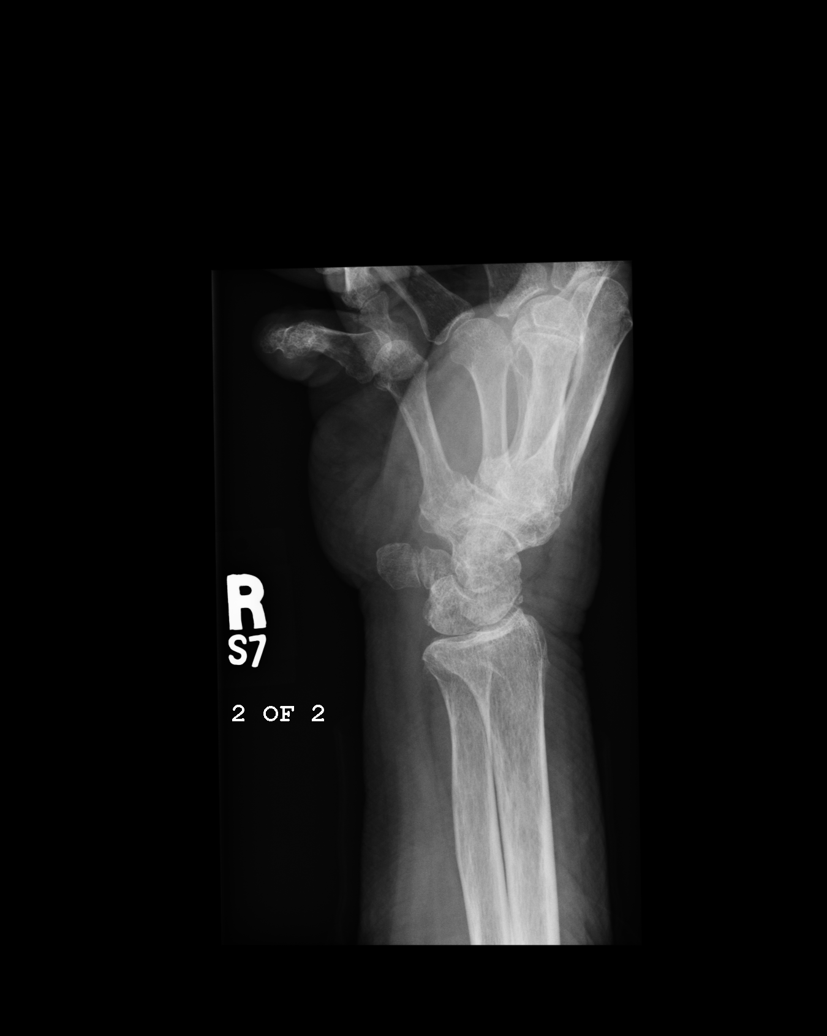

[6 of 6 positions shown; findings below may reference images not displayed]

IMPRESSION: Osteopenia and severe degenerative changes.

## 2012-02-29 ENCOUNTER — Telehealth: Payer: Self-pay | Admitting: *Deleted

## 2012-02-29 NOTE — Telephone Encounter (Signed)
Received call from dr Chana Bode, she is currently seeing this pt, the pt is in PAF and we have not been able to maintain sinus on amiodarone. The family is asking about stopping some of his meds. They wanted her to call and find out if okay to stop the amiodarone. He cont on metoprolol for rate control. She would like dr Jens Som to call with his option. Will forward for dr Jens Som review.

## 2012-02-29 NOTE — Telephone Encounter (Signed)
Left message for dr Damien Fusi of dr Ludwig Clarks recommendations

## 2012-02-29 NOTE — Telephone Encounter (Signed)
Hr may increase off amiodarone; would need to see first Darrell Hayes

## 2012-12-23 IMAGING — CR DG WRIST COMPLETE 3+V*R*
1 series · 4 of 4 positions shown · non-contrast
Comparison: none

REASON FOR EXAM: pain and swelling s/p fall
COMMENTS:

[Series 1: view not recorded · 0.17mm/px · 4 of 4 slices shown]
[im 1/4]
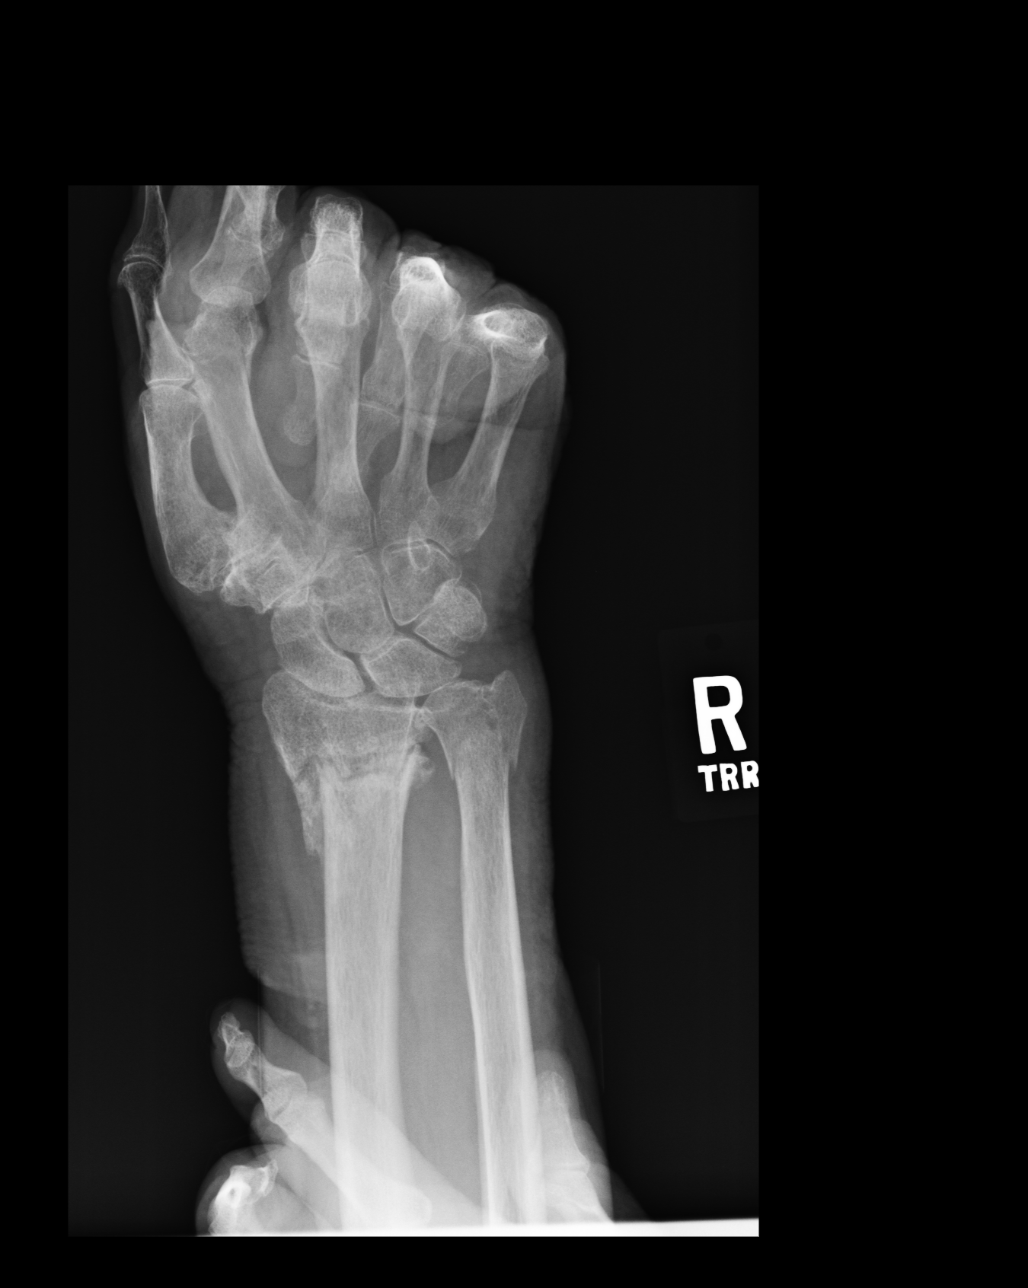
[im 2/4]
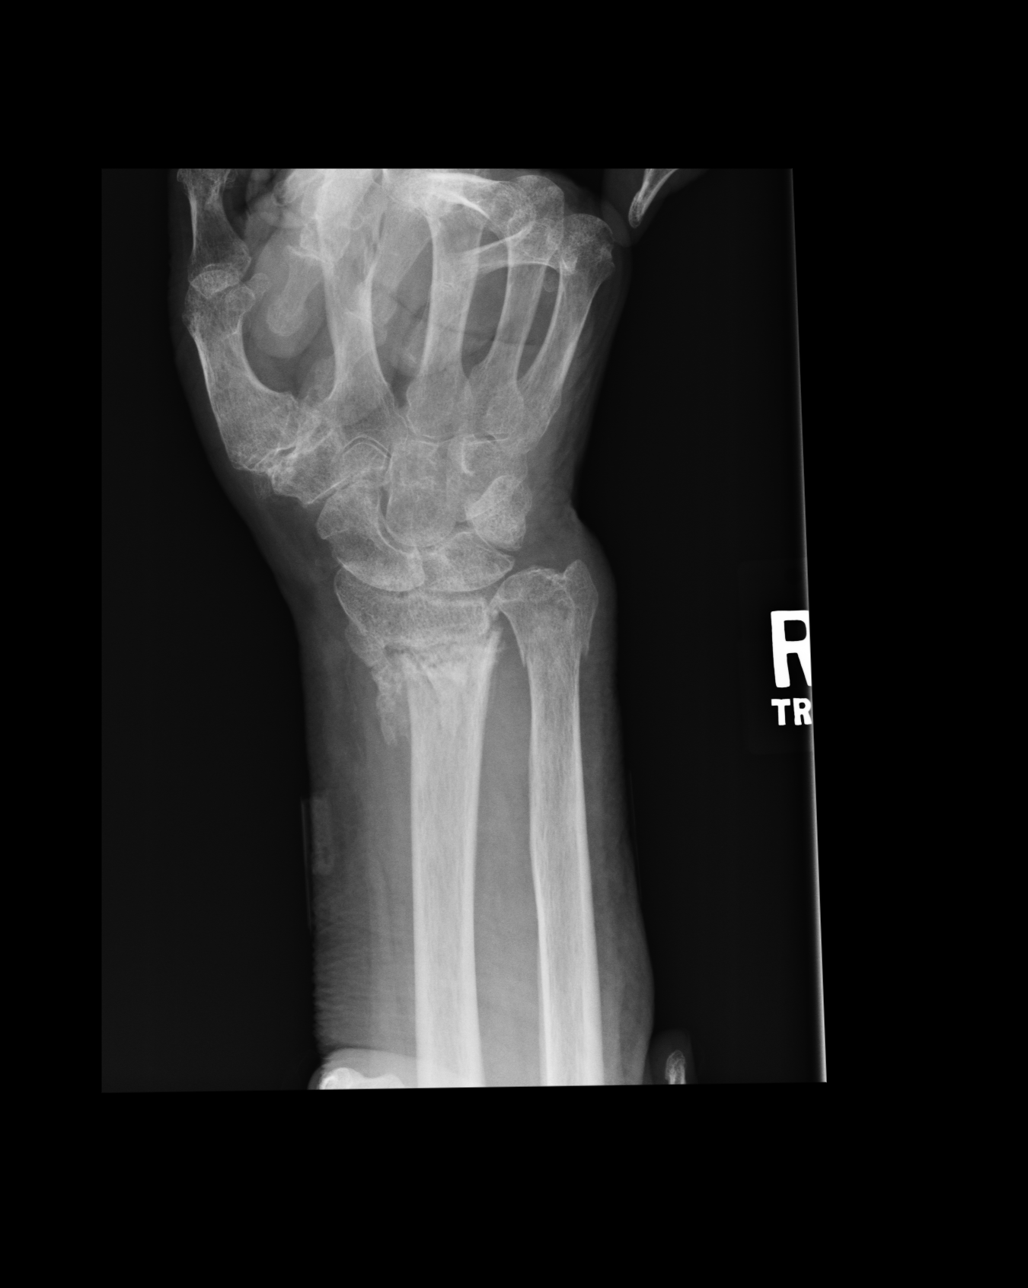
[im 3/4]
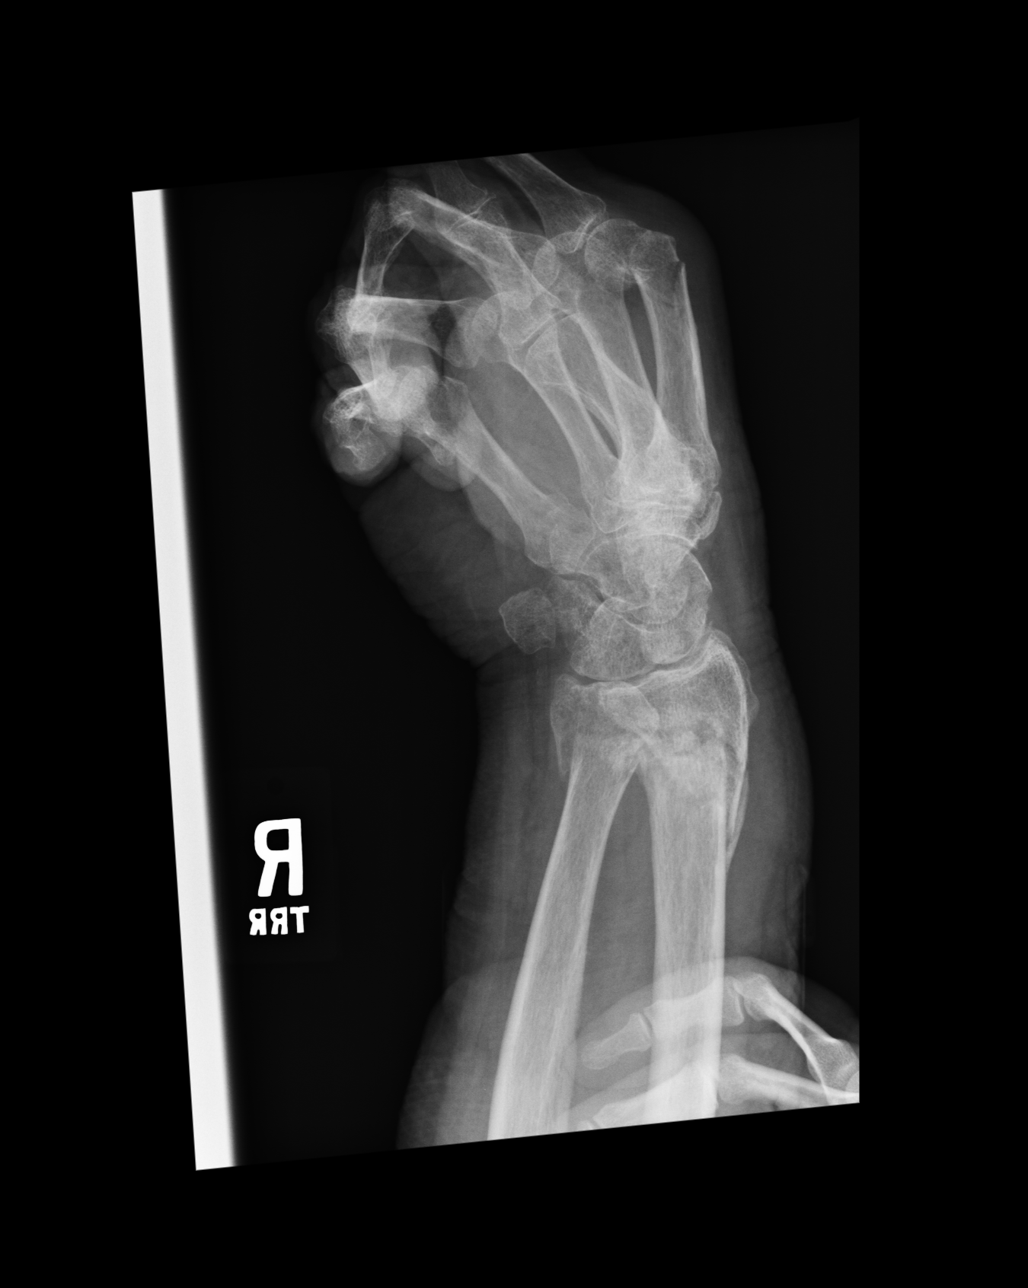
[im 4/4]
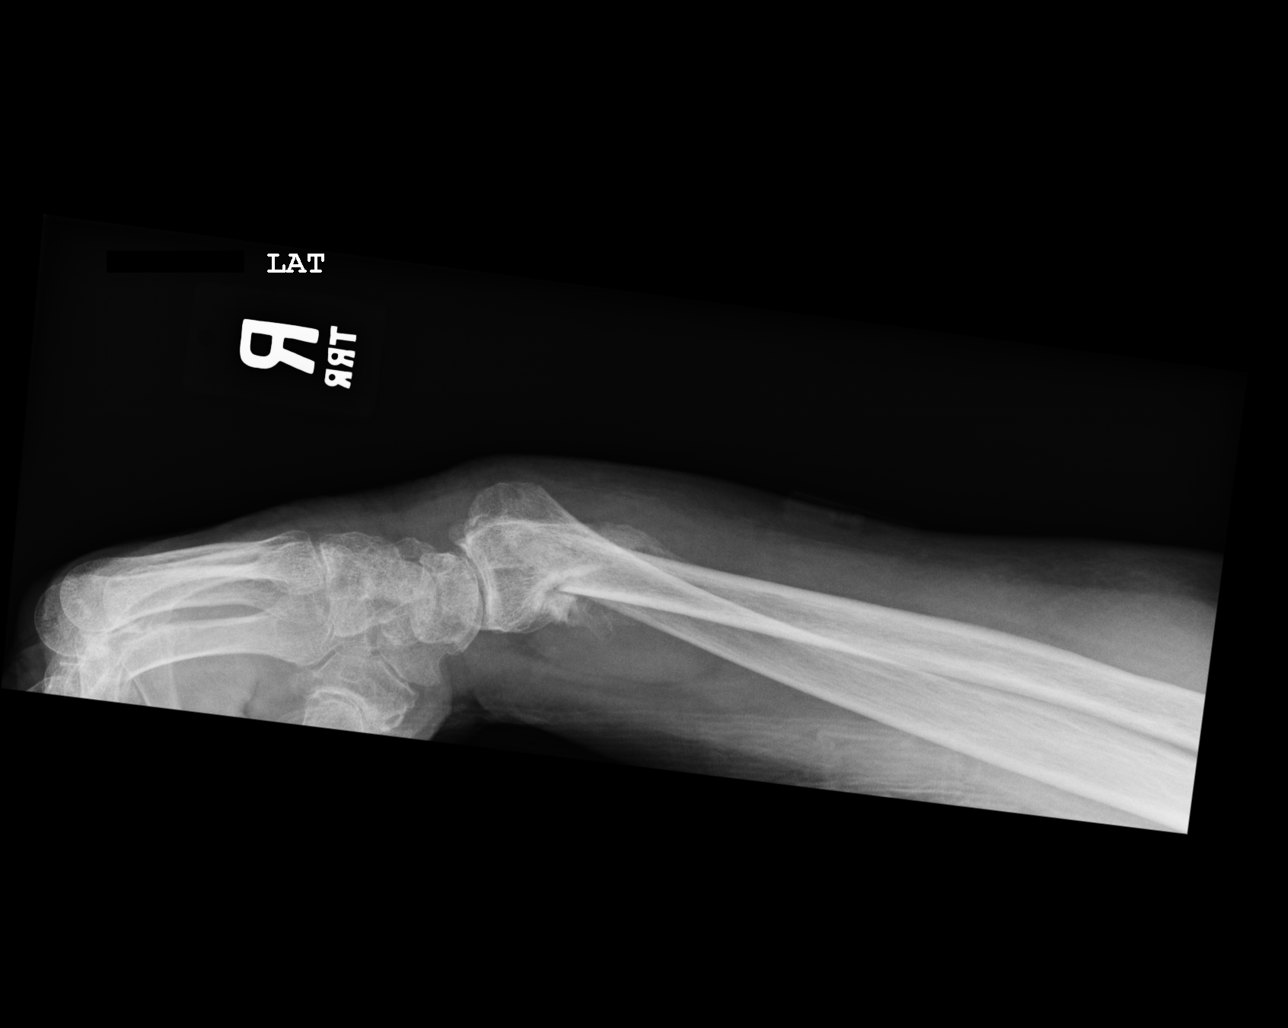

[4 of 4 positions shown; findings below may reference images not displayed]

PROCEDURE:     DXR - DXR WRIST RT COMP WITH OBLIQUES  - July 31, 2010  [DATE]

RESULT:     Comparison is made to a prior study dated 05/02/2009.

The bones are osteopenic.

A comminuted, impacted fracture is identified involving the distal ulna and
radius. The fractures demonstrate volar angulation and displacement. There
is intra-articular extension into the distal radioulnar joint.
IMPRESSION: Comminuted, impacted distal radius and ulnar fractures as
described above.

## 2012-12-23 IMAGING — CT CT HEAD WITHOUT CONTRAST
2 series · 15 of 30 positions shown, 19 images · non-contrast
Comparison: none

REASON FOR EXAM: Fall with hematoma on coumadin
COMMENTS:

PROCEDURE:     CT  - CT HEAD WITHOUT CONTRAST  - July 31, 2010  [DATE]
RESULT:     Head CT dated 07/31/2010.
TECHNIQUE: Helical noncontrasted 5 mm sections were obtained from the skull
base through the vertex.

[Series 2: without · axial · non-contrast · 0.46mm/px · z∈[-64,+66]mm · 13 of 32 slices shown, 17 images]
[im 3/32  brain]
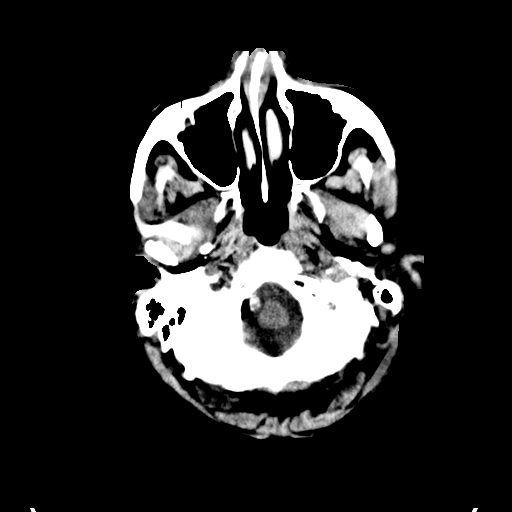
[im 3/32  bone]
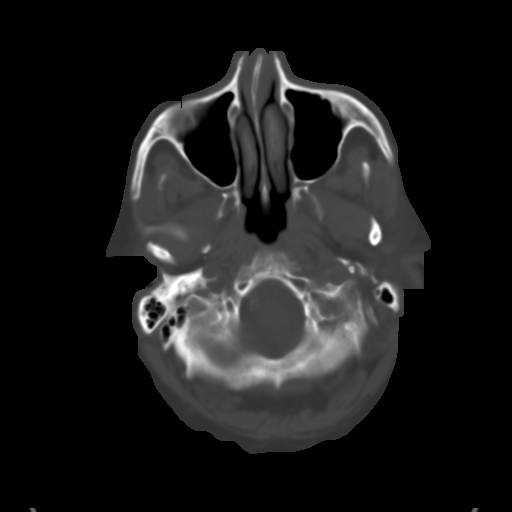
[im 5/32  brain]
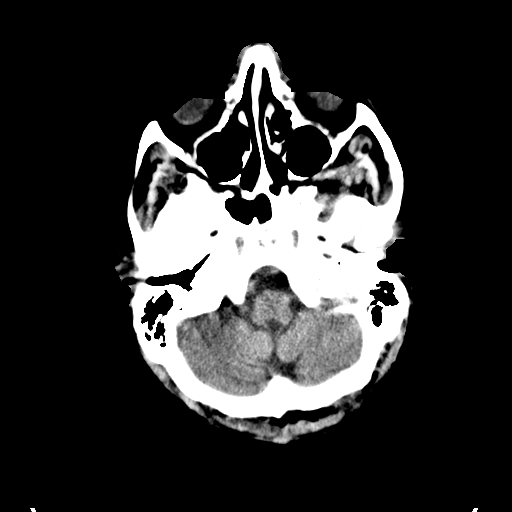
[im 7/32  brain]
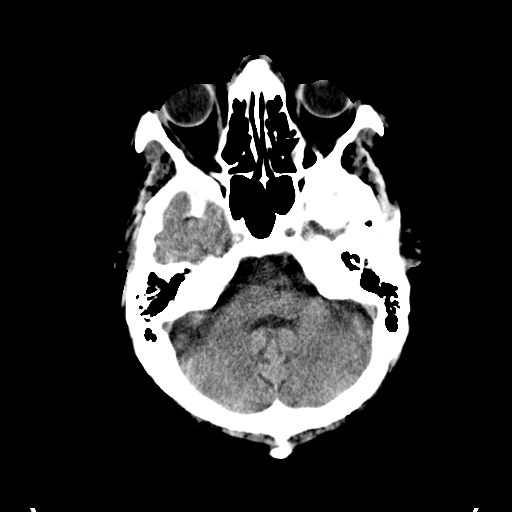
[im 9/32  brain]
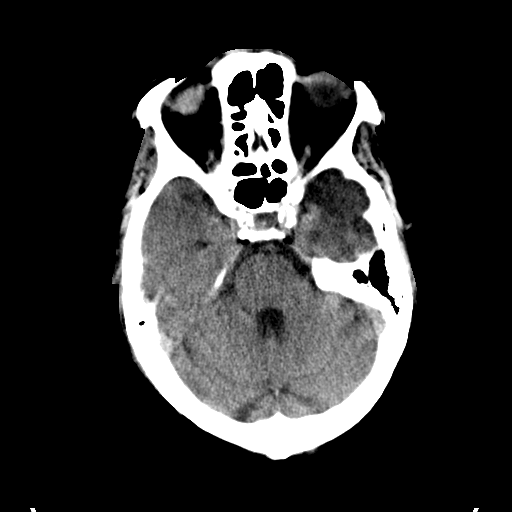
[im 12/32  brain]
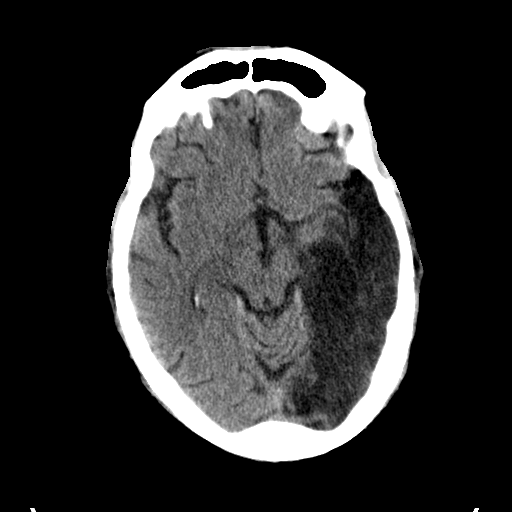
[im 12/32  bone]
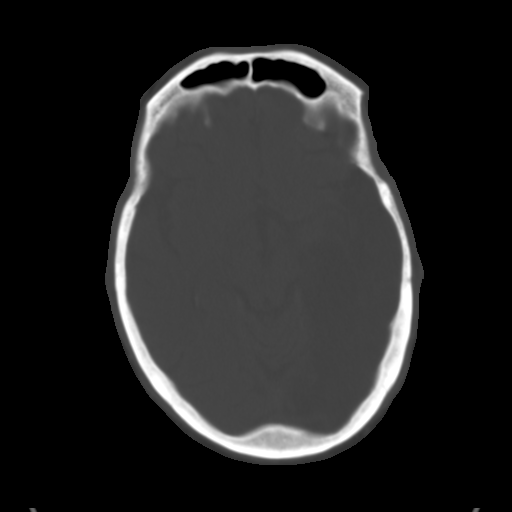
[im 14/32  brain]
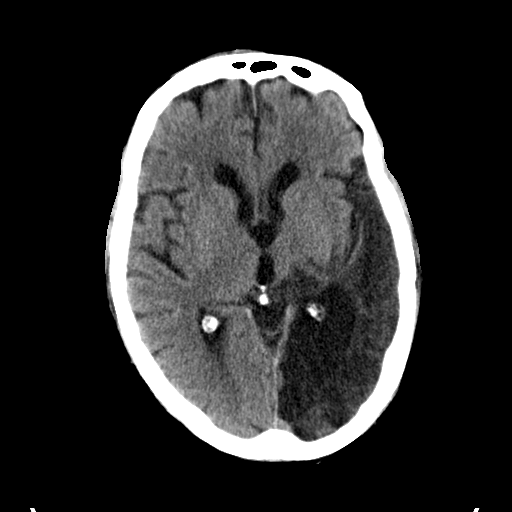
[im 16/32  brain]
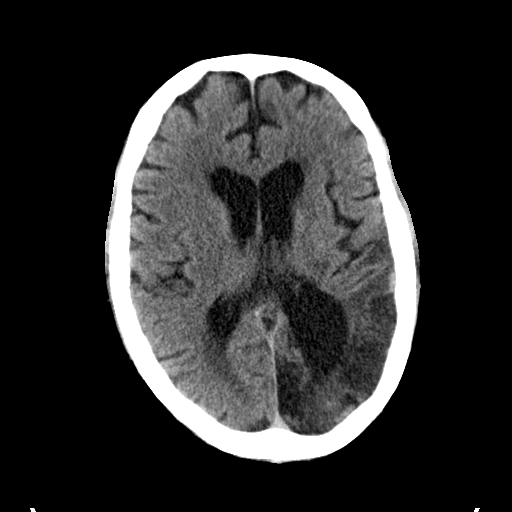
[im 18/32  brain]
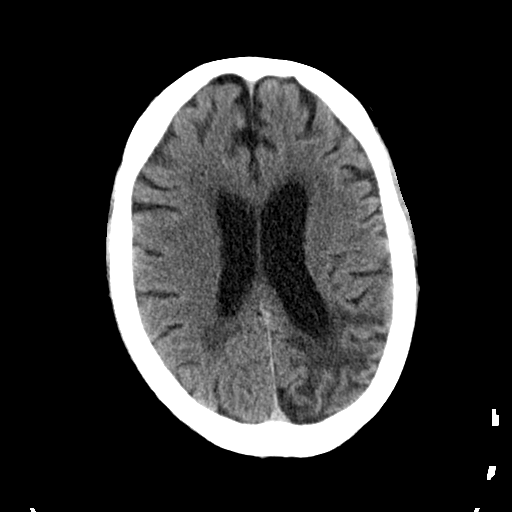
[im 20/32  brain]
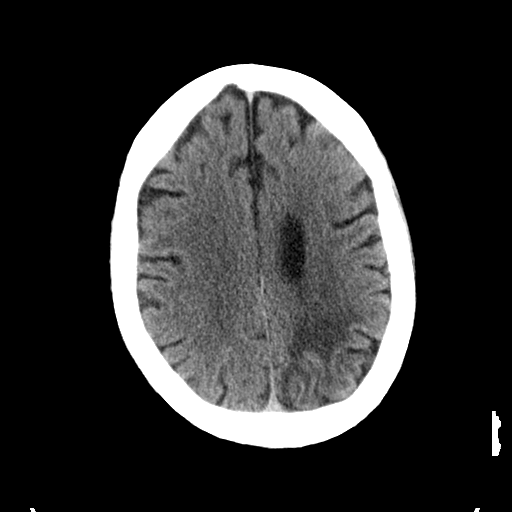
[im 20/32  bone]
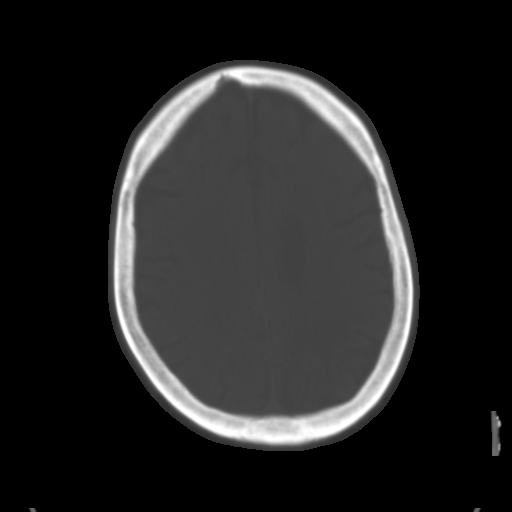
[im 23/32  brain]
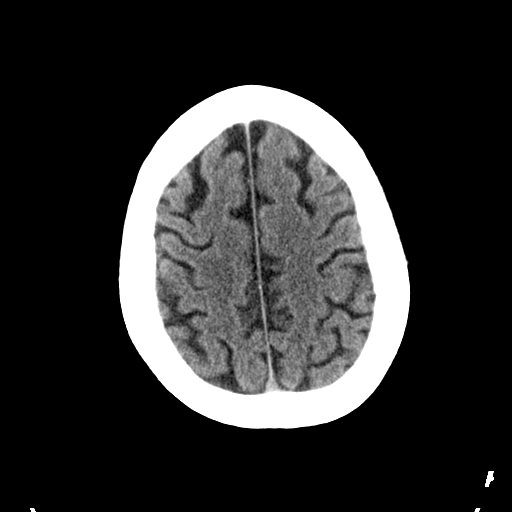
[im 25/32  brain]
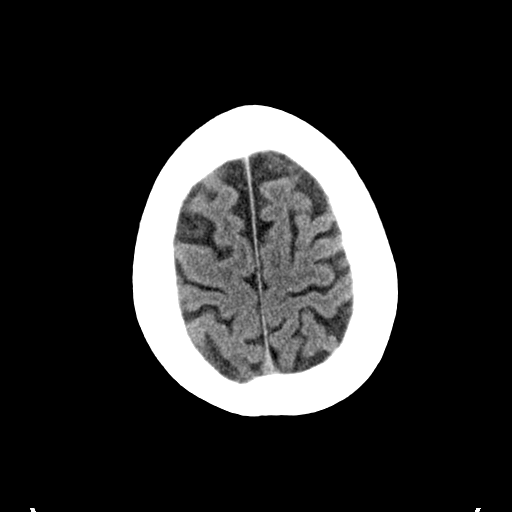
[im 27/32  brain]
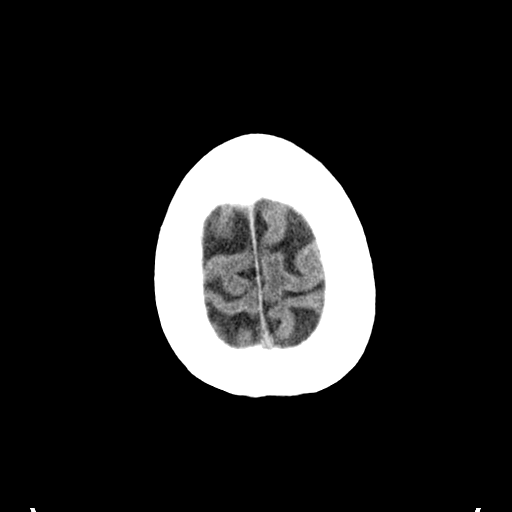
[im 29/32  brain]
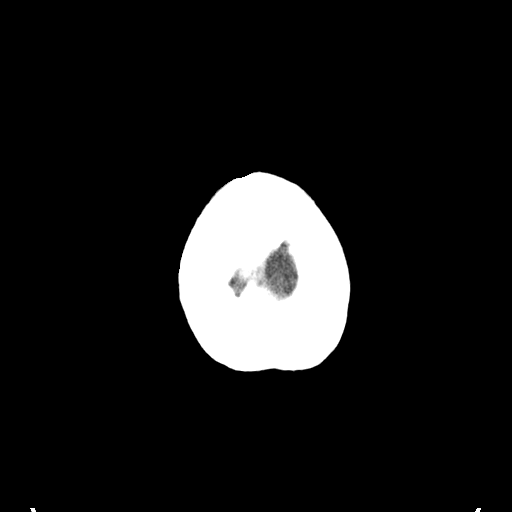
[im 29/32  bone]
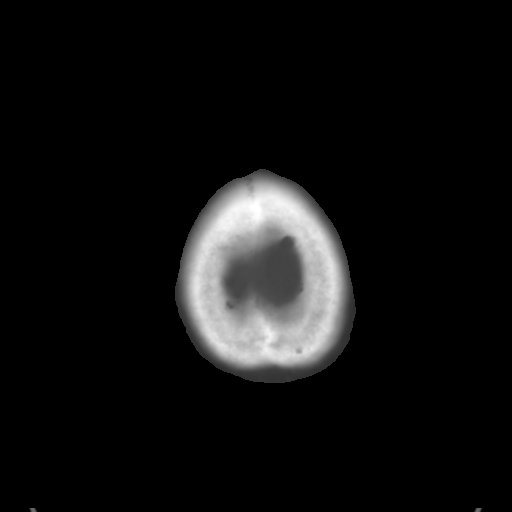

[Series 3: bone · axial · 0.46mm/px · z∈[-64,-44]mm · 2 of 32 slices shown]
[im 3/32  bone]
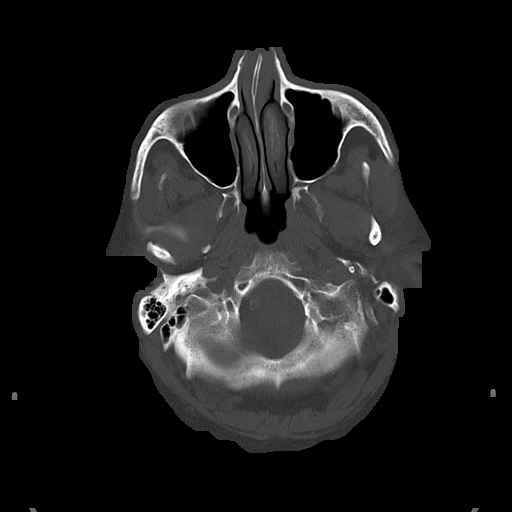
[im 7/32  bone]
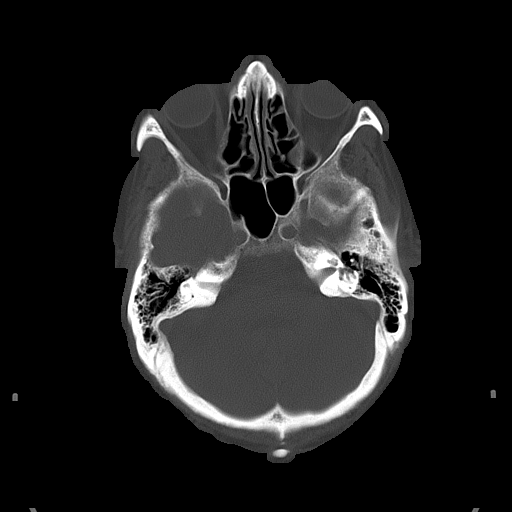

[15 of 30 positions shown; findings below may reference images not displayed]

FINDINGS: Diffuse encephalomalacic change is appreciated involving the left
temporal lobe. There is no evidence of acute hemorrhage nor further evidence
of intra-axial nor extra-axial fluid collections. There is no evidence of
mass effect. Compensatory lateral ventricular enlargement is appreciated on
the left particularly involving the atrium of the lateral ventricle. The
ventricles otherwise patent. Areas of low attenuation project within the
subcortical, deep, and periventricular white matter regions. The pons and
cerebellum are grossly unremarkable. There is no evidence of a depressed
skull fracture.
IMPRESSION: Extensive chronic changes involving the left temporal lobe.
2. No evidence of acute abnormalities.

## 2013-08-18 LAB — COMPREHENSIVE METABOLIC PANEL
ALK PHOS: 150 U/L — AB
Albumin: 3.9 g/dL (ref 3.4–5.0)
Anion Gap: 6 — ABNORMAL LOW (ref 7–16)
BUN: 15 mg/dL (ref 7–18)
Bilirubin,Total: 0.3 mg/dL (ref 0.2–1.0)
CALCIUM: 8.8 mg/dL (ref 8.5–10.1)
CHLORIDE: 103 mmol/L (ref 98–107)
CO2: 29 mmol/L (ref 21–32)
Creatinine: 1.5 mg/dL — ABNORMAL HIGH (ref 0.60–1.30)
EGFR (African American): 53 — ABNORMAL LOW
EGFR (Non-African Amer.): 46 — ABNORMAL LOW
Glucose: 123 mg/dL — ABNORMAL HIGH (ref 65–99)
Osmolality: 278 (ref 275–301)
Potassium: 4.1 mmol/L (ref 3.5–5.1)
SGOT(AST): 14 U/L — ABNORMAL LOW (ref 15–37)
SGPT (ALT): 24 U/L (ref 12–78)
Sodium: 138 mmol/L (ref 136–145)
Total Protein: 8.1 g/dL (ref 6.4–8.2)

## 2013-08-18 LAB — URINALYSIS, COMPLETE
BILIRUBIN, UR: NEGATIVE
BLOOD: NEGATIVE
Bacteria: NONE SEEN
GLUCOSE, UR: NEGATIVE mg/dL (ref 0–75)
KETONE: NEGATIVE
NITRITE: NEGATIVE
Ph: 7 (ref 4.5–8.0)
Protein: NEGATIVE
SPECIFIC GRAVITY: 1.009 (ref 1.003–1.030)

## 2013-08-18 LAB — CBC WITH DIFFERENTIAL/PLATELET
BASOS PCT: 0.4 %
Basophil #: 0 10*3/uL (ref 0.0–0.1)
Eosinophil #: 0.1 10*3/uL (ref 0.0–0.7)
Eosinophil %: 0.7 %
HCT: 43.5 % (ref 40.0–52.0)
HGB: 14.6 g/dL (ref 13.0–18.0)
LYMPHS PCT: 29.2 %
Lymphocyte #: 2.4 10*3/uL (ref 1.0–3.6)
MCH: 32 pg (ref 26.0–34.0)
MCHC: 33.7 g/dL (ref 32.0–36.0)
MCV: 95 fL (ref 80–100)
MONOS PCT: 9.2 %
Monocyte #: 0.7 x10 3/mm (ref 0.2–1.0)
NEUTROS ABS: 4.9 10*3/uL (ref 1.4–6.5)
NEUTROS PCT: 60.5 %
PLATELETS: 228 10*3/uL (ref 150–440)
RBC: 4.58 10*6/uL (ref 4.40–5.90)
RDW: 14.8 % — AB (ref 11.5–14.5)
WBC: 8.1 10*3/uL (ref 3.8–10.6)

## 2013-08-18 LAB — TROPONIN I
Troponin-I: <0.02 ng/mL
Troponin-I: <0.02 ng/mL

## 2013-08-18 LAB — LIPASE, BLOOD: Lipase: 136 U/L (ref 73–393)

## 2013-08-18 LAB — TSH: Thyroid Stimulating Horm: 3.38 u[IU]/mL

## 2013-08-19 ENCOUNTER — Observation Stay: Payer: Self-pay | Admitting: Internal Medicine

## 2013-08-19 LAB — TROPONIN I: Troponin-I: <0.02 ng/mL

## 2013-08-19 LAB — CK-MB
CK-MB: 1.6 ng/mL (ref 0.5–3.6)
CK-MB: 1.7 ng/mL (ref 0.5–3.6)
CK-MB: 1.5 ng/mL (ref 0.5–3.6)

## 2013-08-19 LAB — PHENYTOIN LEVEL, TOTAL: Dilantin: 4.3 ug/mL — ABNORMAL LOW (ref 10.0–20.0)

## 2013-08-19 LAB — APTT: ACTIVATED PTT: 46.5 s — AB (ref 23.6–35.9)

## 2013-08-19 LAB — PROTIME-INR
INR: 2.5
PROTHROMBIN TIME: 26.2 s — AB (ref 11.5–14.7)

## 2013-08-19 LAB — CLOSTRIDIUM DIFFICILE(ARMC)

## 2013-08-20 LAB — CBC WITH DIFFERENTIAL/PLATELET
BASOS PCT: 0.4 %
Basophil #: 0 10*3/uL (ref 0.0–0.1)
EOS ABS: 0.1 10*3/uL (ref 0.0–0.7)
EOS PCT: 1.5 %
HCT: 37 % — ABNORMAL LOW (ref 40.0–52.0)
HGB: 12.8 g/dL — ABNORMAL LOW (ref 13.0–18.0)
LYMPHS ABS: 2.5 10*3/uL (ref 1.0–3.6)
LYMPHS PCT: 36.9 %
MCH: 33.3 pg (ref 26.0–34.0)
MCHC: 34.7 g/dL (ref 32.0–36.0)
MCV: 96 fL (ref 80–100)
MONO ABS: 0.8 x10 3/mm (ref 0.2–1.0)
MONOS PCT: 10.9 %
NEUTROS PCT: 50.3 %
Neutrophil #: 3.5 10*3/uL (ref 1.4–6.5)
Platelet: 184 10*3/uL (ref 150–440)
RBC: 3.86 10*6/uL — AB (ref 4.40–5.90)
RDW: 14.8 % — ABNORMAL HIGH (ref 11.5–14.5)
WBC: 6.9 10*3/uL (ref 3.8–10.6)

## 2013-08-20 LAB — BASIC METABOLIC PANEL
Anion Gap: 6 — ABNORMAL LOW (ref 7–16)
BUN: 14 mg/dL (ref 7–18)
CREATININE: 1.49 mg/dL — AB (ref 0.60–1.30)
Calcium, Total: 8.6 mg/dL (ref 8.5–10.1)
Chloride: 108 mmol/L — ABNORMAL HIGH (ref 98–107)
Co2: 26 mmol/L (ref 21–32)
EGFR (African American): 54 — ABNORMAL LOW
GFR CALC NON AF AMER: 46 — AB
GLUCOSE: 109 mg/dL — AB (ref 65–99)
Osmolality: 280 (ref 275–301)
POTASSIUM: 4 mmol/L (ref 3.5–5.1)
Sodium: 140 mmol/L (ref 136–145)

## 2013-08-20 LAB — STOOL CULTURE

## 2013-08-20 LAB — MAGNESIUM: Magnesium: 2.2 mg/dL

## 2013-08-20 LAB — PROTIME-INR
INR: 3.3
Prothrombin Time: 32.7 secs — ABNORMAL HIGH (ref 11.5–14.7)

## 2013-09-22 ENCOUNTER — Emergency Department: Payer: Self-pay | Admitting: Emergency Medicine

## 2013-09-22 LAB — URINALYSIS, COMPLETE
BLOOD: NEGATIVE
Bilirubin,UR: NEGATIVE
GLUCOSE, UR: NEGATIVE mg/dL (ref 0–75)
KETONE: NEGATIVE
Nitrite: NEGATIVE
Ph: 8 (ref 4.5–8.0)
Protein: 30
RBC,UR: 3 /HPF (ref 0–5)
Specific Gravity: 1.018 (ref 1.003–1.030)

## 2013-09-22 LAB — CBC
HCT: 42 % (ref 40.0–52.0)
HGB: 14.2 g/dL (ref 13.0–18.0)
MCH: 32.4 pg (ref 26.0–34.0)
MCHC: 33.9 g/dL (ref 32.0–36.0)
MCV: 96 fL (ref 80–100)
Platelet: 204 10*3/uL (ref 150–440)
RBC: 4.4 10*6/uL (ref 4.40–5.90)
RDW: 14.7 % — ABNORMAL HIGH (ref 11.5–14.5)
WBC: 10.8 10*3/uL — AB (ref 3.8–10.6)

## 2013-09-22 LAB — TROPONIN I

## 2013-09-22 LAB — COMPREHENSIVE METABOLIC PANEL
ALK PHOS: 134 U/L — AB
ANION GAP: 3 — AB (ref 7–16)
AST: 22 U/L (ref 15–37)
Albumin: 3.5 g/dL (ref 3.4–5.0)
BILIRUBIN TOTAL: 0.3 mg/dL (ref 0.2–1.0)
BUN: 12 mg/dL (ref 7–18)
CALCIUM: 8.4 mg/dL — AB (ref 8.5–10.1)
CO2: 28 mmol/L (ref 21–32)
CREATININE: 1.21 mg/dL (ref 0.60–1.30)
Chloride: 106 mmol/L (ref 98–107)
EGFR (Non-African Amer.): 59 — ABNORMAL LOW
Glucose: 105 mg/dL — ABNORMAL HIGH (ref 65–99)
OSMOLALITY: 274 (ref 275–301)
Potassium: 4.2 mmol/L (ref 3.5–5.1)
SGPT (ALT): 17 U/L (ref 12–78)
Sodium: 137 mmol/L (ref 136–145)
TOTAL PROTEIN: 7.4 g/dL (ref 6.4–8.2)

## 2013-09-22 LAB — LIPASE, BLOOD: Lipase: 104 U/L (ref 73–393)

## 2013-09-22 LAB — PROTIME-INR
INR: 2.3
PROTHROMBIN TIME: 24.6 s — AB (ref 11.5–14.7)

## 2014-10-27 NOTE — H&P (Signed)
PATIENT NAME:  BRECKAN, CAFIERO MR#:  [id number] DATE OF BIRTH:  04-24-1941  DATE OF ADMISSION:  08/19/2013  REFERRING PHYSICIAN:  Dr. Jacqualine Code.   PRIMARY CARE PHYSICIAN:  Dr. Claudia Desanctis.  PRIMARY CARDIOLOGIST: Follow up with Dr. Saralyn Pilar; last time seen him before two years, more than 40 years ago used to follow with [facility], but no more.   CHIEF COMPLAINT: Epigastric pain.   HISTORY OF PRESENT ILLNESS: This is a 74 year old male with significant past medical history of CVA with aphasia, right-sided weakness, as well as extensive coronary artery disease status post stent in 2010 and paroxysmal atrial fibrillation, usually rate-controlled, presents with complaints of epigastric pain. The patient has aphasia, very poor historian, daughter helped to give the history. Reports basically has not been having neck, complaining of occasional chest pain over the last week, where he was at his PCP office, which improved with Mylanta, as well today he was at his office, where he was given Mylanta, which improved as well, but he has been complaining of epigastric pain as well which prompted her to bring him to ED. In the ED,  patient was noticed to diarrhea, so far four episodes in the last few hours, watery, was sent for stool cultures and C. diff and results are still pending. The patient had CT of abdomen and pelvis which did not show any acute findings. Upon presentation,  patient's heart rate was controlled, but the patient started to have A. fib with RVR, heart rate in the 140s. The patient received his p.o. metoprolol dose late. Hospitalist service was requested to admit the patient for further evaluation and treatment for his A. fib with RVR and diarrhea.  As well, patient is on p.o. Bactrim for the last week for diagnosis of UTI as well. The patient had negative troponin x 2. EKG does not show any abnormal changes or ST/T wave abnormalities. The patient did receive 10 of IV Cardizem in the ED  with significant improvement of his heart rate where currently it is in the 80s and 90s.   PAST MEDICAL HISTORY: 1.  Coronary artery disease, status post bare metal stent in the LAD in March 2000.   2.  Status post CVA with residual right hemiparesis. 3.  Paroxysmal atrial fibrillation.  4.  Hyperlipidemia.  5.  Hypertension.  6.  Seizure disorder.  7.  History of hepatitis.    SOCIAL HISTORY: The patient lives at home. He quit smoking tobacco 2007.   FAMILY HISTORY: No family history of coronary artery disease at young age.   ALLERGIES:  No known drug allergies.   HOME MEDICATIONS:  1.  Tylenol as needed.  2.  Tamsulosin 0.4 mg oral daily.  3.  Dilantin ER 100 mg 3 capsules at bedtime.  4.  Warfarin 4 mg oral daily. 5.   Crestor 10 mg daily. 6.   Seroquel 50 mg 2 times a day. 7.   Metoprolol 25 mg 2 times a day. 8.   Bactrim, double strength 1 tablet oral 2 times a day,  total of 10 day; already been on it for seven days.  9.  Levothyroxine 50 mcg oral daily.  10.  Calcium with vitamin D 1 capsule 2 times a day.   REVIEW OF SYSTEMS:   CONSTITUTIONAL:  The patient is extremely poor history, has mild dementia as well aphasia, cannot give reliable review of systems, but tried to do my best with the assistance of his daughter. Denies any fever,  chills, fatigue, weakness, weight gain, weight loss.   EYES: Denies blurry vision, double vision, inflammation, glaucoma.  ENT: Denies tinnitus, denies ear pain. No discharge.  RESPIRATORY: Denies cough, wheezing, hemoptysis, shortness of breath.  CARDIOVASCULAR: Denies chest pain, edema, arrhythmia, palpitation, syncope.  GASTROINTESTINAL: Denies any nausea, vomiting, constipation, complains of mild epigastric abdominal pain and diarrhea.  GENITOURINARY: Denies dysuria, hematuria, renal colic.  ENDOCRINE: Denies polyuria or polydipsia, heat or cold intolerance.  HEMATOLOGY: Denies anemia, easy bruising, bleeding diathesis.    INTEGUMENTARY: Denies acne, rash or skin lesion.  MUSCULOSKELETAL: Has right-side weakness, ambulates with a walker.  NEUROLOGIC: History of CVA, seizures, mild dementia and right side weakness.  PSYCHIATRIC:  Denies anxiety, insomnia, depression.   PHYSICAL EXAMINATION: VITAL SIGNS: Temperature 98.3, pulse 151, respiratory rate 18, blood pressure 116/74, saturating 76% on room air. Currently, the patient on telemetry monitor. Heart rate of 93.  GENERAL: Well-developed, well-nourished male, obese, looks comfortable and in no apparent distress.  HEENT: Head atraumatic, normocephalic. Pupils are equal and reactive to light. Pink conjunctivae. Anicteric sclerae. Moist oral mucosa.  NECK: Supple. No thyromegaly. No JVD. No carotid bruits.  CHEST: Good air entry bilaterally. No wheezing, rales, rhonchi.  CARDIOVASCULAR: S1, S2 heard. No rubs, murmurs or gallops.  ABDOMEN: Obese, nontender, nondistended. Bowel sounds present.  Had mild tenderness in the upper abdominal area, but no rebound, no guarding.  EXTREMITIES: No edema. No clubbing. No cyanosis. Pedal pulses +2 bilaterally.  PSYCHIATRIC: The patient is pleasant, mildly confused. Awake, alert.  NEUROLOGIC: The patient is awake, cranial nerves grossly intact. No facial droop, but the patient appears to be having mild expressive aphasia. Motor has right side weakness with contraction of right upper extremity.  SKIN: Normal skin turgor. Warm and dry.  MUSCULOSKELETAL: No joint effusion or erythema.   PERTINENT LABORATORY DATA: Glucose 123, BUN 15, creatinine 1.5, sodium 138, potassium 4.1, chloride 103, CO2 29, lipase 136, ALT 24, AST 14, alk phos 150, troponin less than 0.0-2 x 2. Dilantin 4.3, TSH 3.38. White blood cell 8.1, hemoglobin 14.6, hematocrit 43.5, platelets 228. INR 2.5. Urinalysis showing 1 white blood cell and trace leukocyte esterase.   IMAGING STUDIES: CT abdomen and pelvis with contrast showing no acute abnormality to explain  the patient's clinical symptoms, negative for hepatomegaly.   ASSESSMENT AND PLAN: 1.  Atrial fibrillation with rapid ventricular response, patient known to have history of atrial fibrillation, most likely exacerbated by his diarrhea. Will hydrate appropriately. We will continue him on metoprolol. We will consult pharmacy to dose his warfarin. His INR is therapeutic. The patient had transient improvement after receiving one dose of IV Cardizem. Actually, currently it is stuck in the 130s. We will try to manage with p.r.n. Cardizem pushes. If no improvement, we will start him on Cardizem drip.  2.  Diarrhea with mild epigastric abdominal pain. Imaging is negative for any colitis. This is most likely related to Clostridium difficile versus gastroenteritis. We are still awaiting Clostridium difficile results, but the patient is afebrile, has no leukocytosis.  3. History of coronary artery disease. The patient has negative troponins x 2, no acute EKG changes. Actually, he does not have a chest pain; it is more of an epigastric pain.  4.  Hyperlipidemia. Continue with statin. 5. Hypertension: Blood pressure is acceptable. Continue with home meds.  Seizure disorder. Continue with Dilantin.  6. Deep vein thrombosis prophylaxis. The patient is on full dose anticoagulation with warfarin with therapeutic INR.  7. CODE STATUS: Discussed with daughter  at bedside. She is his healthcare power of attorney. The patient is DO NOT RESUSCITATE. Actually, she brought the DO NOT RESUSCITATE form with her;  he does not have a LIVING WILL.   Total time spent on admission and patient care: 55 minutes.    ____________________________ Albertine Patricia, MD dse:NTS D: 08/19/2013 02:49:26 ET T: 08/19/2013 04:16:18 ET JOB#: [id number]  cc: Albertine Patricia, MD, <Dictator>  Blong Busk Graciela Husbands MD ELECTRONICALLY SIGNED 08/20/2013 0:05

## 2014-10-27 NOTE — Consult Note (Signed)
PATIENT NAME:  Darrell Hayes, Darrell Hayes MR#:  [id number] DATE OF BIRTH:  1940/12/13  DATE OF CONSULTATION:  08/19/2013  REFERRING PHYSICIAN:  Ramonita LabAruna Gouru, MD CONSULTING PHYSICIAN:  Lamar BlinksBruce J. Felipe Paluch, MD  REASON FOR CONSULTATION: Coronary artery disease with previous stroke, hypertension, with atrial fibrillation with rapid ventricular rate.   CHIEF COMPLAINT: "I feel better now."   HISTORY OF PRESENT ILLNESS: This is a 74 year old male with known previous cardiovascular disease, with recent controlled stroke and right hemiparesis. The patient has been on appropriate medications for this. Additionally, he has had coronary artery disease, and stable at this time, with no current previous evidence of chest pain or shortness of breath, although he has had a urinary tract infection exacerbating his current illness and condition, for which he had atrial fibrillation with rapid ventricular rate and chest discomfort. The substernal chest discomfort lasted for a short period of time, but did relieve when the patient had resumption of a normal sinus rhythm. Currently, the patient is in normal sinus rhythm with no other EKG changes, myocardial infarction. Troponin and CK-MB are normal.   REVIEW OF SYSTEMS: Positive for diarrhea. The remainder of review of systems negative for vision change, ringing in the ears, hearing loss, cough, congestion, heartburn, nausea, vomiting, headaches, blackouts, dizzy spells, nosebleeds, congestion, trouble swallowing, frequent urination, urination at night, muscle weakness, numbness, anxiety, depression, skin lesions or skin rashes.   PAST MEDICAL HISTORY:  1. Paroxysmal atrial fibrillation.  2. Coronary artery disease.  3. Peripheral vascular disease with cerebrovascular accident.  4. Hypertension.   FAMILY HISTORY: No family members with early onset of cardiovascular disease or hypertension.   SOCIAL HISTORY: Currently denies alcohol or tobacco use.   ALLERGIES: As listed.    MEDICATIONS: As listed.   PHYSICAL EXAMINATION:  VITAL SIGNS: Blood pressure is 134/68 bilaterally, heart rate is 72 upright, reclining, and regular.  GENERAL: He is a well-appearing male in no acute distress.  HEAD, EYES, EARS, NOSE AND THROAT: No icterus, thyromegaly, ulcers, hemorrhage or xanthelasma.  CARDIOVASCULAR: Regular rate and rhythm. Normal S1 and S2. A 2/6 apical murmur consistent with mitral regurgitation. PMI is diffuse. Carotid upstroke normal without bruit. Jugular venous pressure is normal.  LUNGS: Have a few basilar crackles with normal respirations.  ABDOMEN: Soft, nontender, without hepatosplenomegaly or masses. Abdominal aorta is normal size without bruit.  EXTREMITIES: Show 2+ radial, femoral and dorsal pedal pulses with trace to 1+ lower extremity edema. No cyanosis, clubbing or ulcers.  NEUROLOGICAL: He is oriented to time, place and person, with normal mood and affect.   ASSESSMENT: A 74 year old male with stroke, hemiparesis, peripheral vascular disease, hypertension, hyperlipidemia, coronary artery disease, paroxysmal atrial fibrillation, now in normal sinus rhythm, with chest pain and no current evidence of myocardial infarction.   RECOMMENDATIONS:  1. Continue medication management for urinary tract infection, likely exacerbating above symptoms.  2. Continue current medical regimen for maintenance of normal sinus rhythm, beta blocker, and  further movement and/or ambulation if able, following for recurrence of atrial fibrillation.  3. Further risk reduction in stroke with atrial fibrillation with anticoagulation.  4. No further cardiac diagnostics at this time due to no evidence of myocardial infarction and resolution of chest pain, likely secondary to atrial fibrillation and current illness.  5. Possible discharge to home with followup of atrial fibrillation and chest pain as an outpatient next week.   ____________________________ Lamar BlinksBruce J. Eiden Bagot,  MD bjk:lb D: 08/19/2013 11:58:12 ET T: 08/19/2013 12:23:38 ET JOB#: [id number]  cc: Smitty CordsBruce  Mardene Sayer, MD, <Dictator> Lamar Blinks MD ELECTRONICALLY SIGNED 08/20/2013 17:12

## 2014-10-27 NOTE — Discharge Summary (Signed)
PATIENT NAME:  Darrell Hayes, Vasili C MR#:  [id number] DATE OF BIRTH:  [birth year]  DATE OF ADMISSION:  08/19/2013 DATE OF DISCHARGE:  08/20/2013  REASON FOR ADMISSION: Epigastric pain.   DISCHARGE DIAGNOSES: 1.  Atrial fibrillation with rapid ventricular response.  2.  Diarrhea/acute gastroenteritis.  3.  History of coronary artery disease.  4.  Hyperlipidemia.  5.  Hypertension.  6.  Seizure disorder.  7.  Acute kidney injury, now resolved.   DISPOSITION: Home. Continue on [facility] program.   MEDICATIONS AT DISCHARGE: Levothyroxine 50 mcg once a day, calcium with vitamin D 2 times daily, Crestor 10 mg every night, Seroquel 50 mg twice daily, phenytoin 100 mg 3 capsules once a day, metoprolol 25 mg twice daily, Flomax 0.4 mg once a day, Tylenol as needed for pain or fever, Coumadin 4 mg every day, Bactrim DS 800/160, two times a day. This is previous prescription that the patient is due to finish in the next couple of days.   FOLLOWUP:  Dr. Camille Balynthia Dunham.    HOSPITAL COURSE: This is a very nice 74 year old gentleman who has a history of atrial fibrillation, which is chronic and coronary artery disease. 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 He also has hyperlipidemia, hypertension, seizures and he is on the [facility]ACE program for rehabilitation.  The patient presented to the Emergency Department complaining of epigastric pain. Right at the arrival in the Emergency Department, the patient started to have profuse diarrhea that he did not have before he arrived. The patient was taking Mylanta at home and was not feeling very well for which he was admitted. Apparently, the patient has not been eating or drinking much for the past 2 days, not even taking all of his medications because of the significant GI discomfort. On admission, his pulse was 151 with a blood pressure of 116/74. He looked slightly dehydrated and several studies were done including CT of the abdomen and pelvis with contrast  showing no acute abnormalities. The patient was admitted and evaluated as an observation. His atrial fibrillation with rapid ventricular response responded very well to a dose of IV Cardizem and then restarting his home medications. The patient slowed down and kept his rate stable in the 60s and 70s the following day. Orthostatics were done showing that his blood pressure lying was 102/67 and dropped to 91/61 with change of position. The patient was mildly orthostatic. He felt dizzy and lightheaded while doing this testing for which IV fluids were provided. He had a bump in his creatinine up to 1.5 and his Dilantin level was low at 4.3, but this is secondary to being unable to eat or drink much due to the stomach upset. His stool testing for clostridium difficile was negative and there was no Shigella, E. coli or Campylobacter detected on his stool sample. The patient started feeling very well the following day of admission for which we discharged him in good condition.   I spent about of 40 minutes discharging this patient.    ____________________________ Felipa Furnaceoberto Sanchez Gutierrez, MD rsg:dp D: 08/25/2013 07:35:28 ET T: 08/25/2013 08:01:18 ET JOB#: [id number]  cc: Felipa Furnaceoberto Sanchez Gutierrez, MD, <Dictator> Camille Balynthia Dunham, MD Pearletha FurlOBERTO SANCHEZ GUTIERRE MD ELECTRONICALLY SIGNED 08/26/2013 20:22

## 2017-09-14 ENCOUNTER — Encounter
Admission: RE | Admit: 2017-09-14 | Discharge: 2017-09-14 | Disposition: A | Discharge status: Home / Self Care | Payer: Medicare Other | Source: Ambulatory Visit | Attending: Internal Medicine | Admitting: Internal Medicine

## 2018-02-03 ENCOUNTER — Encounter: Admission: RE | Admit: 2018-02-03 | Payer: Medicare Other | Source: Ambulatory Visit | Admitting: Internal Medicine

## 2018-11-23 ENCOUNTER — Telehealth: Payer: Self-pay | Admitting: Cardiology

## 2018-11-23 NOTE — Telephone Encounter (Signed)
Will forward for dr crenshaw review  

## 2018-11-23 NOTE — Telephone Encounter (Signed)
New Message:    Daughter would like for Dr Jens Som only to please call give her a call. She says she needs to discuss his medicine with him please.

## 2018-11-24 NOTE — Telephone Encounter (Signed)
Left message for pt dtr to call  

## 2018-11-24 NOTE — Telephone Encounter (Signed)
havent seen since 2012 Darrell Hayes

## 2018-11-25 NOTE — Telephone Encounter (Signed)
Spoke with pt dtr, general questions regarding warfarin and xarelto answered.

## 2018-11-25 NOTE — Telephone Encounter (Signed)
° °  Daughter returned call, advised  her an appointment would be needed with Dr Jens Som. Declined appointment  Please call

## 2018-12-21 ENCOUNTER — Other Ambulatory Visit: Payer: Self-pay

## 2018-12-21 ENCOUNTER — Emergency Department: Payer: Medicare (Managed Care)

## 2018-12-21 ENCOUNTER — Inpatient Hospital Stay
Admission: EM | Admit: 2018-12-21 | Discharge: 2018-12-26 | DRG: 872 | Disposition: A | Discharge status: Skilled Nursing Facility | Payer: Medicare (Managed Care) | Attending: Internal Medicine | Admitting: Internal Medicine

## 2018-12-21 DIAGNOSIS — B952 Enterococcus as the cause of diseases classified elsewhere: Secondary | ICD-10-CM | POA: Diagnosis present

## 2018-12-21 DIAGNOSIS — G40909 Epilepsy, unspecified, not intractable, without status epilepticus: Secondary | ICD-10-CM | POA: Diagnosis present

## 2018-12-21 DIAGNOSIS — I69351 Hemiplegia and hemiparesis following cerebral infarction affecting right dominant side: Secondary | ICD-10-CM

## 2018-12-21 DIAGNOSIS — I25118 Atherosclerotic heart disease of native coronary artery with other forms of angina pectoris: Secondary | ICD-10-CM | POA: Diagnosis present

## 2018-12-21 DIAGNOSIS — Z823 Family history of stroke: Secondary | ICD-10-CM

## 2018-12-21 DIAGNOSIS — F0390 Unspecified dementia without behavioral disturbance: Secondary | ICD-10-CM | POA: Diagnosis present

## 2018-12-21 DIAGNOSIS — K810 Acute cholecystitis: Secondary | ICD-10-CM | POA: Diagnosis present

## 2018-12-21 DIAGNOSIS — Z9181 History of falling: Secondary | ICD-10-CM

## 2018-12-21 DIAGNOSIS — Z7901 Long term (current) use of anticoagulants: Secondary | ICD-10-CM

## 2018-12-21 DIAGNOSIS — N183 Chronic kidney disease, stage 3 (moderate): Secondary | ICD-10-CM | POA: Diagnosis present

## 2018-12-21 DIAGNOSIS — N179 Acute kidney failure, unspecified: Secondary | ICD-10-CM | POA: Diagnosis present

## 2018-12-21 DIAGNOSIS — D631 Anemia in chronic kidney disease: Secondary | ICD-10-CM | POA: Diagnosis present

## 2018-12-21 DIAGNOSIS — R197 Diarrhea, unspecified: Secondary | ICD-10-CM | POA: Diagnosis present

## 2018-12-21 DIAGNOSIS — I69398 Other sequelae of cerebral infarction: Secondary | ICD-10-CM

## 2018-12-21 DIAGNOSIS — Z8619 Personal history of other infectious and parasitic diseases: Secondary | ICD-10-CM

## 2018-12-21 DIAGNOSIS — I739 Peripheral vascular disease, unspecified: Secondary | ICD-10-CM | POA: Diagnosis not present

## 2018-12-21 DIAGNOSIS — N390 Urinary tract infection, site not specified: Secondary | ICD-10-CM | POA: Diagnosis present

## 2018-12-21 DIAGNOSIS — W19XXXA Unspecified fall, initial encounter: Secondary | ICD-10-CM | POA: Diagnosis present

## 2018-12-21 DIAGNOSIS — A419 Sepsis, unspecified organism: Secondary | ICD-10-CM | POA: Diagnosis present

## 2018-12-21 DIAGNOSIS — I69392 Facial weakness following cerebral infarction: Secondary | ICD-10-CM

## 2018-12-21 DIAGNOSIS — Z1159 Encounter for screening for other viral diseases: Secondary | ICD-10-CM | POA: Diagnosis not present

## 2018-12-21 DIAGNOSIS — I959 Hypotension, unspecified: Secondary | ICD-10-CM | POA: Diagnosis present

## 2018-12-21 DIAGNOSIS — K591 Functional diarrhea: Secondary | ICD-10-CM | POA: Diagnosis not present

## 2018-12-21 DIAGNOSIS — I129 Hypertensive chronic kidney disease with stage 1 through stage 4 chronic kidney disease, or unspecified chronic kidney disease: Secondary | ICD-10-CM | POA: Diagnosis present

## 2018-12-21 DIAGNOSIS — Z79890 Hormone replacement therapy (postmenopausal): Secondary | ICD-10-CM

## 2018-12-21 DIAGNOSIS — I4821 Permanent atrial fibrillation: Secondary | ICD-10-CM | POA: Diagnosis present

## 2018-12-21 DIAGNOSIS — R1011 Right upper quadrant pain: Secondary | ICD-10-CM | POA: Diagnosis present

## 2018-12-21 DIAGNOSIS — D509 Iron deficiency anemia, unspecified: Secondary | ICD-10-CM | POA: Diagnosis not present

## 2018-12-21 DIAGNOSIS — I4891 Unspecified atrial fibrillation: Secondary | ICD-10-CM | POA: Diagnosis not present

## 2018-12-21 DIAGNOSIS — E872 Acidosis: Secondary | ICD-10-CM | POA: Diagnosis present

## 2018-12-21 DIAGNOSIS — Z8349 Family history of other endocrine, nutritional and metabolic diseases: Secondary | ICD-10-CM

## 2018-12-21 DIAGNOSIS — Z87891 Personal history of nicotine dependence: Secondary | ICD-10-CM

## 2018-12-21 DIAGNOSIS — E860 Dehydration: Secondary | ICD-10-CM | POA: Diagnosis present

## 2018-12-21 DIAGNOSIS — K819 Cholecystitis, unspecified: Secondary | ICD-10-CM

## 2018-12-21 DIAGNOSIS — R791 Abnormal coagulation profile: Secondary | ICD-10-CM | POA: Diagnosis present

## 2018-12-21 DIAGNOSIS — Z87440 Personal history of urinary (tract) infections: Secondary | ICD-10-CM

## 2018-12-21 DIAGNOSIS — M7989 Other specified soft tissue disorders: Secondary | ICD-10-CM

## 2018-12-21 DIAGNOSIS — S80211A Abrasion, right knee, initial encounter: Secondary | ICD-10-CM | POA: Diagnosis present

## 2018-12-21 DIAGNOSIS — Z8249 Family history of ischemic heart disease and other diseases of the circulatory system: Secondary | ICD-10-CM

## 2018-12-21 DIAGNOSIS — Z7982 Long term (current) use of aspirin: Secondary | ICD-10-CM

## 2018-12-21 DIAGNOSIS — E785 Hyperlipidemia, unspecified: Secondary | ICD-10-CM | POA: Diagnosis present

## 2018-12-21 DIAGNOSIS — Z79899 Other long term (current) drug therapy: Secondary | ICD-10-CM

## 2018-12-21 LAB — URINALYSIS, COMPLETE (UACMP) WITH MICROSCOPIC
Bilirubin Urine: NEGATIVE
Glucose, UA: NEGATIVE mg/dL
Ketones, ur: NEGATIVE mg/dL
Nitrite: NEGATIVE
Protein, ur: 100 mg/dL — AB
Specific Gravity, Urine: 1.021 (ref 1.005–1.030)
Squamous Epithelial / LPF: NONE SEEN (ref 0–5)
WBC, UA: >50 WBC/hpf — ABNORMAL HIGH (ref 0–5)
pH: 5 (ref 5.0–8.0)

## 2018-12-21 LAB — LACTIC ACID, PLASMA
Lactic Acid, Venous: 2.4 mmol/L (ref 0.5–1.9)
Lactic Acid, Venous: 1.8 mmol/L (ref 0.5–1.9)

## 2018-12-21 LAB — CBC WITH DIFFERENTIAL/PLATELET
Abs Immature Granulocytes: 0.24 10*3/uL — ABNORMAL HIGH (ref 0.00–0.07)
Basophils Absolute: 0 10*3/uL (ref 0.0–0.1)
Basophils Relative: 0 %
Eosinophils Absolute: 0 10*3/uL (ref 0.0–0.5)
Eosinophils Relative: 0 %
HCT: 31.6 % — ABNORMAL LOW (ref 39.0–52.0)
Hemoglobin: 10.5 g/dL — ABNORMAL LOW (ref 13.0–17.0)
Immature Granulocytes: 1 %
Lymphocytes Relative: 7 %
Lymphs Abs: 1.3 10*3/uL (ref 0.7–4.0)
MCH: 32.2 pg (ref 26.0–34.0)
MCHC: 33.2 g/dL (ref 30.0–36.0)
MCV: 96.9 fL (ref 80.0–100.0)
Monocytes Absolute: 2.3 10*3/uL — ABNORMAL HIGH (ref 0.1–1.0)
Monocytes Relative: 12 %
Neutro Abs: 16 10*3/uL — ABNORMAL HIGH (ref 1.7–7.7)
Neutrophils Relative %: 80 %
Platelets: 345 10*3/uL (ref 150–400)
RBC: 3.26 MIL/uL — ABNORMAL LOW (ref 4.22–5.81)
RDW: 14.4 % (ref 11.5–15.5)
WBC: 19.8 10*3/uL — ABNORMAL HIGH (ref 4.0–10.5)
nRBC: 0 % (ref 0.0–0.2)

## 2018-12-21 LAB — COMPREHENSIVE METABOLIC PANEL
ALT: 42 U/L (ref 0–44)
AST: 72 U/L — ABNORMAL HIGH (ref 15–41)
Albumin: 3.2 g/dL — ABNORMAL LOW (ref 3.5–5.0)
Alkaline Phosphatase: 84 U/L (ref 38–126)
Anion gap: 16 — ABNORMAL HIGH (ref 5–15)
BUN: 56 mg/dL — ABNORMAL HIGH (ref 8–23)
CO2: 20 mmol/L — ABNORMAL LOW (ref 22–32)
Calcium: 8.6 mg/dL — ABNORMAL LOW (ref 8.9–10.3)
Chloride: 97 mmol/L — ABNORMAL LOW (ref 98–111)
Creatinine, Ser: 3.67 mg/dL — ABNORMAL HIGH (ref 0.61–1.24)
GFR calc Af Amer: 17 mL/min — ABNORMAL LOW (ref >60)
GFR calc non Af Amer: 15 mL/min — ABNORMAL LOW (ref >60)
Glucose, Bld: 170 mg/dL — ABNORMAL HIGH (ref 70–99)
Potassium: 3.9 mmol/L (ref 3.5–5.1)
Sodium: 133 mmol/L — ABNORMAL LOW (ref 135–145)
Total Bilirubin: 1 mg/dL (ref 0.3–1.2)
Total Protein: 7.5 g/dL (ref 6.5–8.1)

## 2018-12-21 LAB — PROTIME-INR
INR: 5.6 (ref 0.8–1.2)
Prothrombin Time: 49.7 seconds — ABNORMAL HIGH (ref 11.4–15.2)

## 2018-12-21 LAB — TROPONIN I: Troponin I: <0.03 ng/mL (ref <0.03)

## 2018-12-21 LAB — SARS CORONAVIRUS 2 BY RT PCR (HOSPITAL ORDER, PERFORMED IN ~~LOC~~ HOSPITAL LAB): SARS Coronavirus 2: NEGATIVE

## 2018-12-21 LAB — TSH: TSH: 3.576 u[IU]/mL (ref 0.350–4.500)

## 2018-12-21 MED ORDER — PIPERACILLIN-TAZOBACTAM 3.375 G IVPB 30 MIN
3.3750 g | Freq: Once | INTRAVENOUS | Status: AC
  Administered 2018-12-21: 3.375 g via INTRAVENOUS
  Filled 2018-12-21: qty 50

## 2018-12-21 MED ORDER — ACETAMINOPHEN 650 MG RE SUPP: 650.0000 mg | Freq: Four times a day (QID) | RECTAL | Status: DC | PRN

## 2018-12-21 MED ORDER — SODIUM CHLORIDE 0.9 % IV BOLUS
500.0000 mL | Freq: Once | INTRAVENOUS | Status: AC
  Administered 2018-12-21: 500 mL via INTRAVENOUS

## 2018-12-21 MED ORDER — POTASSIUM CHLORIDE IN NACL 20-0.9 MEQ/L-% IV SOLN
INTRAVENOUS | Status: DC
  Administered 2018-12-21 – 2018-12-25 (×7): via INTRAVENOUS
  Filled 2018-12-21 (×11): qty 1000

## 2018-12-21 MED ORDER — ONDANSETRON HCL 4 MG/2ML IJ SOLN: 4.0000 mg | Freq: Four times a day (QID) | INTRAMUSCULAR | Status: DC | PRN

## 2018-12-21 MED ORDER — ACETAMINOPHEN 325 MG PO TABS
650.0000 mg | ORAL_TABLET | Freq: Four times a day (QID) | ORAL | Status: DC | PRN
  Administered 2018-12-22: 11:00:00 650 mg via ORAL
  Filled 2018-12-21: qty 2

## 2018-12-21 MED ORDER — ALBUTEROL SULFATE (2.5 MG/3ML) 0.083% IN NEBU: 2.5000 mg | INHALATION_SOLUTION | RESPIRATORY_TRACT | Status: DC | PRN

## 2018-12-21 MED ORDER — PIPERACILLIN-TAZOBACTAM 3.375 G IVPB
3.3750 g | Freq: Three times a day (TID) | INTRAVENOUS | Status: DC
  Administered 2018-12-21 – 2018-12-24 (×8): 3.375 g via INTRAVENOUS
  Filled 2018-12-21 (×8): qty 50

## 2018-12-21 MED ORDER — SODIUM CHLORIDE 0.9 % IV BOLUS
1000.0000 mL | Freq: Once | INTRAVENOUS | Status: AC
  Administered 2018-12-21: 1000 mL via INTRAVENOUS

## 2018-12-21 MED ORDER — METRONIDAZOLE 500 MG PO TABS
500.0000 mg | ORAL_TABLET | Freq: Once | ORAL | Status: AC
  Administered 2018-12-21: 14:00:00 500 mg via ORAL
  Filled 2018-12-21: qty 1

## 2018-12-21 MED ORDER — LEVOTHYROXINE SODIUM 50 MCG PO TABS
50.0000 ug | ORAL_TABLET | Freq: Every evening | ORAL | Status: DC
  Administered 2018-12-21 – 2018-12-25 (×5): 50 ug via ORAL
  Filled 2018-12-21 (×5): qty 1

## 2018-12-21 MED ORDER — HEPARIN SODIUM (PORCINE) 5000 UNIT/ML IJ SOLN: 5000.0000 [IU] | Freq: Three times a day (TID) | INTRAMUSCULAR | Status: DC

## 2018-12-21 MED ORDER — TAMSULOSIN HCL 0.4 MG PO CAPS
0.8000 mg | ORAL_CAPSULE | Freq: Every day | ORAL | Status: DC
  Administered 2018-12-21 – 2018-12-25 (×5): 0.8 mg via ORAL
  Filled 2018-12-21 (×5): qty 2

## 2018-12-21 MED ORDER — ONDANSETRON HCL 4 MG PO TABS: 4.0000 mg | ORAL_TABLET | Freq: Four times a day (QID) | ORAL | Status: DC | PRN

## 2018-12-21 MED ORDER — VITAMIN K1 10 MG/ML IJ SOLN
5.0000 mg | Freq: Once | INTRAVENOUS | Status: AC
  Administered 2018-12-21: 5 mg via INTRAVENOUS
  Filled 2018-12-21: qty 0.5

## 2018-12-21 MED ORDER — MORPHINE SULFATE (PF) 4 MG/ML IV SOLN
4.0000 mg | INTRAVENOUS | Status: DC | PRN
  Administered 2018-12-22 – 2018-12-23 (×2): 4 mg via INTRAVENOUS
  Filled 2018-12-21 (×2): qty 1

## 2018-12-21 MED ORDER — HYDROCODONE-ACETAMINOPHEN 5-325 MG PO TABS
1.0000 | ORAL_TABLET | ORAL | Status: DC | PRN
  Administered 2018-12-24 – 2018-12-25 (×5): 1 via ORAL
  Filled 2018-12-21 (×5): qty 1

## 2018-12-21 NOTE — Consult Note (Signed)
Pharmacy Antibiotic Note  Darrell Hayes is a 78 y.o. male admitted on 12/21/2018 with intra-abdominal infection.  78 y.o. male presenting with possibly three day history of abdominal pain and decreased po intake (CT shows cholecystitis).    He recently was treated for UTI with Augmentin which was changed to Bactrim.  His last dose was 6 days ago.  Pharmacy has been consulted for Zosyn dosing.  Plan: Zosyn 3.375g IV q8h (4 hour infusion).   CrCl 20.3 ml/min currently - if worsens may need to extend dosing interval to q12h.  Pharmacy will continue to monitor.  Height: 5\' 8"  (172.7 cm) Weight: 250 lb (113.4 kg) IBW/kg (Calculated) : 68.4  Temp (24hrs), Avg:98.7 F (37.1 C), Min:98.7 F (37.1 C), Max:98.7 F (37.1 C)  Recent Labs  Lab 12/21/18 0956  WBC 19.8*  CREATININE 3.67*  LATICACIDVEN 2.4*    Estimated Creatinine Clearance: 20.3 mL/min (A) (by C-G formula based on SCr of 3.67 mg/dL (H)).    No Known Allergies  Antimicrobials this admission: Metronidazole 6/17 >> Zosyn 6/17 >>  Dose adjustments this admission: None  Microbiology results: 6/17 BCx: pending 6/17 UCx: pending  6/17 GI Panel/C Diff: Pending   COVID NEGATIVE  Thank you for allowing pharmacy to be a part of this patient's care.  Lu Duffel, PharmD, BCPS Clinical Pharmacist 12/21/2018 3:26 PM

## 2018-12-21 NOTE — ED Notes (Signed)
PT 49.7 INR 5.58 per lab- will notify Dr Burlene Arnt

## 2018-12-21 NOTE — Progress Notes (Signed)
Advanced Care Plan.  Purpose of Encounter: CODE STATUS. Parties in Attendance: The patient, his daughter and me. Patient's Decisional Capacity: No. Medical Story: Darrell Hayes  is a 78 y.o. male with a known history of CAD, CVA, hypertension, hyperlipidemia, seizure disorder and A. fib etc.   the patient is being admitted for sepsis due to acute cholecystitis, hypotension, A. fib with RVR.  I discussed with patient's daughter about his current condition, poor prognosis and CODE STATUS.  The patient daughter said that the patient wants to be resuscitated but no intubation. Plan:  Code Status: Partial code. Time spent discussing advance care planning: 18 minutes.

## 2018-12-21 NOTE — Plan of Care (Signed)
  Problem: Fluid Volume: Goal: Hemodynamic stability will improve Outcome: Progressing   Problem: Health Behavior/Discharge Planning: Goal: Ability to manage health-related needs will improve Outcome: Progressing   Problem: Respiratory: Goal: Ability to maintain adequate ventilation will improve Outcome: Progressing   Problem: Nutrition Goal: Nutritional status is improving Description: Monitor and assess patient for malnutrition (ex- brittle hair, bruises, dry skin, pale skin and conjunctiva, muscle wasting, smooth red tongue, and disorientation). Collaborate with interdisciplinary team and initiate plan and interventions as ordered.  Monitor patient's weight and dietary intake as ordered or per policy. Utilize nutrition screening tool and intervene per policy. Determine patient's food preferences and provide high-protein, high-caloric foods as appropriate.  Outcome: Progressing   Problem: Skin Integrity: Goal: Risk for impaired skin integrity will decrease Outcome: Progressing   Problem: Safety: Goal: Ability to remain free from injury will improve Outcome: Progressing   Problem: Pain Managment: Goal: General experience of comfort will improve Outcome: Progressing

## 2018-12-21 NOTE — ED Notes (Signed)
Dr Burlene Arnt notified of critical PT, INR, and Lactic- no new orders at this time

## 2018-12-21 NOTE — ED Notes (Signed)
ED TO INPATIENT HANDOFF REPORT  ED Nurse Name and Phone #: Sandi Towe [phone]  S Name/Age/Gender Darrell Hayes 78 y.o. male Room/Bed: ED05A/ED05A  Code Status   Code Status: Not on file  Home/SNF/Other Home Patient oriented to: self and place Is this baseline? Yes   Triage Complete: Triage complete  Chief Complaint sepsis alert  Triage Note Pt arrives via EMS after having a wound on his right leg, a UTI x2 weeks ago, hx of dementia, not urinating since yesterday at 2, and diarrhea- EMS called code sepsis- bp 87/59, afib rvr   Allergies No Known Allergies  Level of Care/Admitting Diagnosis ED Disposition    ED Disposition Condition Comment   Admit  Hospital Area: [facility] [[id number]]  Level of Care: Med-Surg [16]  Covid Evaluation: Confirmed COVID Negative  Diagnosis: Sepsis [facility](HCC) [[id number]]) [[id number]]  Admitting Physician: Shaune PollackHEN, QING [[id number]]  Attending Physician: Shaune PollackHEN, QING [phone][[id number]]  Estimated length of stay: 3 - 4 days  Certification:: I certify this patient will need inpatient services for at least 2 midnights  PT Class (Do Not Modify): Inpatient [101]  PT Acc Code (Do Not Modify): Private [1]       B Medical/Surgery History Past Medical History:  Diagnosis Date  . Agitation    and irritability, situational exacerbation  . Alveolar/parietoalveolar pneumonopathy, other    06/14/09 CT chest, Mild LLL GGO; 02/28/10 CT chest , improved  . Atrial fibrillation (HCC)    Coumadin therapy - Dr. Jens Somrenshaw.  Cardioversion 08/23/09, Amiodarone Rx since 09/03/2009  . CAD (coronary artery disease) 09/2008   Dr. Jens Somrenshaw, stent to LAD  . CVA (cerebral vascular accident) (HCC) 2007   In the setting of atrial fibrillation  . Dyspnea 09/2008   Class 3, since 2008.  Significant tachycardia.  No PE or fibrosis on CT chest 3/10, 12/10, 02/2010, normal PFT but isolated low DLCO  06/2009, normal hgb, TSH, creat and albumin , Dec 2010.  Resolved after A Fib cardioversion and  Amiodarone Marh 2011  . Hepatitis   . Hiatal hernia   . History of chickenpox   . Hyperlipidemia   . Hypertension   . Nonspecific abnormal results of pulmonary system function study   . Pulmonary nodules 09/2008   Nonspecific reticulonodular nodules RLL superior segment, resolved on CT 06/14/09  . Seizures (HCC) 12/2008   Dr. Sharene SkeansHickling   Past Surgical History:  Procedure Laterality Date  . APPENDECTOMY    . Stent placed  09/2008     A IV Location/Drains/Wounds Patient Lines/Drains/Airways Status   Active Line/Drains/Airways    Name:   Placement date:   Placement time:   Site:   Days:   Peripheral IV 12/21/18 Left Antecubital   12/21/18    0948    Antecubital   less than 1   Peripheral IV 12/21/18 Right Antecubital   12/21/18    1005    Antecubital   less than 1          Intake/Output Last 24 hours  Intake/Output Summary (Last 24 hours) at 12/21/2018 1523 Last data filed at 12/21/2018 1402 Gross per 24 hour  Intake 1100 ml  Output -  Net 1100 ml    Labs/Imaging Results for orders placed or performed during the hospital encounter of 12/21/18 (from the past 48 hour(s))  Lactic acid, plasma     Status: Abnormal   Collection Time: 12/21/18  9:56 AM  Result Value Ref Range   Lactic Acid, Venous 2.4 (HH) 0.5 - 1.9  mmol/L    Comment: CRITICAL RESULT CALLED TO, READ BACK BY AND VERIFIED WITH Perry MountARIEL WALLACE RN AT 1034 ON 12/21/18 SG/FW Performed at [facility], [address] [place], [place] [address]   Comprehensive metabolic panel     Status: Abnormal   Collection Time: 12/21/18  9:56 AM  Result Value Ref Range   Sodium 133 (L) 135 - 145 mmol/L   Potassium 3.9 3.5 - 5.1 mmol/L   Chloride 97 (L) 98 - 111 mmol/L   CO2 20 (L) 22 - 32 mmol/L   Glucose, Bld 170 (H) 70 - 99 mg/dL   BUN 56 (H) 8 - 23 mg/dL   Creatinine, Ser 6.043.67 (H) 0.61 - 1.24 mg/dL   Calcium 8.6 (L) 8.9 - 10.3 mg/dL   Total Protein 7.5 6.5 - 8.1 g/dL   Albumin 3.2 (L) 3.5 - 5.0 g/dL   AST 72 (H)  15 - 41 U/L   ALT 42 0 - 44 U/L   Alkaline Phosphatase 84 38 - 126 U/L   Total Bilirubin 1.0 0.3 - 1.2 mg/dL   GFR calc non Af Amer 15 (L) >60 mL/min   GFR calc Af Amer 17 (L) >60 mL/min   Anion gap 16 (H) 5 - 15    Comment: Performed at [facility], [address] [place], [place] [address]  CBC WITH DIFFERENTIAL     Status: Abnormal   Collection Time: 12/21/18  9:56 AM  Result Value Ref Range   WBC 19.8 (H) 4.0 - 10.5 K/uL   RBC 3.26 (L) 4.22 - 5.81 MIL/uL   Hemoglobin 10.5 (L) 13.0 - 17.0 g/dL   HCT 11.931.6 (L) 14.739.0 - 82.952.0 %   MCV 96.9 80.0 - 100.0 fL   MCH 32.2 26.0 - 34.0 pg   MCHC 33.2 30.0 - 36.0 g/dL   RDW 56.214.4 13.011.5 - 86.515.5 %   Platelets 345 150 - 400 K/uL   nRBC 0.0 0.0 - 0.2 %   Neutrophils Relative % 80 %   Neutro Abs 16.0 (H) 1.7 - 7.7 K/uL   Lymphocytes Relative 7 %   Lymphs Abs 1.3 0.7 - 4.0 K/uL   Monocytes Relative 12 %   Monocytes Absolute 2.3 (H) 0.1 - 1.0 K/uL   Eosinophils Relative 0 %   Eosinophils Absolute 0.0 0.0 - 0.5 K/uL   Basophils Relative 0 %   Basophils Absolute 0.0 0.0 - 0.1 K/uL   Immature Granulocytes 1 %   Abs Immature Granulocytes 0.24 (H) 0.00 - 0.07 K/uL    Comment: Performed at [facility], [address] [place], [place] [address]  Protime-INR     Status: Abnormal   Collection Time: 12/21/18  9:56 AM  Result Value Ref Range   Prothrombin Time 49.7 (H) 11.4 - 15.2 seconds   INR 5.6 (HH) 0.8 - 1.2    Comment: CRITICAL RESULT CALLED TO, READ BACK BY AND VERIFIED WITH: Maks Cavallero WALLACE @1044  ON 12/21/18 BY HKP (NOTE) INR goal varies based on device and disease states. Performed at [facility], [address] [place], [place] [address]   Troponin I - Add-On to previous collection     Status: None   Collection Time: 12/21/18  9:56 AM  Result Value Ref Range   Troponin I <0.03 <0.03 ng/mL    Comment: Performed at [facility], [address] [place], [place] [address]  TSH     Status: None    Collection Time: 12/21/18  9:56 AM  Result Value Ref Range   TSH 3.576 0.350 - 4.500 uIU/mL    Comment: Performed by a 3rd Generation assay with a functional sensitivity of <=0.01 uIU/mL. Performed at [facility], [address] [place], [place] [address]   Urinalysis, Complete w Microscopic     Status: Abnormal   Collection Time: 12/21/18 12:23 PM  Result Value Ref Range   Color, Urine AMBER (A) YELLOW    Comment: BIOCHEMICALS MAY BE AFFECTED BY COLOR   APPearance TURBID (A) CLEAR   Specific Gravity, Urine 1.021 1.005 - 1.030   pH 5.0 5.0 - 8.0   Glucose, UA NEGATIVE NEGATIVE mg/dL   Hgb urine dipstick MODERATE (A) NEGATIVE   Bilirubin Urine NEGATIVE NEGATIVE   Ketones, ur NEGATIVE NEGATIVE mg/dL   Protein, ur 604 (A) NEGATIVE mg/dL   Nitrite NEGATIVE NEGATIVE   Leukocytes,Ua MODERATE (A) NEGATIVE   RBC / HPF 11-20 0 - 5 RBC/hpf   WBC, UA >50 (H) 0 - 5 WBC/hpf   Bacteria, UA MANY (A) NONE SEEN   Squamous Epithelial / LPF NONE SEEN 0 - 5   WBC Clumps PRESENT    Mucus PRESENT    Hyaline Casts, UA PRESENT     Comment: Performed at [facility], [address], [place], [place] [address]  SARS Coronavirus 2 (CEPHEID - Performed in [facility] hospital lab), Hosp Order     Status: None   Collection Time: 12/21/18  1:50 PM   Specimen: Nasopharyngeal Swab  Result Value Ref Range   SARS Coronavirus 2 NEGATIVE NEGATIVE    Comment: (NOTE) If result is NEGATIVE SARS-CoV-2 target nucleic acids are NOT DETECTED. The SARS-CoV-2 RNA is generally detectable in upper and lower  respiratory specimens during the acute phase of infection. The lowest  concentration of SARS-CoV-2 viral copies this assay can detect is 250  copies / mL. A negative result does not preclude SARS-CoV-2 infection  and should not be used as the sole basis for treatment or other  patient management decisions.  A negative result may occur with  improper specimen collection / handling, submission of  specimen other  than nasopharyngeal swab, presence of viral mutation(s) within the  areas targeted by this assay, and inadequate number of viral copies  (<250 copies / mL). A negative result must be combined with clinical  observations, patient history, and epidemiological information. If result is POSITIVE SARS-CoV-2 target nucleic acids are DETECTED. The SARS-CoV-2 RNA is generally detectable in upper and lower  respiratory specimens dur ing the acute phase of infection.  Positive  results are indicative of active infection with SARS-CoV-2.  Clinical  correlation with patient history and other diagnostic information is  necessary to determine patient infection status.  Positive results do  not rule out bacterial infection or co-infection with other viruses. If result is PRESUMPTIVE POSTIVE SARS-CoV-2 nucleic acids MAY BE PRESENT.   A presumptive positive result was obtained on the submitted specimen  and confirmed on repeat testing.  While 2019 novel coronavirus  (SARS-CoV-2) nucleic acids may be present in the submitted sample  additional confirmatory testing may be necessary for epidemiological  and / or clinical management purposes  to differentiate between  SARS-CoV-2 and other Sarbecovirus currently known to infect humans.  If clinically indicated additional testing with an alternate test  methodology [phone]) is advised. The SARS-CoV-2 RNA is generally  detectable in upper and lower respiratory sp ecimens during the acute  phase of infection. The expected result is Negative. Fact  Sheet for Patients:  BoilerBrush.com.cyhttps://www.fda.gov/media/136312/download Fact Sheet for Healthcare Providers: https://pope.com/https://www.fda.gov/media/136313/download This test is not yet approved or cleared by the [place] FDA and has been authorized for detection and/or diagnosis of SARS-CoV-2 by FDA under an Emergency Use Authorization (EUA).  This EUA will remain in effect (meaning this test can be used) for the  duration of the COVID-19 declaration under Section 564(b)(1) of the Act, 21 U.S.C. section 360bbb-3(b)(1), unless the authorization is terminated or revoked sooner. Performed at [facility], [address] [place], [place] [address]    Ct Abdomen Pelvis Wo Contrast  Result Date: 12/21/2018 CLINICAL DATA:  Diffuse abdominal pain and diarrhea. EXAM: CT ABDOMEN AND PELVIS WITHOUT CONTRAST TECHNIQUE: Multidetector CT imaging of the abdomen and pelvis was performed following the standard protocol without IV contrast. COMPARISON:  CT abdomen pelvis dated September 23, 2013. FINDINGS: Lower chest: No acute abnormality. Right greater than left basilar atelectasis. Hepatobiliary: No focal liver abnormality. Distended gallbladder with layering sludge or small stones, wall thickening, and pericholecystic inflammatory changes. No biliary dilatation. Pancreas: Fatty atrophy. No ductal dilatation or surrounding inflammatory changes. Spleen: Normal in size without focal abnormality. Adrenals/Urinary Tract: The adrenal glands are unremarkable. Left greater than right renal parapelvic cysts. No renal or ureteral calculi. No hydronephrosis. The bladder is unremarkable. Stomach/Bowel: The stomach is within normal limits. No bowel wall thickening, distention, or surrounding inflammatory changes. Fluid in the right colon, consistent with history of diarrhea. Prior appendectomy. Vascular/Lymphatic: Aortic atherosclerosis. Mildly enlarged portacaval lymph node measuring 1.4 cm in short axis, similar to prior study, likely reactive. No additional enlarged abdominal or pelvic lymph nodes. Reproductive: Prostate is unremarkable. Other: Unchanged small fat containing bilateral inguinal and umbilical hernias. No free fluid or pneumoperitoneum. Musculoskeletal: No acute or significant osseous findings. IMPRESSION: 1. Acute cholecystitis. 2.  Aortic atherosclerosis (ICD10-I70.0). Electronically Signed   By: Obie DredgeWilliam T Derry M.D.    On: 12/21/2018 11:34   Ct Head Wo Contrast  Result Date: 12/21/2018 CLINICAL DATA:  Confusion. Elevated INR. History of dementia, recent urinary tract infection and right leg wound. EXAM: CT HEAD WITHOUT CONTRAST TECHNIQUE: Contiguous axial images were obtained from the base of the skull through the vertex without intravenous contrast. COMPARISON:  CT head 01/04/2011. FINDINGS: Brain: There is no evidence of acute intracranial hemorrhage, mass lesion, brain edema or extra-axial fluid collection. A large area of encephalomalacia is again noted within the left temporal, parietal and occipital lobes consistent with an old left MCA infarct. There is stable mild generalized atrophy with prominence of the ventricles and subarachnoid spaces. There are mild chronic small vessel ischemic changes in the periventricular white matter. There is no CT evidence of acute cortical infarction. Vascular: Prominent intracranial vascular calcifications. No hyperdense vessel identified. Skull: Negative for fracture or focal lesion. Sinuses/Orbits: The visualized paranasal sinuses and mastoid air cells are clear. No orbital abnormalities are seen. Other: None. IMPRESSION: No acute intracranial findings identified. Stable sequela of remote large left MCA infarct and mild generalized atrophy. Electronically Signed   By: Carey BullocksWilliam  Veazey M.D.   On: 12/21/2018 11:27   Dg Chest Port 1 View  Result Date: 12/21/2018 CLINICAL DATA:  Wound on right leg.  UTI. EXAM: PORTABLE CHEST 1 VIEW COMPARISON:  August 18, 2013 FINDINGS: Healed left rib fractures. No pneumothorax. Some sort of electronic device overlies the patient's lower left chest obscuring the costophrenic angle. Within this limitation, no other significant abnormalities identified. Mild atelectasis in the medial right lung base. IMPRESSION: No active disease. Electronically  Signed   By: Dorise Bullion III M.D   On: 12/21/2018 10:32    Pending Labs Unresulted Labs (From  admission, onward)    Start     Ordered   12/21/18 1024  C difficile quick scan w PCR reflex  (C Difficile quick screen w PCR reflex panel)  Once, for 24 hours,   STAT     12/21/18 1023   12/21/18 1024  Gastrointestinal Panel by PCR , Stool  (Gastrointestinal Panel by PCR, Stool)  Once,   STAT     12/21/18 1023   12/21/18 0953  Urine culture  ONCE - STAT,   STAT     12/21/18 0952   12/21/18 0952  Lactic acid, plasma  Now then every 2 hours,   STAT     12/21/18 0952   12/21/18 0952  Blood Culture (routine x 2)  BLOOD CULTURE X 2,   STAT     12/21/18 8341   Signed and Held  Creatinine, serum  (heparin)  Once,   R    Comments: Baseline for heparin therapy IF NOT ALREADY DRAWN.    Signed and Held   Signed and Held  Basic metabolic panel  Tomorrow morning,   R     Signed and Held   Signed and Held  CBC  Tomorrow morning,   R     Signed and Held   Signed and Held  Protime-INR  Tomorrow morning,   R     Signed and Held          Vitals/Pain Today's Vitals   12/21/18 1230 12/21/18 1315 12/21/18 1428 12/21/18 1445  BP: 102/62 106/73  91/66  Pulse:   (!) 115 (!) 119  Resp: (!) 29 (!) 29 (!) 26 (!) 26  Temp:      TempSrc:      SpO2:   95% 97%  Weight:      Height:        Isolation Precautions Enteric precautions (UV disinfection)  Medications Medications  piperacillin-tazobactam (ZOSYN) IVPB 3.375 g (has no administration in time range)  sodium chloride 0.9 % bolus 1,000 mL (0 mLs Intravenous Stopped 12/21/18 1223)  metroNIDAZOLE (FLAGYL) tablet 500 mg (500 mg Oral Given 12/21/18 1347)  piperacillin-tazobactam (ZOSYN) IVPB 3.375 g (0 g Intravenous Stopped 12/21/18 1402)  phytonadione (VITAMIN K) 5 mg in dextrose 5 % 50 mL IVPB (5 mg Intravenous New Bag/Given 12/21/18 1342)  sodium chloride 0.9 % bolus 500 mL (500 mLs Intravenous New Bag/Given 12/21/18 1338)    Mobility walks with device High fall risk   Focused Assessments Cardiac Assessment Handoff:    Lab Results   Component Value Date   CKTOTAL 63 12/31/2010   CKMB 1.5 08/19/2013   TROPONINI <0.03 12/21/2018   Lab Results  Component Value Date   DDIMER  09/25/2008    <0.22        AT THE INHOUSE ESTABLISHED CUTOFF VALUE OF 0.48 ug/mL FEU, THIS ASSAY HAS BEEN DOCUMENTED IN THE LITERATURE TO HAVE A SENSITIVITY AND NEGATIVE PREDICTIVE VALUE OF AT LEAST 98 TO 99%.  THE TEST RESULT SHOULD BE CORRELATED WITH AN ASSESSMENT OF THE CLINICAL PROBABILITY OF DVT / VTE.   Does the Patient currently have chest pain? No     R Recommendations: See Admitting Provider Note  Report given to:   Additional Notes:  Pt daughter is healthcare POA and has been at bedside with pt

## 2018-12-21 NOTE — ED Provider Notes (Addendum)
[facility] Emergency Department Provider Note  ____________________________________________   I have reviewed the triage vital signs and the nursing notes. Where available I have reviewed prior notes and, if possible and indicated, outside hospital notes.    HISTORY  Chief Complaint Code Sepsis    HPI Darrell Hayes is a 78 y.o. male  Who has a history of multiple medical problems, see below I have reviewed, on Coumadin chronically, history of appendectomy, presents today with his daughter.  History is somewhat limited from patient himself, he has been a little bit confused today his daughter states.  Although at baseline he may not be able to give a very good history.  In any event, patient was on before acting up antibiotics for UTI, initially Augmentin and then Bactrim which he ended late last week.  He began to have decreased p.o. starting on Monday, drinking well all this time but decreased solid food, began to complain of abdominal pain a couple days ago, and started having watery diarrhea yesterday.  No melena no bright red blood per rectum.  Decreased urine output per daughter although patient does sometimes wear diapers in the diaper sometimes of diarrhea him.  She states that it is hard for him to do both at once.  In any event, he seemed more and more weak and they elected to bring him in.  No recent falls he did fall 3 weeks ago has a rug burn/abrasion to the right medial knee which is been treated by physicians as an outpatient.  He has had copious watery diarrhea over the last couple days and is been complaining of abdominal pain off and on for the last day or 2 as well.  Patient often likes to hide his symptoms because he does like to come here.  He has not actually been to this hospital in 8 years and his daughter states that they try to keep him out of all hospitals.  He does have a seizure disorder, is not taking any medications for that because he has not  seized in many years.  He does have a history of atrial fibrillation, he has a history of aphasia.  Anxiety and multiple other medical problems.  No fever at home fortunately no exposure to coronavirus    Past Medical History:  Diagnosis Date  . Agitation    and irritability, situational exacerbation  . Alveolar/parietoalveolar pneumonopathy, other    06/14/09 CT chest, Mild LLL GGO; 02/28/10 CT chest , improved  . Atrial fibrillation (HCC)    Coumadin therapy - Dr. Jens Som.  Cardioversion 08/23/09, Amiodarone Rx since 09/03/2009  . CAD (coronary artery disease) 09/2008   Dr. Jens Som, stent to LAD  . CVA (cerebral vascular accident) (HCC) 2007   In the setting of atrial fibrillation  . Dyspnea 09/2008   Class 3, since 2008.  Significant tachycardia.  No PE or fibrosis on CT chest 3/10, 12/10, 02/2010, normal PFT but isolated low DLCO  06/2009, normal hgb, TSH, creat and albumin , Dec 2010.  Resolved after A Fib cardioversion and Amiodarone Marh 2011  . Hepatitis   . Hiatal hernia   . History of chickenpox   . Hyperlipidemia   . Hypertension   . Nonspecific abnormal results of pulmonary system function study   . Pulmonary nodules 09/2008   Nonspecific reticulonodular nodules RLL superior segment, resolved on CT 06/14/09  . Seizures (HCC) 12/2008   Dr. Sharene Skeans    Patient Active Problem List   Diagnosis Date  Noted  . ARF (acute renal failure) (HCC) 01/05/2011  . Altered mental status 12/23/2010  . BACK PAIN 09/15/2010  . PRESSURE ULCER BUTTOCK 07/31/2010  . CEREBROVASCULAR DISEASE 06/12/2010  . DEPRESSION 04/08/2010  . HEPATITIS 04/08/2010  . SEIZURE DISORDER 04/08/2010  . Personal history of unspecified circulatory disease 04/08/2010  . UTI'S, HX OF 04/08/2010  . CHICKENPOX, HX OF 04/08/2010  . OTHER AND UNSPECIFIED HYPERLIPIDEMIA 06/14/2009  . UNSPEC ALVEOLAR&PARIETOALVEOLAR PNEUMONOPATHY 06/14/2009  . ABNORMAL PULMONARY TEST RESULTS 06/14/2009  . PULMONARY NODULE 05/28/2009   . HYPOPOTASSEMIA 05/08/2009  . CAD 04/30/2009  . Nonspecific (abnormal) findings on radiological and other examination of body structure 04/30/2009  . COMPUTERIZED TOMOGRAPHY, CHEST, ABNORMAL 04/30/2009  . CHEST PAIN 09/25/2008  . HYPERCHOLESTEROLEMIA, PURE 09/11/2008  . Shortness of breath 09/11/2008  . HYPERTENSION 09/10/2008  . ATRIAL FIBRILLATION 09/10/2008  . CVA 09/10/2008    Past Surgical History:  Procedure Laterality Date  . APPENDECTOMY    . Stent placed  09/2008    Prior to Admission medications   Medication Sig Start Date End Date Taking? Authorizing Provider  amiodarone (PACERONE) 200 MG tablet Take 1 tablet (200 mg total) by mouth daily. 04/14/11   Joaquim Namuncan, Graham S, MD  aspirin 81 MG tablet Take 81 mg by mouth daily.      [provider]  carbamazepine (TEGRETOL) 200 MG tablet Take 1 1/2 tabs by mouth twice daily 04/14/11   Joaquim Namuncan, Graham S, MD  cholecalciferol (VITAMIN D) 1000 UNITS tablet Take 1,000 Units by mouth daily.      [provider]  docusate sodium (COLACE) 100 MG capsule Take 200 mg by mouth daily.      [provider]  fish oil-omega-3 fatty acids 1000 MG capsule Take 2 g by mouth daily.      [provider]  levothyroxine (LEVOTHROID) 25 MCG tablet Take 1 tablet (25 mcg total) by mouth daily. 04/14/11 04/13/12  Joaquim Namuncan, Graham S, MD  LORazepam (ATIVAN) 0.5 MG tablet Take one half tablet by mouth daily. 04/14/11   Joaquim Namuncan, Graham S, MD  metoprolol tartrate (LOPRESSOR) 25 MG tablet Take 0.5 tablets (12.5 mg total) by mouth daily. 04/14/11   Joaquim Namuncan, Graham S, MD  QUEtiapine (SEROQUEL) 25 MG tablet Take 1 tablet (25 mg total) by mouth 2 (two) times daily. 04/14/11   Joaquim Namuncan, Graham S, MD  rosuvastatin (CRESTOR) 20 MG tablet Take 1 tablet (20 mg total) by mouth daily. 04/14/11   Joaquim Namuncan, Graham S, MD  sertraline (ZOLOFT) 25 MG tablet Take 1 tablet (25 mg total) by mouth daily. 04/14/11   Joaquim Namuncan, Graham S, MD  sorbitol 70 % solution Take  2 tablespoonfuls every two hours as needed.     [provider]  warfarin (COUMADIN) 1 MG tablet Take 3 and a half tablets by mouth daily (or as directed) 04/14/11   Joaquim Namuncan, Graham S, MD    Allergies Patient has no known allergies.  Family History  Problem Relation Age of Onset  . Hyperlipidemia Mother   . Hypertension Mother   . Arthritis Mother        osteoarthritis  . Stroke Father   . Heart disease Other        Family history of heart disease    Social History Social History   Tobacco Use  . Smoking status: Former Smoker    Years: 32.00    Types: Cigarettes, Pipe    Quit date: 11/27/2006    Years since quitting: 12.0  . Smokeless  tobacco: Never Used  Substance Use Topics  . Alcohol use: No  . Drug use: Not on file    Review of Systems Constitutional: No fever/chills Eyes: No visual changes. ENT: No sore throat. No stiff neck no neck pain Cardiovascular: Denies chest pain. Respiratory: Denies shortness of breath. Gastrointestinal:   no vomiting.  + diarrhea.  No constipation. Genitourinary: Negative for dysuria. Musculoskeletal: Negative lower extremity swelling Skin: Negative for rash. Neurological: Negative for severe headaches, focal weakness or numbness.   ____________________________________________   PHYSICAL EXAM:  VITAL SIGNS: ED Triage Vitals  Enc Vitals Group     BP 12/21/18 1007 (!) 81/50     Pulse Rate 12/21/18 1003 (!) 127     Resp 12/21/18 1003 (!) 30     Temp 12/21/18 1003 98.7 F (37.1 C)     Temp Source 12/21/18 1003 Oral     SpO2 --      Weight 12/21/18 0954 250 lb (113.4 kg)     Height 12/21/18 0954 5\' 8"  (1.727 m)     Head Circumference --      Peak Flow --      Pain Score --      Pain Loc --      Pain Edu? --      Excl. in GC? --     Constitutional: Alert and oriented to name and place unsure of the date.  No acute distress. Eyes: Conjunctivae are normal Head: Atraumatic HEENT: No congestion/rhinnorhea. Mucous  membranes are lightly dry.  Oropharynx non-erythematous Neck:   Nontender with no meningismus, no masses, no stridor Cardiovascular: Newly irregular, little bit rapid at 110. Grossly normal heart sounds.  Good peripheral circulation. Respiratory: Normal respiratory effort.  No retractions. Lungs CTAB. Abdominal: Soft and loosely tender voluntary guarding no rebound. No distention. Back:  There is no focal tenderness or step off.  there is no midline tenderness there are no lesions noted. there is no CVA tenderness Musculoskeletal: No lower extremity tenderness, no upper extremity tenderness. No joint effusions, no DVT signs strong distal pulses no edema Neurologic:  Normal speech and language. No gross focal neurologic deficits are appreciated.  Skin:  Skin is warm, dry and intact.  There is a open wound to the medial right knee, does not appear to have a large amount of surrounding cellulitis although there is some mild redness around it.  Granulomatous tissue was noted.  It is well dressed.  I can range the knee with no tenderness. Psychiatric: Mood and affect are normal. Speech and behavior are normal.  ____________________________________________   LABS (all labs ordered are listed, but only abnormal results are displayed)  Labs Reviewed  CBC WITH DIFFERENTIAL/PLATELET - Abnormal; Notable for the following components:      Result Value   WBC 19.8 (*)    RBC 3.26 (*)    Hemoglobin 10.5 (*)    HCT 31.6 (*)    Neutro Abs 16.0 (*)    Monocytes Absolute 2.3 (*)    Abs Immature Granulocytes 0.24 (*)    All other components within normal limits  CULTURE, BLOOD (ROUTINE X 2)  CULTURE, BLOOD (ROUTINE X 2)  URINE CULTURE  SARS CORONAVIRUS 2 (HOSPITAL ORDER, PERFORMED IN  HOSPITAL LAB)  C DIFFICILE QUICK SCREEN W PCR REFLEX  GASTROINTESTINAL PANEL BY PCR, STOOL (REPLACES STOOL CULTURE)  LACTIC ACID, PLASMA  LACTIC ACID, PLASMA  COMPREHENSIVE METABOLIC PANEL  URINALYSIS,  ROUTINE W REFLEX MICROSCOPIC  URINALYSIS, COMPLETE (UACMP) WITH MICROSCOPIC  PROTIME-INR  TROPONIN I  TSH    Pertinent labs  results that were available during my care of the patient were reviewed by me and considered in my medical decision making (see chart for details). ____________________________________________  EKG  I personally interpreted any EKGs ordered by me or triage Atrial fibrillation, rate 131 normal axis, no acute ischemic changes ____________________________________________  RADIOLOGY  Pertinent labs & imaging results that were available during my care of the patient were reviewed by me and considered in my medical decision making (see chart for details). If possible, patient and/or family made aware of any abnormal findings.  No results found. ____________________________________________    PROCEDURES  Procedure(s) performed: None  Procedures  Critical Care performed: CRITICAL CARE Performed by: Jeanmarie PlantJAMES A Asyia Hornung   Total critical care time: 55 minutes  Critical care time was exclusive of separately billable procedures and treating other patients.  Critical care was necessary to treat or prevent imminent or life-threatening deterioration.  Critical care was time spent personally by me on the following activities: development of treatment plan with patient and/or surrogate as well as nursing, discussions with consultants, evaluation of patient's response to treatment, examination of patient, obtaining history from patient or surrogate, ordering and performing treatments and interventions, ordering and review of laboratory studies, ordering and review of radiographic studies, pulse oximetry and re-evaluation of patient's condition.   ____________________________________________   INITIAL IMPRESSION / ASSESSMENT AND PLAN / ED COURSE  Pertinent labs & imaging results that were available during my care of the patient were reviewed by me and considered in my  medical decision making (see chart for details).  Patient here with abdominal pain, diarrhea after antibiotics, tachycardia in the context of atrial fibrillation, decreased p.o., afebrile, differential includes intra-abdominal pathology such as ischemia, also considered C. difficile.  Given that all of this began with watery diarrhea after antibiotics I will hold off on antibiotics for a few minutes while we figure this out.  We will give him IV fluid as he has no history of CHF and is not any fluid pills, we will obtain CT scan of his abdomen.  Chest x-ray has been obtained.  We are obtaining cultures, and diffuse work-up is initiated.  Daughter is at bedside and kept abreast of our plan.  ----------------------------------------- 11:45 AM on 12/21/2018 -----------------------------------------  Resting comfortably in the bed, does not complain of abdominal pain at this time but when I touch his abdomen he states that it hurts.  He has more right upper quadrant abdominal pain anywhere else CT scan shows probably cholecystitis.  Therefore I will advance antibiotics initially gave possible empiric C. difficile coverage but again given his CT findings we will give him Zosyn he has not been able to give us a stool sample.  Clearly it is difficult to know exactly which direction things to go.  Not have surgical findings on his exam however.  I will obtain a formal ultrasound.  Patient would not be a candidate for emergent surgery given his high INR.  Kept abreast of findings we will admit.  ----------------------------------------- 12:13 PM on 12/21/2018 -----------------------------------------  D/w dr. Aleen CampiPiscoya, who agrees w mgt and hospitalist admission. D/w dr. Imogene Burnhen who will admit and agrees w mgt.    ____________________________________________   FINAL CLINICAL IMPRESSION(S) / ED DIAGNOSES  Final diagnoses:  None      This chart was dictated using voice recognition software.  Despite  best efforts to proofread,  errors can occur which can change meaning.  Schuyler Amor, MD 12/21/18 1031    Schuyler Amor, MD 12/21/18 1148    Schuyler Amor, MD 12/21/18 1214    Schuyler Amor, MD 12/21/18 1217

## 2018-12-21 NOTE — ED Notes (Signed)
Attempted to call report- was told they were going to speak with Dr Sheppard Evens to have room assignment changed

## 2018-12-21 NOTE — TOC Initial Note (Signed)
Transition of Care [facility]) - Initial/Assessment Note    Patient Details  Name: Darrell Hayes MRN: [id number] Date of Birth: 1941-01-31  [facility]) CM/SW Contact:    Marshell Garfinkel, RN Phone Number: 12/21/2018, 2:19 PM  Clinical Narrative:                  Payer listed at [facility]. EDRNCM checked with Peri Jefferson and he was closed with hospice services in 2018.  RNCM has notified [employer] for update to payer.      Patient Goals and CMS Choice        Expected Discharge Plan and Services                                                Prior Living Arrangements/Services                       Activities of Daily Living      Permission Sought/Granted                  Emotional Assessment              Admission diagnosis:  sepsis alert Patient Active Problem List   Diagnosis Date Noted  . ARF (acute renal failure) ([place]) 01/05/2011  . Altered mental status 12/23/2010  . BACK PAIN 09/15/2010  . PRESSURE ULCER BUTTOCK 07/31/2010  . CEREBROVASCULAR DISEASE 06/12/2010  . DEPRESSION 04/08/2010  . HEPATITIS 04/08/2010  . SEIZURE DISORDER 04/08/2010  . Personal history of unspecified circulatory disease 04/08/2010  . UTI'S, HX OF 04/08/2010  . CHICKENPOX, HX OF 04/08/2010  . OTHER AND UNSPECIFIED HYPERLIPIDEMIA 06/14/2009  . UNSPEC ALVEOLAR&PARIETOALVEOLAR PNEUMONOPATHY 06/14/2009  . ABNORMAL PULMONARY TEST RESULTS 06/14/2009  . PULMONARY NODULE 05/28/2009  . HYPOPOTASSEMIA 05/08/2009  . CAD 04/30/2009  . Nonspecific (abnormal) findings on radiological and other examination of body structure 04/30/2009  . COMPUTERIZED TOMOGRAPHY, CHEST, ABNORMAL 04/30/2009  . CHEST PAIN 09/25/2008  . HYPERCHOLESTEROLEMIA, PURE 09/11/2008  . Shortness of breath 09/11/2008  . HYPERTENSION 09/10/2008  . ATRIAL FIBRILLATION 09/10/2008  . CVA 09/10/2008   PCP:  Janna Arch, MD Pharmacy:   [facility] [phone], [place] [address] [place] [address] Phone: [phone] Fax: [phone]     Social Determinants of Health (SDOH) Interventions    Readmission Risk Interventions No flowsheet data found.

## 2018-12-21 NOTE — ED Notes (Signed)
Report given to Abigail, RN

## 2018-12-21 NOTE — ED Notes (Signed)
Ultrasound at bedside

## 2018-12-21 NOTE — ED Notes (Signed)
Dr Burlene Arnt at bedside- stomach tenderness on palpation noted

## 2018-12-21 NOTE — ED Notes (Signed)
Bladder scan redone- shows 26ml
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
## 2018-12-21 NOTE — H&P (Addendum)
[facility] - [place] [facility]   PATIENT NAME: Darrell Hayes    MR#:  [id number]  DATE OF BIRTH:  06-20-1941  DATE OF ADMISSION:  12/21/2018  PRIMARY CARE PHYSICIAN: Unice Baileyosenberg, Beth S, MD   REQUESTING/REFERRING PHYSICIAN: Dr. Alphonzo LemmingsMcShane.  CHIEF COMPLAINT:   Chief Complaint  Patient presents with  . Code Sepsis   Abdominal pain. HISTORY OF PRESENT ILLNESS:  Darrell Hayes  is a 78 y.o. male with a known history of CAD, CVA, hypertension, hyperlipidemia, seizure disorder and A. fib etc.  The patient is sent to ED due to above chief complaints.  He is a very poor historian.  According to her daughter, the patient is confused at baseline.  He has been on antibiotics Augmentin and Bactrim for UTI last week.  He complains of abdominal pain a couple days ago and then start watery diarrhea yesterday.  He has no melena or bloody stool.  In ED, he is found tachycardia, A. fib with RVR.  Hypotension with blood pressure 80s.  He also has a renal failure with dehydration.  CT scan of abdomen report acute cholecystitis.  He is treated with antibiotics in the ED.  The patient has supratherapeutic INR.  He cannot get surgery at this time per surgeon.  Dr. Alphonzo LemmingsMcShane requested admission to medical service. PAST MEDICAL HISTORY:   Past Medical History:  Diagnosis Date  . Agitation    and irritability, situational exacerbation  . Alveolar/parietoalveolar pneumonopathy, other    06/14/09 CT chest, Mild LLL GGO; 02/28/10 CT chest , improved  . Atrial fibrillation (HCC)    Coumadin therapy - Dr. Jens Somrenshaw.  Cardioversion 08/23/09, Amiodarone Rx since 09/03/2009  . CAD (coronary artery disease) 09/2008   Dr. Jens Somrenshaw, stent to LAD  . CVA (cerebral vascular accident) (HCC) 2007   In the setting of atrial fibrillation  . Dyspnea 09/2008   Class 3, since 2008.  Significant tachycardia.  No PE or fibrosis on CT chest 3/10, 12/10, 02/2010, normal PFT but isolated low DLCO  06/2009, normal hgb, TSH,  creat and albumin , Dec 2010.  Resolved after A Fib cardioversion and Amiodarone Marh 2011  . Hepatitis   . Hiatal hernia   . History of chickenpox   . Hyperlipidemia   . Hypertension   . Nonspecific abnormal results of pulmonary system function study   . Pulmonary nodules 09/2008   Nonspecific reticulonodular nodules RLL superior segment, resolved on CT 06/14/09  . Seizures (HCC) 12/2008   Dr. Sharene SkeansHickling    PAST SURGICAL HISTORY:   Past Surgical History:  Procedure Laterality Date  . APPENDECTOMY    . Stent placed  09/2008    SOCIAL HISTORY:   Social History   Tobacco Use  . Smoking status: Former Smoker    Years: 32.00    Types: Cigarettes, Pipe    Quit date: 11/27/2006    Years since quitting: 12.0  . Smokeless tobacco: Never Used  Substance Use Topics  . Alcohol use: No    FAMILY HISTORY:   Family History  Problem Relation Age of Onset  . Hyperlipidemia Mother   . Hypertension Mother   . Arthritis Mother        osteoarthritis  . Stroke Father   . Heart disease Other        Family history of heart disease    DRUG ALLERGIES:  No Known Allergies  REVIEW OF SYSTEMS:   Review of Systems  Unable to perform ROS: Dementia  MEDICATIONS AT HOME:   Prior to Admission medications   Medication Sig Start Date End Date Taking? Authorizing Provider  albuterol (PROVENTIL) (2.5 MG/3ML) 0.083% nebulizer solution Take 2.5 mg by nebulization every 4 (four) hours as needed for wheezing or shortness of breath.   Yes [provider]  cholecalciferol (VITAMIN D) 1000 UNITS tablet Take 1,000 Units by mouth daily.     Yes [provider]  levothyroxine (SYNTHROID) 50 MCG tablet Take 50 mcg by mouth every evening.   Yes [provider]  lisinopril (ZESTRIL) 2.5 MG tablet Take 2.5 mg by mouth at bedtime.   Yes [provider]  metoprolol tartrate (LOPRESSOR) 25 MG tablet Take 0.5 tablets (12.5 mg total) by mouth daily. 04/14/11  Yes Joaquim Namuncan,  Graham S, MD  rosuvastatin (CRESTOR) 20 MG tablet Take 1 tablet (20 mg total) by mouth daily. Patient taking differently: Take 10 mg by mouth every evening.  04/14/11  Yes Joaquim Namuncan, Graham S, MD  tamsulosin (FLOMAX) 0.4 MG CAPS capsule Take 0.8 mg by mouth at bedtime.   Yes [provider]  warfarin (COUMADIN) 1 MG tablet Take 3 and a half tablets by mouth daily (or as directed) Patient taking differently: Take 3-4 mg by mouth daily at 6 PM. Take 3 mg by mouth on Sunday, Tuesday, Thursday, Friday and Saturday. Take 4 mg on Monday and Wednesday 04/14/11  Yes Joaquim Namuncan, Graham S, MD  amiodarone (PACERONE) 200 MG tablet Take 1 tablet (200 mg total) by mouth daily. Patient not taking: Reported on 12/21/2018 04/14/11   Joaquim Namuncan, Graham S, MD  carbamazepine (TEGRETOL) 200 MG tablet Take 1 1/2 tabs by mouth twice daily Patient not taking: Reported on 12/21/2018 04/14/11   Joaquim Namuncan, Graham S, MD  docusate sodium (COLACE) 100 MG capsule Take 200 mg by mouth daily.      [provider]  levothyroxine (LEVOTHROID) 25 MCG tablet Take 1 tablet (25 mcg total) by mouth daily. 04/14/11 04/13/12  Joaquim Namuncan, Graham S, MD  LORazepam (ATIVAN) 0.5 MG tablet Take one half tablet by mouth daily. Patient not taking: Reported on 12/21/2018 04/14/11   Joaquim Namuncan, Graham S, MD  QUEtiapine (SEROQUEL) 25 MG tablet Take 1 tablet (25 mg total) by mouth 2 (two) times daily. Patient not taking: Reported on 12/21/2018 04/14/11   Joaquim Namuncan, Graham S, MD  sertraline (ZOLOFT) 25 MG tablet Take 1 tablet (25 mg total) by mouth daily. Patient not taking: Reported on 12/21/2018 04/14/11   Joaquim Namuncan, Graham S, MD  sorbitol 70 % solution Take 2 tablespoonfuls every two hours as needed.     [provider]      VITAL SIGNS:  Blood pressure 91/66, pulse (!) 119, temperature 98.7 F (37.1 C), temperature source Oral, resp. rate (!) 26, height 5\' 8"  (1.727 m), weight 113.4 kg, SpO2 97 %.  PHYSICAL EXAMINATION:  Physical Exam  GENERAL:  78  y.o.-year-old patient lying in the bed with no acute distress.  Obesity. EYES: Pupils equal, round, reactive to light and accommodation. No scleral icterus. Extraocular muscles intact.  HEENT: Head atraumatic, normocephalic. Oropharynx and nasopharynx clear.  NECK:  Supple, no jugular venous distention. No thyroid enlargement, no tenderness.  LUNGS: Normal breath sounds bilaterally, no wheezing, rales,rhonchi or crepitation. No use of accessory muscles of respiration.  CARDIOVASCULAR: S1, S2 normal. No murmurs, rubs, or gallops.  ABDOMEN: Soft, tenderness on RUQ, nondistended. Bowel sounds present. No organomegaly or mass.  EXTREMITIES: No pedal edema, cyanosis, or clubbing.  NEUROLOGIC: Cranial nerves II through XII  are intact. Muscle strength 4/5 in all extremities. Sensation intact. Gait not checked.  PSYCHIATRIC: The patient is alert and oriented x 1-2.  SKIN: No obvious rash, lesion, or ulcer.   LABORATORY PANEL:   CBC Recent Labs  Lab 12/21/18 0956  WBC 19.8*  HGB 10.5*  HCT 31.6*  PLT 345   ------------------------------------------------------------------------------------------------------------------  Chemistries  Recent Labs  Lab 12/21/18 0956  NA 133*  K 3.9  CL 97*  CO2 20*  GLUCOSE 170*  BUN 56*  CREATININE 3.67*  CALCIUM 8.6*  AST 72*  ALT 42  ALKPHOS 84  BILITOT 1.0   ------------------------------------------------------------------------------------------------------------------  Cardiac Enzymes Recent Labs  Lab 12/21/18 0956  TROPONINI <0.03   ------------------------------------------------------------------------------------------------------------------  RADIOLOGY:  Ct Abdomen Pelvis Wo Contrast  Result Date: 12/21/2018 CLINICAL DATA:  Diffuse abdominal pain and diarrhea. EXAM: CT ABDOMEN AND PELVIS WITHOUT CONTRAST TECHNIQUE: Multidetector CT imaging of the abdomen and pelvis was performed following the standard protocol without IV  contrast. COMPARISON:  CT abdomen pelvis dated September 23, 2013. FINDINGS: Lower chest: No acute abnormality. Right greater than left basilar atelectasis. Hepatobiliary: No focal liver abnormality. Distended gallbladder with layering sludge or small stones, wall thickening, and pericholecystic inflammatory changes. No biliary dilatation. Pancreas: Fatty atrophy. No ductal dilatation or surrounding inflammatory changes. Spleen: Normal in size without focal abnormality. Adrenals/Urinary Tract: The adrenal glands are unremarkable. Left greater than right renal parapelvic cysts. No renal or ureteral calculi. No hydronephrosis. The bladder is unremarkable. Stomach/Bowel: The stomach is within normal limits. No bowel wall thickening, distention, or surrounding inflammatory changes. Fluid in the right colon, consistent with history of diarrhea. Prior appendectomy. Vascular/Lymphatic: Aortic atherosclerosis. Mildly enlarged portacaval lymph node measuring 1.4 cm in short axis, similar to prior study, likely reactive. No additional enlarged abdominal or pelvic lymph nodes. Reproductive: Prostate is unremarkable. Other: Unchanged small fat containing bilateral inguinal and umbilical hernias. No free fluid or pneumoperitoneum. Musculoskeletal: No acute or significant osseous findings. IMPRESSION: 1. Acute cholecystitis. 2.  Aortic atherosclerosis (ICD10-I70.0). Electronically Signed   By: Obie DredgeWilliam T Derry M.D.   On: 12/21/2018 11:34   Ct Head Wo Contrast  Result Date: 12/21/2018 CLINICAL DATA:  Confusion. Elevated INR. History of dementia, recent urinary tract infection and right leg wound. EXAM: CT HEAD WITHOUT CONTRAST TECHNIQUE: Contiguous axial images were obtained from the base of the skull through the vertex without intravenous contrast. COMPARISON:  CT head 01/04/2011. FINDINGS: Brain: There is no evidence of acute intracranial hemorrhage, mass lesion, brain edema or extra-axial fluid collection. A large area of  encephalomalacia is again noted within the left temporal, parietal and occipital lobes consistent with an old left MCA infarct. There is stable mild generalized atrophy with prominence of the ventricles and subarachnoid spaces. There are mild chronic small vessel ischemic changes in the periventricular white matter. There is no CT evidence of acute cortical infarction. Vascular: Prominent intracranial vascular calcifications. No hyperdense vessel identified. Skull: Negative for fracture or focal lesion. Sinuses/Orbits: The visualized paranasal sinuses and mastoid air cells are clear. No orbital abnormalities are seen. Other: None. IMPRESSION: No acute intracranial findings identified. Stable sequela of remote large left MCA infarct and mild generalized atrophy. Electronically Signed   By: Carey BullocksWilliam  Veazey M.D.   On: 12/21/2018 11:27   Dg Chest Port 1 View  Result Date: 12/21/2018 CLINICAL DATA:  Wound on right leg.  UTI. EXAM: PORTABLE CHEST 1 VIEW COMPARISON:  August 18, 2013 FINDINGS: Healed left rib fractures. No pneumothorax. Some sort of electronic device overlies  the patient's lower left chest obscuring the costophrenic angle. Within this limitation, no other significant abnormalities identified. Mild atelectasis in the medial right lung base. IMPRESSION: No active disease. Electronically Signed   By: Dorise Bullion III M.D   On: 12/21/2018 10:32      IMPRESSION AND PLAN:   Sepsis due to acute cholecystitis and UTI. The patient will be admitted to medical floor. Continue antibiotics, follow-up CBC and cultures.  Follow-up general surgery consult.  Hypotension due to renal failure dehydration.  Normal saline bolus. Hold lisinopril and hypertension medication, IV fluid support, follow-up BMP.  Lactic acidosis due to above.  Follow-up lactate level Supratherapeutic INR. Hold Coumadin and follow-up INR.  A. fib with RVR.  Due to above.  Hold Lopressor due to hypotension.  Hold Coumadin for  possible surgery.  Cardiology consult.  All the records are reviewed and case discussed with ED provider. Management plans discussed with the patient, his daughter and they are in agreement.  CODE STATUS: Partial code.  TOTAL TIME TAKING CARE OF THIS PATIENT: 52 minutes.    Demetrios Loll M.D on 12/21/2018 at 3:20 PM  Between 7am to 6pm - Pager - [phone]  After 6pm go to [email or web address] - Proofreader  [facility]  Office  [phone]  CC: Primary care physician; Janna Arch, MD   Note: This dictation was prepared with Dragon dictation along with smaller phrase technology. Any transcriptional errors that result from this process are unin

## 2018-12-21 NOTE — ED Triage Notes (Signed)
Pt arrives via EMS after having a wound on his right leg, a UTI x2 weeks ago, hx of dementia, not urinating since yesterday at 2, and diarrhea- EMS called code sepsis- bp 87/59, afib rvr

## 2018-12-21 NOTE — ED Notes (Signed)
Lactic acid 2.4 per lab- will notify Dr Burlene Arnt

## 2018-12-21 NOTE — ED Notes (Signed)
Dr Burlene Arnt aware of pressure 81/50- VO given for fluid bolus

## 2018-12-21 NOTE — ED Notes (Signed)
Patient transported to CT 

## 2018-12-21 NOTE — ED Notes (Signed)
Pt still has not had episode of diarrhea to send for culture Pt requested to use urinal to void but was unable to

## 2018-12-21 NOTE — ED Notes (Signed)
Bladder scan done by EDT- 33ml shown

## 2018-12-22 ENCOUNTER — Inpatient Hospital Stay: Payer: Medicare (Managed Care)

## 2018-12-22 ENCOUNTER — Encounter: Payer: Self-pay | Admitting: Radiology

## 2018-12-22 DIAGNOSIS — I25118 Atherosclerotic heart disease of native coronary artery with other forms of angina pectoris: Secondary | ICD-10-CM

## 2018-12-22 DIAGNOSIS — I739 Peripheral vascular disease, unspecified: Secondary | ICD-10-CM

## 2018-12-22 DIAGNOSIS — R1011 Right upper quadrant pain: Secondary | ICD-10-CM

## 2018-12-22 DIAGNOSIS — K591 Functional diarrhea: Secondary | ICD-10-CM

## 2018-12-22 DIAGNOSIS — E860 Dehydration: Secondary | ICD-10-CM

## 2018-12-22 LAB — CBC
HCT: 28.3 % — ABNORMAL LOW (ref 39.0–52.0)
Hemoglobin: 9.4 g/dL — ABNORMAL LOW (ref 13.0–17.0)
MCH: 32.5 pg (ref 26.0–34.0)
MCHC: 33.2 g/dL (ref 30.0–36.0)
MCV: 97.9 fL (ref 80.0–100.0)
Platelets: 306 10*3/uL (ref 150–400)
RBC: 2.89 MIL/uL — ABNORMAL LOW (ref 4.22–5.81)
RDW: 14.4 % (ref 11.5–15.5)
WBC: 14.5 10*3/uL — ABNORMAL HIGH (ref 4.0–10.5)
nRBC: 0 % (ref 0.0–0.2)

## 2018-12-22 LAB — BASIC METABOLIC PANEL
Anion gap: 9 (ref 5–15)
BUN: 51 mg/dL — ABNORMAL HIGH (ref 8–23)
CO2: 18 mmol/L — ABNORMAL LOW (ref 22–32)
Calcium: 7.6 mg/dL — ABNORMAL LOW (ref 8.9–10.3)
Chloride: 110 mmol/L (ref 98–111)
Creatinine, Ser: 2.72 mg/dL — ABNORMAL HIGH (ref 0.61–1.24)
GFR calc Af Amer: 25 mL/min — ABNORMAL LOW (ref >60)
GFR calc non Af Amer: 21 mL/min — ABNORMAL LOW (ref >60)
Glucose, Bld: 117 mg/dL — ABNORMAL HIGH (ref 70–99)
Potassium: 4.2 mmol/L (ref 3.5–5.1)
Sodium: 137 mmol/L (ref 135–145)

## 2018-12-22 LAB — TROPONIN I
Troponin I: <0.03 ng/mL (ref <0.03)
Troponin I: <0.03 ng/mL (ref <0.03)
Troponin I: <0.03 ng/mL (ref <0.03)

## 2018-12-22 LAB — PROTIME-INR
INR: 1.4 — ABNORMAL HIGH (ref 0.8–1.2)
Prothrombin Time: 16.6 seconds — ABNORMAL HIGH (ref 11.4–15.2)

## 2018-12-22 LAB — MAGNESIUM: Magnesium: 2.5 mg/dL — ABNORMAL HIGH (ref 1.7–2.4)

## 2018-12-22 MED ORDER — DILTIAZEM HCL 25 MG/5ML IV SOLN
5.0000 mg | Freq: Once | INTRAVENOUS | Status: AC
  Administered 2018-12-22: 07:00:00 5 mg via INTRAVENOUS
  Filled 2018-12-22: qty 5

## 2018-12-22 MED ORDER — TECHNETIUM TC 99M MEBROFENIN IV KIT
5.0000 | PACK | Freq: Once | INTRAVENOUS | Status: AC | PRN
  Administered 2018-12-22: 14:00:00 5.091 via INTRAVENOUS

## 2018-12-22 MED ORDER — TECHNETIUM TC 99M MEBROFENIN IV KIT
2.1000 | PACK | Freq: Once | INTRAVENOUS | Status: AC | PRN
  Administered 2018-12-22: 2.1 via INTRAVENOUS

## 2018-12-22 MED ORDER — SODIUM CHLORIDE 0.9 % IV BOLUS
500.0000 mL | Freq: Once | INTRAVENOUS | Status: AC
  Administered 2018-12-22: 500 mL via INTRAVENOUS

## 2018-12-22 MED ORDER — SODIUM CHLORIDE 0.9 % IV SOLN
INTRAVENOUS | Status: DC | PRN
  Administered 2018-12-22 – 2018-12-23 (×2): 250 mL via INTRAVENOUS
  Administered 2018-12-25 – 2018-12-26 (×2): 500 mL via INTRAVENOUS

## 2018-12-22 MED ORDER — DIGOXIN 0.25 MG/ML IJ SOLN
0.2500 mg | Freq: Once | INTRAMUSCULAR | Status: AC
  Administered 2018-12-22: 0.25 mg via INTRAVENOUS

## 2018-12-22 MED ORDER — HYDROGEN PEROXIDE 3 % EX SOLN
Freq: Two times a day (BID) | CUTANEOUS | Status: DC
  Administered 2018-12-22 – 2018-12-25 (×6): via TOPICAL
  Filled 2018-12-22: qty 473

## 2018-12-22 MED ORDER — METOPROLOL TARTRATE 25 MG PO TABS
25.0000 mg | ORAL_TABLET | Freq: Two times a day (BID) | ORAL | Status: DC
  Administered 2018-12-22 – 2018-12-26 (×8): 25 mg via ORAL
  Filled 2018-12-22 (×9): qty 1

## 2018-12-22 MED ORDER — DIGOXIN 0.25 MG/ML IJ SOLN
INTRAMUSCULAR | Status: AC
  Administered 2018-12-22: 0.25 mg via INTRAVENOUS
  Filled 2018-12-22: qty 2

## 2018-12-22 MED ORDER — SODIUM CHLORIDE 0.9 % IV BOLUS
250.0000 mL | Freq: Once | INTRAVENOUS | Status: AC
  Administered 2018-12-22: 250 mL via INTRAVENOUS

## 2018-12-22 MED ORDER — SODIUM CHLORIDE 0.9% FLUSH
3.0000 mL | Freq: Two times a day (BID) | INTRAVENOUS | Status: DC
  Administered 2018-12-22 – 2018-12-24 (×3): 3 mL via INTRAVENOUS

## 2018-12-22 MED ORDER — COLLAGENASE 250 UNIT/GM EX OINT
TOPICAL_OINTMENT | Freq: Every day | CUTANEOUS | Status: DC
  Administered 2018-12-22 – 2018-12-23 (×2): via TOPICAL
  Filled 2018-12-22: qty 30

## 2018-12-22 MED ORDER — SODIUM CHLORIDE 0.9% FLUSH
3.0000 mL | INTRAVENOUS | Status: DC | PRN
  Administered 2018-12-22 – 2018-12-26 (×2): 3 mL via INTRAVENOUS
  Filled 2018-12-22 (×2): qty 3

## 2018-12-22 NOTE — Consult Note (Addendum)
Cardiology Consultation:   Patient ID: Darrell Hayes MRN: [id number]; DOB: [birth year]  Admit date: 12/21/2018 Date of Consult: 12/22/2018  Primary Care Provider: Unice Bailey, MD Primary Cardiologist: New to [facility] in 2012 Physician requesting consult: Dr. Imogene Burn Reason for consult :atrial fibrillation with RVR    Patient Profile:   Darrell Hayes is a 78 y.o. 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 Imogene Burn.   History of Present Illness:   He presents to [facility] ED 12/21/2018 and noted to be a poor historian with daughter providing most of the history.    3 weeks ago with trauma to his right knee, large hematoma right leg This has been managed by [facility] Recent treatment for urinary tract infection, on antibiotics The past day or so with copious watery diarrhea over the last day or so  Also with abdominal discomfort, particularly right upper quadrant  Daughter wondering if he had a seizure, blankly staring off into the distance on one occasion Family reports tremendous weakness, 3-4 person assistance required  [facility] unable to take care of the patient and see him in the home and they were referred to the emergency room from their organization  In the ED, BP 81/50, HR 127 bpm, RR 30, T 98.38F.    WBC 19.8, anemia with hemoglobin 10.5   creatinine 3.67, BUN 56,  Troponin negative.  Lactic acid 2.4.   INR supratherapeutic at 5.6.    EKG with elevated heart rate Personally reviewed by myself showing atrial fibrillation ventricular rate 131 bpm early R wave progression no other significant ST-T wave changes noted  He  has received 1 dose digoxin 0.25 mg, diltiazem 5 mg IV push this morning Metoprolol 25 twice daily ordered Blood pressure running low 102 systolic over 60, but improved from the emergency room where pressures were in the 80s systolic Nurses report no further diarrhea this morning Continues on IV fluids at 100 cc/h normal saline  Other past medical history reviewed  09/18/2008 echo showed EF 55 to 60%,   likely in atrial flutter with 2-1 conduction versus an ectopic atrial tachycardia with ventricular rates up into the 140s   converting to sinus rhythm without intervention.  09/10/2008 carotid artery ultrasound showed critical stenosis in the proximal segment of the left ICA, 50 to 69% stenosis of the proximal segment of the right ICA.  09/26/2008:  cardiac catheterization with stent placement  90% stenosis in the LAD  80% stenosis in the distal LAD,  EF 55%.   PCI to the mid LAD lesion with bare-metal stent.  cardioversion 08/23/2009 but was back in Afib when seen 09/2009. Amiodarone was started with repeat cardioversion 10/21/2009.  By 06/2010, he was in atrial flutter but not significantly symptomatic  plan was for rate control and anticoagulation.  falls from weakness on the right side from his previous CVA.    hospital in 12/2010  UTI.   lost to follow-up after that time.   Past Medical History:  Diagnosis Date   Agitation    and irritability, situational exacerbation   Alveolar/parietoalveolar pneumonopathy, other    06/14/09 CT chest, Mild LLL GGO; 02/28/10 CT chest ,  improved   Atrial fibrillation (HCC)    Coumadin therapy - Dr. Jens [facility].  Cardioversion 08/23/09, Amiodarone Rx since 09/03/2009   CAD (coronary artery disease) 09/2008   Dr. Jens [facility], stent to LAD   CVA (cerebral vascular accident) (HCC) 2007   In the setting of atrial fibrillation   Dyspnea 09/2008   Class 3, since 2008.  Significant tachycardia.  No PE or fibrosis on CT chest 3/10, 12/10, 02/2010, normal PFT but  isolated low DLCO  06/2009, normal hgb, TSH, creat and albumin , Dec 2010.  Resolved after A Fib cardioversion and Amiodarone Marh 2011   Hepatitis    Hiatal hernia    History of chickenpox    Hyperlipidemia    Hypertension    Nonspecific abnormal results of pulmonary system function study    Pulmonary nodules 09/2008   Nonspecific reticulonodular nodules RLL superior segment, resolved on CT 06/14/09   Seizures (HCC) 12/2008   Dr. Sharene SkeansHickling    Past Surgical History:  Procedure Laterality Date   APPENDECTOMY     Stent placed  09/2008    No current facility-administered medications on file prior to encounter.    Current Outpatient Medications on File Prior to Encounter  Medication Sig Dispense Refill   albuterol (PROVENTIL) (2.5 MG/3ML) 0.083% nebulizer solution Take 2.5 mg by nebulization every 4 (four) hours as needed for wheezing or shortness of breath.     cholecalciferol (VITAMIN D) 1000 UNITS tablet Take 1,000 Units by mouth daily.       levothyroxine (SYNTHROID) 50 MCG tablet Take 50 mcg by mouth every evening.     lisinopril (ZESTRIL) 2.5 MG tablet Take 2.5 mg by mouth at bedtime.     metoprolol tartrate (LOPRESSOR) 25 MG tablet Take 0.5 tablets (12.5 mg total) by mouth daily. 45 tablet 3   rosuvastatin (CRESTOR) 20 MG tablet Take 1 tablet (20 mg total) by mouth daily. (Patient taking differently: Take 10 mg by mouth every evening. ) 90 tablet 3   tamsulosin (FLOMAX) 0.4 MG CAPS capsule Take 0.8 mg by mouth at bedtime.     warfarin (COUMADIN) 1 MG tablet Take 3 and a half tablets by mouth daily (or as directed) (Patient taking differently: Take 3-4 mg by mouth daily at 6 PM. Take 3 mg by mouth on Sunday, Tuesday, Thursday, Friday and Saturday. Take 4 mg on Monday and Wednesday) 100 tablet 3   carbamazepine (TEGRETOL) 200 MG tablet Take 1 1/2 tabs by mouth twice daily (Patient not taking: Reported on 12/21/2018) 270 tablet 3   docusate sodium (COLACE) 100 MG capsule  Take 200 mg by mouth daily.       levothyroxine (LEVOTHROID) 25 MCG tablet Take 1 tablet (25 mcg total) by mouth daily. 90 tablet 3   LORazepam (ATIVAN) 0.5 MG tablet Take one half tablet by mouth daily. (Patient not taking: Reported on 12/21/2018) 45 tablet 1   QUEtiapine (SEROQUEL) 25 MG tablet Take 1 tablet (25 mg total) by mouth 2 (two) times daily. (Patient not taking: Reported on 12/21/2018) 180 tablet 1   sertraline (ZOLOFT) 25 MG tablet Take 1 tablet (25 mg total) by mouth daily. (Patient not taking: Reported on 12/21/2018) 90 tablet 3   sorbitol 70 % solution Take 2 tablespoonfuls every two hours as needed.      Inpatient Medications: Scheduled Meds:  levothyroxine  50 mcg Oral QPM   metoprolol tartrate  25 mg Oral BID   sodium chloride flush  3 mL Intravenous Q12H   tamsulosin  0.8 mg Oral QHS   Continuous Infusions:  sodium chloride 250 mL (12/22/18 0545)   0.9 % NaCl with KCl 20 mEq / L 100 mL/hr at 12/22/18 0215   piperacillin-tazobactam (ZOSYN)  IV 3.375 g (12/22/18 0545)   PRN Meds: sodium chloride, acetaminophen **OR** acetaminophen, albuterol, HYDROcodone-acetaminophen, morphine injection, ondansetron **OR** ondansetron (ZOFRAN) IV, sodium chloride flush  Allergies:   No Known Allergies  Social History:   Social History   Socioeconomic History   Marital status: Legally Separated    Spouse name: Not on file   Number of children: 2   Years of education: Not on file   Highest education level: Not on file  Occupational History   Occupation: Retired Education administratorainter from [employer]    Employer: RETIRED  Social Needs   Physicist, medicalinancial resource strain: Not on file   Food insecurity    Worry: Not on file    Inability: Not on file   Transportation needs    Medical: Not on file    Non-medical: Not on file  Tobacco Use   Smoking status: Former Smoker    Years: 32.00    Types: Cigarettes, Pipe    Quit date: 11/27/2006    Years since quitting: 12.0   Smokeless  tobacco: Never Used  Substance and Sexual Activity   Alcohol use: No   Drug use: Not on file   Sexual activity: Not on file  Lifestyle   Physical activity    Days per week: Not on file    Minutes per session: Not on file   Stress: Not on file  Relationships   Social connections    Talks on phone: Not on file    Gets together: Not on file    Attends religious service: Not on file    Active member of club or organization: Not on file    Attends meetings of clubs or organizations: Not on file    Relationship status: Not on file   Intimate partner violence    Fear of current or ex partner: Not on file    Emotionally abused: Not on file    Physically abused: Not on file    Forced sexual activity: Not on file  Other Topics Concern   Not on file  Social History Narrative   Lives with daughter, Lonni FixLarrelle [phone] who works 1st shift as does her husband.   Regular exercise:  No ambulation with cane or walker.   Has been disabled since his stroke.    Family History:    Family History  Problem Relation Age of Onset   Hyperlipidemia Mother    Hypertension Mother    Arthritis Mother        osteoarthritis   Stroke Father    Heart disease Other        Family history of heart disease     ROS:  Please see the history of present illness.  Review of Systems  Unable to perform ROS: Dementia  All other ROS reviewedand negative.     Physical Exam/Data:   Vitals:   12/22/18 0642 12/22/18 0644 12/22/18 0712 12/22/18 0751  BP: (!) 96/54 108/65 103/77 102/64  Pulse: (!) 131 (!) 111 (!) 112 (!) 121  Resp: 20   19  Temp: 99.3 F (37.4 C)   99.4 F (37.4 C)  TempSrc: Oral   Oral  SpO2: 96% 96%  98%  Weight:      Height:        Intake/Output Summary (Last 24 hours)  at 12/22/2018 1034 Last data filed at 12/22/2018 0553 Gross per 24 hour  Intake 2498.22 ml  Output 700 ml  Net 1798.22 ml   Last 3 Weights 12/21/2018 11/27/2010 09/15/2010  Weight (lbs) 250 lb 191 lb  12.8 oz 203 lb  Weight (kg) 113.399 kg 87 kg 92.08 kg     Body mass index is 38.01 kg/m.  General:  Well nourished, well developed, in no acute distress HEENT: normal Lymph: no adenopathy Neck: no JVD Endocrine:  No thryomegaly Vascular: No carotid bruits; FA pulses 2+ bilaterally without bruits  Cardiac:  normal S1, S2; RRR; no murmur  Lungs:  clear to auscultation bilaterally, no wheezing, rhonchi or rales  Abd: soft, nontender, no hepatomegaly  Ext: no edema left lower extremity, trace minimally pitting swelling right lower extremity below the knee, bandage in place over right knee Musculoskeletal:  No deformities, BUE and BLE strength normal and equal Skin: warm and dry  Neuro: Right-sided weakness, full exam not performed, Psych:  Normal affect , some confusion  EKG:  The EKG was personally reviewed and demonstrates:   Personally reviewed by myself showing atrial fibrillation ventricular rate 131 bpm early R wave progression no other significant ST-T wave changes noted  Telemetry:  Telemetry was personally reviewed and demonstrates:   Atrial fibrillation, ventricular rate 110 up to 115 bpm  Relevant CV Studies: Echocardiogram pending  Laboratory Data:  Chemistry Recent Labs  Lab 12/21/18 0956 12/22/18 0303  NA 133* 137  K 3.9 4.2  CL 97* 110  CO2 20* 18*  GLUCOSE 170* 117*  BUN 56* 51*  CREATININE 3.67* 2.72*  CALCIUM 8.6* 7.6*  GFRNONAA 15* 21*  GFRAA 17* 25*  ANIONGAP 16* 9    Recent Labs  Lab 12/21/18 0956  PROT 7.5  ALBUMIN 3.2*  AST 72*  ALT 42  ALKPHOS 84  BILITOT 1.0   Hematology Recent Labs  Lab 12/21/18 0956 12/22/18 0303  WBC 19.8* 14.5*  RBC 3.26* 2.89*  HGB 10.5* 9.4*  HCT 31.6* 28.3*  MCV 96.9 97.9  MCH 32.2 32.5  MCHC 33.2 33.2  RDW 14.4 14.4  PLT 345 306   Cardiac Enzymes Recent Labs  Lab 12/21/18 0956 12/22/18 0303 12/22/18 0912  TROPONINI <0.03 <0.03 <0.03   No results for input(s): TROPIPOC in the last 168 hours.    BNPNo results for input(s): BNP, PROBNP in the last 168 hours.  DDimer No results for input(s): DDIMER in the last 168 hours.  Radiology/Studies:  Ct Abdomen Pelvis Wo Contrast  Result Date: 12/21/2018 CLINICAL DATA:  Diffuse abdominal pain and diarrhea. EXAM: CT ABDOMEN AND PELVIS WITHOUT CONTRAST TECHNIQUE: Multidetector CT imaging of the abdomen and pelvis was performed following the standard protocol without IV contrast. COMPARISON:  CT abdomen pelvis dated September 23, 2013. FINDINGS: Lower chest: No acute abnormality. Right greater than left basilar atelectasis. Hepatobiliary: No focal liver abnormality. Distended gallbladder with layering sludge or small stones, wall thickening, and pericholecystic inflammatory changes. No biliary dilatation. Pancreas: Fatty atrophy. No ductal dilatation or surrounding inflammatory changes. Spleen: Normal in size without focal abnormality. Adrenals/Urinary Tract: The adrenal glands are unremarkable. Left greater than right renal parapelvic cysts. No renal or ureteral calculi. No hydronephrosis. The bladder is unremarkable. Stomach/Bowel: The stomach is within normal limits. No bowel wall thickening, distention, or surrounding inflammatory changes. Fluid in the right colon, consistent with history of diarrhea. Prior appendectomy. Vascular/Lymphatic: Aortic atherosclerosis. Mildly enlarged portacaval lymph node measuring 1.4 cm in short axis, similar to prior  study, likely reactive. No additional enlarged abdominal or pelvic lymph nodes. Reproductive: Prostate is unremarkable. Other: Unchanged small fat containing bilateral inguinal and umbilical hernias. No free fluid or pneumoperitoneum. Musculoskeletal: No acute or significant osseous findings. IMPRESSION: 1. Acute cholecystitis. 2.  Aortic atherosclerosis (ICD10-I70.0). Electronically Signed   By: Obie Dredge M.D.   On: 12/21/2018 11:34   Ct Head Wo Contrast  Result Date: 12/21/2018 CLINICAL DATA:  Confusion.  Elevated INR. History of dementia, recent urinary tract infection and right leg wound. EXAM: CT HEAD WITHOUT CONTRAST TECHNIQUE: Contiguous axial images were obtained from the base of the skull through the vertex without intravenous contrast. COMPARISON:  CT head 01/04/2011. FINDINGS: Brain: There is no evidence of acute intracranial hemorrhage, mass lesion, brain edema or extra-axial fluid collection. A large area of encephalomalacia is again noted within the left temporal, parietal and occipital lobes consistent with an old left MCA infarct. There is stable mild generalized atrophy with prominence of the ventricles and subarachnoid spaces. There are mild chronic small vessel ischemic changes in the periventricular white matter. There is no CT evidence of acute cortical infarction. Vascular: Prominent intracranial vascular calcifications. No hyperdense vessel identified. Skull: Negative for fracture or focal lesion. Sinuses/Orbits: The visualized paranasal sinuses and mastoid air cells are clear. No orbital abnormalities are seen. Other: None. IMPRESSION: No acute intracranial findings identified. Stable sequela of remote large left MCA infarct and mild generalized atrophy. Electronically Signed   By: Carey Bullocks M.D.   On: 12/21/2018 11:27   Dg Chest Port 1 View  Result Date: 12/21/2018 CLINICAL DATA:  Wound on right leg.  UTI. EXAM: PORTABLE CHEST 1 VIEW COMPARISON:  August 18, 2013 FINDINGS: Healed left rib fractures. No pneumothorax. Some sort of electronic device overlies the patient's lower left chest obscuring the costophrenic angle. Within this limitation, no other significant abnormalities identified. Mild atelectasis in the medial right lung base. IMPRESSION: No active disease. Electronically Signed   By: Gerome Sam III M.D   On: 12/21/2018 10:32   US Abdomen Limited Ruq  Result Date: 12/21/2018 CLINICAL DATA:  Right upper quadrant pain. EXAM: ULTRASOUND ABDOMEN LIMITED RIGHT UPPER  QUADRANT COMPARISON:  December 21, 2018 CT scan FINDINGS: Gallbladder: There is sludge in the gallbladder. No Murphy's sign. The gallbladder wall thickness was measured at 3 mm. No stones or pericholecystic fluid. Common bile duct: Diameter: 5 mm Liver: Increased echogenicity with no focal mass. Portal vein is patent on color Doppler imaging with normal direction of blood flow towards the liver. IMPRESSION: 1. Gallbladder sludge is identified without Murphy's sign, pericholecystic fluid, stones, or wall thickening. The gallbladder wall was measured at 3 mm which is borderline. I believe the gallbladder wall measurement underestimates the gallbladder wall thickness which appears thicker on CT imaging. Also, there is significant fat stranding centered around a distended gallbladder on the CT scan highly concerning for acute cholecystitis. While the findings on this right upper quadrant ultrasound are not particularly suggestive of acute cholecystitis, the CT scan is very suggestive. If there is continued clinical ambiguity, recommend a HIDA scan. Electronically Signed   By: Gerome Sam III M.D   On: 12/21/2018 15:56    Assessment and Plan:   1.  Atrial fibrillation with RVR Review of records dating back 8 or more years suggest permanent atrial fibrillation Elevated rate in the setting of possible acute cholecystitis, right knee infection, dehydration, profound diarrhea Mild improvement in rate with hydration -Little room to add further medication for rate control  given his hypotension -We will need hold parameters for his metoprolol which was given this morning Not a good candidate for digoxin on a regular basis given his ATN/dehydration -For now would continue with fluid resuscitation, low doses of metoprolol as blood pressure tolerates -Would continue warfarin, markedly elevated CHADS VASC given prior history of stroke, PAD  2.  Acute cholecystitis Family reports he is been having abdominal  pain Leukocytosis improved, also recent diarrhea which seems to have improved today CT scan complete, right upper quadrant ultrasound complete Scheduled for HIDA scan  3) diarrhea Etiology unclear, cannot exclude C. Difficile Has not had any further diarrhea today but likely contributing to profound dehydration and weakness on arrival  4) acute renal failure Secondary to profound diarrhea, likely poor fluid intake, dehydration Numbers appear to be improving with IV fluids  5) right knee wound Reviewed recent pictures from daughter's phone Wound appears severe, wound consult pending  6) anemia Consider work-up, including iron studies  7) CAD With stable angina Prior stent LAD 10 years ago Denies having significant chest pain No further ischemic work-up at this time  8) PAD Known carotid disease Appears he is taking Crestor 10 mg daily, on warfarin for prior stroke Needs additional outpatient work-up, updated carotid  Long discussion with daughter at the bedside concerning details above  Total encounter time more than 110 minutes  Greater than 50% was spent in counseling and coordination of care with the patient   For questions or updates, please contact [place] Please consult [email or web address] for contact info under     Signed, Ida Rogue, MD  12/22/2018 10:34 AM

## 2018-12-22 NOTE — Progress Notes (Signed)
[facility] SURGICAL PROGRESS NOTE (cpt [phone])  Hospital Day(s): 1.   Post op day(s):  Darrell Hayes   Interval History: Patient seen and examined, no acute events or new complaints overnight. Patient with history of dementia and daughter at bedside provided history. Still with complaints of abdominal pain. No fevers. [place] results not as suspicious for cholecystitis as CT results from yesterday. Leukocytosis improved to 14K. INR down to 1.4 from 5.6. Renal function mildly improved. Still tachycardic and irregular   Review of Systems:  Unable to preform secondary to dementia   Vital signs in last 24 hours: [min-max] current  Temp:  [98.4 F (36.9 C)-99.5 F (37.5 C)] 99.4 F (37.4 C) (06/18 0751) Pulse Rate:  [101-159] 121 (06/18 0751) Resp:  [19-30] 19 (06/18 0751) BP: (78-177)/(50-157) 102/64 (06/18 0751) SpO2:  [94 %-98 %] 98 % (06/18 0751) Weight:  [113.4 kg] 113.4 kg (06/17 0954)     Height: 5\' 8"  (172.7 cm) Weight: 113.4 kg BMI (Calculated): 38.02   Intake/Output last 2 shifts:  06/17 0701 - 06/18 0700 In: 2498.2 [I.V.:556.2; IV Piggyback:1942.1] Out: 700 [Urine:700]   Physical Exam:  Constitutional: alert, cooperative and no distress  HENT: normocephalic without obvious abnormality  Eyes: PERRL, EOM's grossly intact and symmetric  Respiratory: breathing non-labored at rest  Cardiovascular: Tachycardia, irregularly irregular Gastrointestinal: soft, RUQ tenderness, and non-distended. No rebound/guarding. Negative Murphy's Sign   Labs:  CBC Latest Ref Rng & Units 12/22/2018 12/21/2018 09/22/2013  WBC 4.0 - 10.5 K/uL 14.5(H) 19.8(H) 10.8(H)  Hemoglobin 13.0 - 17.0 g/dL 9.4(L) 10.5(L) 14.2  Hematocrit 39.0 - 52.0 % 28.3(L) 31.6(L) 42.0  Platelets 150 - 400 K/uL 306 345 204   CMP Latest Ref Rng & Units 12/22/2018 12/21/2018 09/22/2013  Glucose 70 - 99 mg/dL 117(H) 170(H) 105(H)  BUN 8 - 23 mg/dL 51(H) 56(H) 12  Creatinine 0.61 - 1.24 mg/dL 2.72(H) 3.67(H) 1.21  Sodium  135 - 145 mmol/L 137 133(L) 137  Potassium 3.5 - 5.1 mmol/L 4.2 3.9 4.2  Chloride 98 - 111 mmol/L 110 97(L) 106  CO2 22 - 32 mmol/L 18(L) 20(L) 28  Calcium 8.9 - 10.3 mg/dL 7.6(L) 8.6(L) 8.4(L)  Total Protein 6.5 - 8.1 g/dL - 7.5 7.4  Total Bilirubin 0.3 - 1.2 mg/dL - 1.0 0.3  Alkaline Phos 38 - 126 U/L - 84 134(H)  AST 15 - 41 U/L - 72(H) 22  ALT 0 - 44 U/L - 42 17     Imaging studies:   RUQ [place] (12/21/2018) personally reviewed and radiologist report reviewed:  IMPRESSION: 1. Gallbladder sludge is identified without Murphy's sign, pericholecystic fluid, stones, or wall thickening. The gallbladder wall was measured at 3 mm which is borderline. I believe the gallbladder wall measurement underestimates the gallbladder wall thickness which appears thicker on CT imaging. Also, there is significant fat stranding centered around a distended gallbladder on the CT scan highly concerning for acute cholecystitis. While the findings on this right upper quadrant ultrasound are not particularly suggestive of acute cholecystitis, the CT scan is very suggestive. If there is continued clinical ambiguity, recommend a HIDA scan.   Assessment/Plan: (ICD-10's: R10.11) 78 y.o. male with RUQ pain and improved leukocytosis which may be attributed to acute cholecystitis although there is some discrepancy between imaging results, otherwise complicated by pertinent comorbidities including Afib with RVR with supratherapeutic INR level on arrival which is corrected as well as improving AKI, intermittent hypotension, Diarrhea pending C diff, and a history of HTN, HLD, dementia following CVA, and  advanced age.    - Will obtain HIDA scan today due to discrepancy in US/CT results  - Again, patient is a poor surgical candidate and will likely require percutaneous cholecystostomy tube.   - Remain NPO for now + IVF   - Continue Abx (Zosyn)   - Pain control prn; antiemetics prn  - monitor abdominal examination  - trend  leukocytosis, renal function, INR levels; all are improving   - further management per primary and consulting teams; appreciate their help   All of the above findings and recommendations were discussed with the patient, patient's family (daughter at bedside secondary to patient's dementia), and the medical team, and all of his daughter's questions were answered to her expressed satisfaction.  -- Lynden OxfordZachary Schulz, PA-C [facility] 12/22/2018, 8:01 AM [phone] M-F: 7am - 4pm

## 2018-12-22 NOTE — Progress Notes (Signed)
Initial Nutrition Assessment  DOCUMENTATION CODES:   Obesity unspecified  INTERVENTION:   RD will add supplements once diet advanced  NUTRITION DIAGNOSIS:   Inadequate oral intake related to acute illness as evidenced by NPO status.  GOAL:   Patient will meet greater than or equal to 90% of their needs  MONITOR:   Labs, PO intake, Supplement acceptance, Weight trends, Skin, I & O's  REASON FOR ASSESSMENT:   Malnutrition Screening Tool    ASSESSMENT:   78 y.o. male with RUQ pain likely from acute cholecystitis complicated by pertinent comorbidities including Afib with RVR with supratherapeutic INR level on arrival which is corrected as well as improving AKI, intermittent hypotension, Diarrhea pending C diff, and a history of HTN, HLD, dementia following CVA, and advanced age.  RD working remotely.  Unable to speak with pt r/t dementia. Per chart review, pt with poor appetite and oral intake for 3 days pta r/t abdominal pain. Pt currently NPO for HIDA scan today. RD will order supplements once diet advanced. There is no recent documented weights in chart to determine if any significant weight loss.   Medications reviewed and include: synthroid, NaCl w/ KCl @100ml /hr, zosyn  Labs reviewed: BUN 51(H), creat 2.72(H), Mg 2.5(H) Wbc- 14.5(H), Hgb 9.4(L), Hct 28.3(L)  Unable to complete Nutrition-Focused physical exam at this time.   Diet Order:   Diet Order            Diet NPO time specified Except for: Sips with Meds  Diet effective now             EDUCATION NEEDS:   No education needs have been identified at this time  Skin:  Skin Assessment: Reviewed RN Assessment(ecchymosis, MASD, wound R knee)  Last BM:  6/17- type 7  Height:   Ht Readings from Last 1 Encounters:  12/21/18 5\' 8"  (1.727 m)    Weight:   Wt Readings from Last 1 Encounters:  12/21/18 113.4 kg    Ideal Body Weight:  70 kg  BMI:  Body mass index is 38.01 kg/m.  Estimated Nutritional  Needs:   Kcal:  2100-2400kcal/day  Protein:  105-120g/day  Fluid:  2.1L/day  Koleen Distance MS, RD, LDN Pager #- [phone] Office#- [phone] After Hours Pager: [phone]

## 2018-12-22 NOTE — TOC Initial Note (Signed)
Transition of Care [facility]) - Initial/Assessment Note    Patient Details  Name: Darrell Hayes MRN: [id number] Date of Birth: 11/24/1940  [facility] CM/SW Contact:    Beverly Sessions, RN Phone Number: 12/22/2018, 3:09 PM  Clinical Narrative:                 Patient admitted with acute cholecystitis.   RNCM spoke with daughter via phone.  Patient lives at home with daughter.  Receives PACE services in the home as follows - Monday- Friday, 7am-9am, 11:15pm-2:45 pm; 4:30pm-8pm - Every other weekend someone comes out Saturday and Sunday from 11am-7pm  Daughter states she feels the patient will require additional services at discharge, as she works 12 hour shifts  She states that patient is ambulatory with a hemiwalker at baseline, and also has a WC for when they go out.   Per chart it appears the plan will be for a percutaneous drain.  Ortho consult pending on knee  RNCM spoke with Robin at [place].  Trudy the SW at [facility] also called for an update  Expected Discharge Plan: (Home with PACE) Barriers to Discharge: Continued Medical Work up   Patient Goals and CMS Choice        Expected Discharge Plan and Services Expected Discharge Plan: (Home with PACE)   Discharge Planning Services: CM Consult   Living arrangements for the past 2 months: [place]                                      Prior Living Arrangements/Services Living arrangements for the past 2 months: Single Family Home Lives with:: Adult Children Patient language and need for interpreter reviewed:: Yes        Need for Family Participation in Patient Care: Yes (Comment) Care giver support system in place?: Yes (comment) Current home services: DME, Other (comment)(PACE) Criminal Activity/Legal Involvement Pertinent to Current Situation/Hospitalization: No - Comment as needed  Activities of Daily Living Home Assistive Devices/Equipment: Walker (specify type) ADL Screening  (condition at time of admission) Patient's cognitive ability adequate to safely complete daily activities?: No Is the patient deaf or have difficulty hearing?: Yes Does the patient have difficulty seeing, even when wearing glasses/contacts?: No Does the patient have difficulty concentrating, remembering, or making decisions?: Yes Patient able to express need for assistance with ADLs?: Yes Does the patient have difficulty dressing or bathing?: Yes Independently performs ADLs?: No Communication: Independent Dressing (OT): Needs assistance Is this a change from baseline?: Pre-admission baseline Grooming: Needs assistance Is this a change from baseline?: Pre-admission baseline Feeding: Independent Bathing: Needs assistance Is this a change from baseline?: Pre-admission baseline Toileting: Needs assistance Is this a change from baseline?: Pre-admission baseline In/Out Bed: Needs assistance Is this a change from baseline?: Pre-admission baseline Walks in Home: Needs assistance Is this a change from baseline?: Pre-admission baseline Does the patient have difficulty walking or climbing stairs?: Yes Weakness of Legs: Both Weakness of Arms/Hands: None  Permission Sought/Granted                  Emotional Assessment              Admission diagnosis:  RUQ abdominal pain [R10.11] AKI (acute kidney injury) ([place]) [N17.9] Hypotension, unspecified hypotension type [I95.9] Sepsis ([place]) [A41.9] Patient Active Problem List   Diagnosis Date Noted  . Sepsis ([place]) 12/21/2018  . ARF (acute renal  failure) (HCC) 01/05/2011  . Altered mental status 12/23/2010  . BACK PAIN 09/15/2010  . PRESSURE ULCER BUTTOCK 07/31/2010  . CEREBROVASCULAR DISEASE 06/12/2010  . DEPRESSION 04/08/2010  . HEPATITIS 04/08/2010  . SEIZURE DISORDER 04/08/2010  . Personal history of unspecified circulatory disease 04/08/2010  . UTI'S, HX OF 04/08/2010  . CHICKENPOX, HX OF 04/08/2010  . OTHER AND UNSPECIFIED  HYPERLIPIDEMIA 06/14/2009  . UNSPEC ALVEOLAR&PARIETOALVEOLAR PNEUMONOPATHY 06/14/2009  . ABNORMAL PULMONARY TEST RESULTS 06/14/2009  . PULMONARY NODULE 05/28/2009  . HYPOPOTASSEMIA 05/08/2009  . CAD 04/30/2009  . Nonspecific (abnormal) findings on radiological and other examination of body structure 04/30/2009  . COMPUTERIZED TOMOGRAPHY, CHEST, ABNORMAL 04/30/2009  . CHEST PAIN 09/25/2008  . HYPERCHOLESTEROLEMIA, PURE 09/11/2008  . Shortness of breath 09/11/2008  . HYPERTENSION 09/10/2008  . ATRIAL FIBRILLATION 09/10/2008  . CVA 09/10/2008   PCP:  Unice Baileyosenberg, Beth S, MD Pharmacy:   [facility] [phone]#[id number] [place] - [address] [facility] [address] [place] [id number] Phone: [phone] Fax: [phone]     Social Determinants of Health (SDOH) Interventions    Readmission Risk Interventions No flowsheet data found.

## 2018-12-22 NOTE — Consult Note (Signed)
ORTHOPAEDIC CONSULTATION  REQUESTING PHYSICIAN: Milagros LollSudini, Srikar, MD  Chief Complaint: Right knee injury  HPI: Darrell Hayes is a 78 y.o. male who complains of  Right knee injury after a fall 3 weeks ago. Currently has raw wound medially with minimal discoloration around it.  Currently on antibiotics for GB infection.    Past Medical History:  Diagnosis Date  . Agitation    and irritability, situational exacerbation  . Alveolar/parietoalveolar pneumonopathy, other    06/14/09 CT chest, Mild LLL GGO; 02/28/10 CT chest , improved  . Atrial fibrillation (HCC)    Coumadin therapy - Dr. Jens Somrenshaw.  Cardioversion 08/23/09, Amiodarone Rx since 09/03/2009  . CAD (coronary artery disease) 09/2008   Dr. Jens Somrenshaw, stent to LAD  . CVA (cerebral vascular accident) (HCC) 2007   In the setting of atrial fibrillation  . Dyspnea 09/2008   Class 3, since 2008.  Significant tachycardia.  No PE or fibrosis on CT chest 3/10, 12/10, 02/2010, normal PFT but isolated low DLCO  06/2009, normal hgb, TSH, creat and albumin , Dec 2010.  Resolved after A Fib cardioversion and Amiodarone Marh 2011  . Hepatitis   . Hiatal hernia   . History of chickenpox   . Hyperlipidemia   . Hypertension   . Nonspecific abnormal results of pulmonary system function study   . Pulmonary nodules 09/2008   Nonspecific reticulonodular nodules RLL superior segment, resolved on CT 06/14/09  . Seizures (HCC) 12/2008   Dr. Sharene SkeansHickling   Past Surgical History:  Procedure Laterality Date  . APPENDECTOMY    . Stent placed  09/2008   Social History   Socioeconomic History  . Marital status: Legally Separated    Spouse name: Not on file  . Number of children: 2  . Years of education: Not on file  . Highest education level: Not on file  Occupational History  . Occupation: Retired Education administratorainter from [employer]    Employer: RETIRED  Social Needs  . Financial resource strain: Not on file  . Food insecurity    Worry: Not on file    Inability: Not on  file  . Transportation needs    Medical: Not on file    Non-medical: Not on file  Tobacco Use  . Smoking status: Former Smoker    Years: 32.00    Types: Cigarettes, Pipe    Quit date: 11/27/2006    Years since quitting: 12.0  . Smokeless tobacco: Never Used  Substance and Sexual Activity  . Alcohol use: No  . Drug use: Not on file  . Sexual activity: Not on file  Lifestyle  . Physical activity    Days per week: Not on file    Minutes per session: Not on file  . Stress: Not on file  Relationships  . Social Musicianconnections    Talks on phone: Not on file    Gets together: Not on file    Attends religious service: Not on file    Active member of club or organization: Not on file    Attends meetings of clubs or organizations: Not on file    Relationship status: Not on file  Other Topics Concern  . Not on file  Social History Narrative   Lives with daughter, Darrell FixLarrelle [phone] who works 1st shift as does her husband.   Regular exercise:  No ambulation with cane or walker.   Has been disabled since his stroke.   Family History  Problem Relation Age of Onset  . Hyperlipidemia Mother   .  Hypertension Mother   . Arthritis Mother        osteoarthritis  . Stroke Father   . Heart disease Other        Family history of heart disease   No Known Allergies Prior to Admission medications   Medication Sig Start Date End Date Taking? Authorizing Provider  albuterol (PROVENTIL) (2.5 MG/3ML) 0.083% nebulizer solution Take 2.5 mg by nebulization every 4 (four) hours as needed for wheezing or shortness of breath.   Yes [provider]  cholecalciferol (VITAMIN D) 1000 UNITS tablet Take 1,000 Units by mouth daily.     Yes [provider]  levothyroxine (SYNTHROID) 50 MCG tablet Take 50 mcg by mouth every evening.   Yes [provider]  lisinopril (ZESTRIL) 2.5 MG tablet Take 2.5 mg by mouth at bedtime.   Yes [provider]  metoprolol tartrate (LOPRESSOR)  25 MG tablet Take 0.5 tablets (12.5 mg total) by mouth daily. 04/14/11  Yes Joaquim Namuncan, [place] S, MD  rosuvastatin (CRESTOR) 20 MG tablet Take 1 tablet (20 mg total) by mouth daily. Patient taking differently: Take 10 mg by mouth every evening.  04/14/11  Yes Joaquim Namuncan, [place] S, MD  tamsulosin (FLOMAX) 0.4 MG CAPS capsule Take 0.8 mg by mouth at bedtime.   Yes [provider]  warfarin (COUMADIN) 1 MG tablet Take 3 and a half tablets by mouth daily (or as directed) Patient taking differently: Take 3-4 mg by mouth daily at 6 PM. Take 3 mg by mouth on Sunday, Tuesday, Thursday, Friday and Saturday. Take 4 mg on Monday and Wednesday 04/14/11  Yes Joaquim Namuncan, [place] S, MD  amiodarone (PACERONE) 200 MG tablet Take 1 tablet (200 mg total) by mouth daily. Patient not taking: Reported on 12/21/2018 04/14/11   Joaquim Namuncan, [place] S, MD  carbamazepine (TEGRETOL) 200 MG tablet Take 1 1/2 tabs by mouth twice daily Patient not taking: Reported on 12/21/2018 04/14/11   Joaquim Namuncan, [place] S, MD  docusate sodium (COLACE) 100 MG capsule Take 200 mg by mouth daily.      [provider]  levothyroxine (LEVOTHROID) 25 MCG tablet Take 1 tablet (25 mcg total) by mouth daily. 04/14/11 04/13/12  Joaquim Namuncan, [place] S, MD  LORazepam (ATIVAN) 0.5 MG tablet Take one half tablet by mouth daily. Patient not taking: Reported on 12/21/2018 04/14/11   Joaquim Namuncan, [place] S, MD  QUEtiapine (SEROQUEL) 25 MG tablet Take 1 tablet (25 mg total) by mouth 2 (two) times daily. Patient not taking: Reported on 12/21/2018 04/14/11   Joaquim Namuncan, [place] S, MD  sertraline (ZOLOFT) 25 MG tablet Take 1 tablet (25 mg total) by mouth daily. Patient not taking: Reported on 12/21/2018 04/14/11   Joaquim Namuncan, [place] S, MD  sorbitol 70 % solution Take 2 tablespoonfuls every two hours as needed.     [provider]   Ct Abdomen Pelvis Wo Contrast  Result Date: 12/21/2018 CLINICAL DATA:  Diffuse abdominal pain and diarrhea. EXAM: CT ABDOMEN AND PELVIS WITHOUT CONTRAST  TECHNIQUE: Multidetector CT imaging of the abdomen and pelvis was performed following the standard protocol without IV contrast. COMPARISON:  CT abdomen pelvis dated September 23, 2013. FINDINGS: Lower chest: No acute abnormality. Right greater than left basilar atelectasis. Hepatobiliary: No focal liver abnormality. Distended gallbladder with layering sludge or small stones, wall thickening, and pericholecystic inflammatory changes. No biliary dilatation. Pancreas: Fatty atrophy. No ductal dilatation or surrounding inflammatory changes. Spleen: Normal in size without focal abnormality. Adrenals/Urinary Tract: The adrenal glands are unremarkable. Left greater than right renal  parapelvic cysts. No renal or ureteral calculi. No hydronephrosis. The bladder is unremarkable. Stomach/Bowel: The stomach is within normal limits. No bowel wall thickening, distention, or surrounding inflammatory changes. Fluid in the right colon, consistent with history of diarrhea. Prior appendectomy. Vascular/Lymphatic: Aortic atherosclerosis. Mildly enlarged portacaval lymph node measuring 1.4 cm in short axis, similar to prior study, likely reactive. No additional enlarged abdominal or pelvic lymph nodes. Reproductive: Prostate is unremarkable. Other: Unchanged small fat containing bilateral inguinal and umbilical hernias. No free fluid or pneumoperitoneum. Musculoskeletal: No acute or significant osseous findings. IMPRESSION: 1. Acute cholecystitis. 2.  Aortic atherosclerosis (ICD10-I70.0). Electronically Signed   By: Titus Dubin M.D.   On: 12/21/2018 11:34   Ct Head Wo Contrast  Result Date: 12/21/2018 CLINICAL DATA:  Confusion. Elevated INR. History of dementia, recent urinary tract infection and right leg wound. EXAM: CT HEAD WITHOUT CONTRAST TECHNIQUE: Contiguous axial images were obtained from the base of the skull through the vertex without intravenous contrast. COMPARISON:  CT head 01/04/2011. FINDINGS: Brain: There is no  evidence of acute intracranial hemorrhage, mass lesion, brain edema or extra-axial fluid collection. A large area of encephalomalacia is again noted within the left temporal, parietal and occipital lobes consistent with an old left MCA infarct. There is stable mild generalized atrophy with prominence of the ventricles and subarachnoid spaces. There are mild chronic small vessel ischemic changes in the periventricular white matter. There is no CT evidence of acute cortical infarction. Vascular: Prominent intracranial vascular calcifications. No hyperdense vessel identified. Skull: Negative for fracture or focal lesion. Sinuses/Orbits: The visualized paranasal sinuses and mastoid air cells are clear. No orbital abnormalities are seen. Other: None. IMPRESSION: No acute intracranial findings identified. Stable sequela of remote large left MCA infarct and mild generalized atrophy. Electronically Signed   By: Richardean Sale M.D.   On: 12/21/2018 11:27   US Venous Img Lower Unilateral Right  Result Date: 12/22/2018 CLINICAL DATA:  Right lower extremity pain and edema. History of injury involving the right lower extremity. Evaluate for DVT. EXAM: RIGHT LOWER EXTREMITY VENOUS DOPPLER ULTRASOUND TECHNIQUE: Gray-scale sonography with graded compression, as well as color Doppler and duplex ultrasound were performed to evaluate the lower extremity deep venous systems from the level of the common femoral vein and including the common femoral, femoral, profunda femoral, popliteal and calf veins including the posterior tibial, peroneal and gastrocnemius veins when visible. The superficial great saphenous vein was also interrogated. Spectral Doppler was utilized to evaluate flow at rest and with distal augmentation maneuvers in the common femoral, femoral and popliteal veins. COMPARISON:  None. FINDINGS: Contralateral Common Femoral Vein: Respiratory phasicity is normal and symmetric with the symptomatic side. No evidence of  thrombus. Normal compressibility. Common Femoral Vein: No evidence of thrombus. Normal compressibility, respiratory phasicity and response to augmentation. Saphenofemoral Junction: No evidence of thrombus. Normal compressibility and flow on color Doppler imaging. Profunda Femoral Vein: No evidence of thrombus. Normal compressibility and flow on color Doppler imaging. Femoral Vein: No evidence of thrombus. Normal compressibility, respiratory phasicity and response to augmentation. Popliteal Vein: No evidence of thrombus. Normal compressibility, respiratory phasicity and response to augmentation. Calf Veins: No evidence of thrombus. Normal compressibility and flow on color Doppler imaging. Superficial Great Saphenous Vein: No evidence of thrombus. Normal compressibility. Venous Reflux:  None. Other Findings:  None. IMPRESSION: No evidence of DVT within the right lower extremity. Electronically Signed   By: Sandi Mariscal M.D.   On: 12/22/2018 14:17   Dg Chest [facility]  Result Date: 12/21/2018 CLINICAL DATA:  Wound on right leg.  UTI. EXAM: PORTABLE CHEST 1 VIEW COMPARISON:  August 18, 2013 FINDINGS: Healed left rib fractures. No pneumothorax. Some sort of electronic device overlies the patient's lower left chest obscuring the costophrenic angle. Within this limitation, no other significant abnormalities identified. Mild atelectasis in the medial right lung base. IMPRESSION: No active disease. Electronically Signed   By: Gerome Samavid  Williams III M.D   On: 12/21/2018 10:32   [place] Abdomen Limited Ruq  Result Date: 12/21/2018 CLINICAL DATA:  Right upper quadrant pain. EXAM: ULTRASOUND ABDOMEN LIMITED RIGHT UPPER QUADRANT COMPARISON:  December 21, 2018 CT scan FINDINGS: Gallbladder: There is sludge in the gallbladder. No Murphy's sign. The gallbladder wall thickness was measured at 3 mm. No stones or pericholecystic fluid. Common bile duct: Diameter: 5 mm Liver: Increased echogenicity with no focal mass. Portal vein is  patent on color Doppler imaging with normal direction of blood flow towards the liver. IMPRESSION: 1. Gallbladder sludge is identified without Murphy's sign, pericholecystic fluid, stones, or wall thickening. The gallbladder wall was measured at 3 mm which is borderline. I believe the gallbladder wall measurement underestimates the gallbladder wall thickness which appears thicker on CT imaging. Also, there is significant fat stranding centered around a distended gallbladder on the CT scan highly concerning for acute cholecystitis. While the findings on this right upper quadrant ultrasound are not particularly suggestive of acute cholecystitis, the CT scan is very suggestive. If there is continued clinical ambiguity, recommend a HIDA scan. Electronically Signed   By: Gerome Samavid  Williams III M.D   On: 12/21/2018 15:56    Positive ROS: All other systems have been reviewed and were otherwise negative with the exception of those mentioned in the HPI and as above.  Physical Exam: General: Alert, no acute distress Cardiovascular: No pedal edema Respiratory: No cyanosis, no use of accessory musculature GI: No organomegaly, abdomen is soft and non-tender Skin: No lesions in the area of chief complaint Neurologic: Sensation intact distally Psychiatric: Patient is competent for consent with normal mood and affect Lymphatic: No axillary or cervical lymphadenopathy  MUSCULOSKELETAL: Deep abrasion medial aspect of right knee.  No knee effusion or cellutlitis.  ROM satisfactory with minimal pain.  Purplish discoloration around wound.   Assessment: Deep abrasion right knee Little risk of knee infection  Plan: More aggressive dressing changes and Kpad to knee RTC one week if not improving.     Valinda HoarHoward E Danta Baumgardner, MD [phone]   12/22/2018 4:51 PM

## 2018-12-22 NOTE — Consult Note (Signed)
[place] Nurse wound consult note Reason for Consult: right knee wound Wound type: trauma; sustained 2 weeks ago Has been followed by PACE doctor for care Pressure Injury POA: NA Measurement: 5cm x 6.5cm x 0.2cm  Wound bed:50% non viable tissue presence in the central section of the wound bed, 50% pink, moist along the periphery  Drainage (amount, consistency, odor) minimal, slight odor Periwound: edema, palpable distal pulses  Dressing procedure/placement/frequency: Add enzymatic debridement ointment daily.  Top with saline moist gauze and foam dressing.  Has had manuka honey alginate used for topical care for antimicrobial with off label effects of selective debridement, remove edges of honey dressings at the time of my assessment  Will suggest orthopedic or surgical consultation; daughter has images and wound has progressively worsened.   Discussed POC with patient and bedside nurse.  Re consult if needed, will not follow at this time. Thanks  Demetri Goshert [employer], RN,CWOCN, CNS, [place] [phone])

## 2018-12-22 NOTE — Progress Notes (Signed)
[facility] - [place] [facility]   PATIENT NAME: Darrell Hayes    MR#:  [id number]  DATE OF BIRTH:  06-30-1941  SUBJECTIVE:  CHIEF COMPLAINT:   Chief Complaint  Patient presents with  . Code Sepsis   Patient is a poor historian.  Complains of some right knee pain.  REVIEW OF SYSTEMS:    Review of Systems  Constitutional: Positive for malaise/fatigue. Negative for chills and fever.  HENT: Negative for sore throat.   Eyes: Negative for blurred vision, double vision and pain.  Respiratory: Negative for cough, hemoptysis, shortness of breath and wheezing.   Cardiovascular: Negative for chest pain, palpitations, orthopnea and leg swelling.  Gastrointestinal: Positive for abdominal pain. Negative for constipation, diarrhea, heartburn, nausea and vomiting.  Genitourinary: Negative for dysuria and hematuria.  Musculoskeletal: Positive for joint pain. Negative for back pain.  Skin: Negative for rash.  Neurological: Negative for sensory change, speech change, focal weakness and headaches.  Endo/Heme/Allergies: Does not bruise/bleed easily.  Psychiatric/Behavioral: Negative for depression. The patient is not nervous/anxious.     DRUG ALLERGIES:  No Known Allergies  VITALS:  Blood pressure 102/64, pulse (!) 121, temperature 99.4 F (37.4 C), temperature source Oral, resp. rate 19, height 5\' 8"  (1.727 m), weight 113.4 kg, SpO2 98 %.  PHYSICAL EXAMINATION:   Physical Exam  GENERAL:  78 y.o.-year-old patient lying in the bed with no acute distress.  EYES: Pupils equal, round, reactive to light and accommodation. No scleral icterus. Extraocular muscles intact.  HEENT: Head atraumatic, normocephalic. Oropharynx and nasopharynx clear.  NECK:  Supple, no jugular venous distention. No thyroid enlargement, no tenderness.  LUNGS: Normal breath sounds bilaterally, no wheezing, rales, rhonchi. No use of accessory muscles of respiration.  CARDIOVASCULAR: S1, S2 normal. No  murmurs, rubs, or gallops.  ABDOMEN: Soft, tenderness diffusely, nondistended. Bowel sounds present. No organomegaly or mass.  EXTREMITIES: No cyanosis, clubbing or edema b/l.    NEUROLOGIC: Cranial nerves II through XII are intact. No focal Motor or sensory deficits b/l.   PSYCHIATRIC: The patient is alert and awake SKIN:  Right knee with 5 x 6 open wound with surrounding redness.  Significant tenderness of the knee.  Also has hematoma below and about the knee which seems to be fading away.  LABORATORY PANEL:   CBC Recent Labs  Lab 12/22/18 0303  WBC 14.5*  HGB 9.4*  HCT 28.3*  PLT 306   ------------------------------------------------------------------------------------------------------------------ Chemistries  Recent Labs  Lab 12/21/18 0956 12/22/18 0303  NA 133* 137  K 3.9 4.2  CL 97* 110  CO2 20* 18*  GLUCOSE 170* 117*  BUN 56* 51*  CREATININE 3.67* 2.72*  CALCIUM 8.6* 7.6*  MG  --  2.5*  AST 72*  --   ALT 42  --   ALKPHOS 84  --   BILITOT 1.0  --    ------------------------------------------------------------------------------------------------------------------  Cardiac Enzymes Recent Labs  Lab 12/22/18 0912  TROPONINI <0.03   ------------------------------------------------------------------------------------------------------------------  RADIOLOGY:  Ct Abdomen Pelvis Wo Contrast  Result Date: 12/21/2018 CLINICAL DATA:  Diffuse abdominal pain and diarrhea. EXAM: CT ABDOMEN AND PELVIS WITHOUT CONTRAST TECHNIQUE: Multidetector CT imaging of the abdomen and pelvis was performed following the standard protocol without IV contrast. COMPARISON:  CT abdomen pelvis dated September 23, 2013. FINDINGS: Lower chest: No acute abnormality. Right greater than left basilar atelectasis. Hepatobiliary: No focal liver abnormality. Distended gallbladder with layering sludge or small stones, wall thickening, and pericholecystic inflammatory changes. No biliary dilatation.  Pancreas: Fatty atrophy.  No ductal dilatation or surrounding inflammatory changes. Spleen: Normal in size without focal abnormality. Adrenals/Urinary Tract: The adrenal glands are unremarkable. Left greater than right renal parapelvic cysts. No renal or ureteral calculi. No hydronephrosis. The bladder is unremarkable. Stomach/Bowel: The stomach is within normal limits. No bowel wall thickening, distention, or surrounding inflammatory changes. Fluid in the right colon, consistent with history of diarrhea. Prior appendectomy. Vascular/Lymphatic: Aortic atherosclerosis. Mildly enlarged portacaval lymph node measuring 1.4 cm in short axis, similar to prior study, likely reactive. No additional enlarged abdominal or pelvic lymph nodes. Reproductive: Prostate is unremarkable. Other: Unchanged small fat containing bilateral inguinal and umbilical hernias. No free fluid or pneumoperitoneum. Musculoskeletal: No acute or significant osseous findings. IMPRESSION: 1. Acute cholecystitis. 2.  Aortic atherosclerosis (ICD10-I70.0). Electronically Signed   By: Titus Dubin M.D.   On: 12/21/2018 11:34   Ct Head Wo Contrast  Result Date: 12/21/2018 CLINICAL DATA:  Confusion. Elevated INR. History of dementia, recent urinary tract infection and right leg wound. EXAM: CT HEAD WITHOUT CONTRAST TECHNIQUE: Contiguous axial images were obtained from the base of the skull through the vertex without intravenous contrast. COMPARISON:  CT head 01/04/2011. FINDINGS: Brain: There is no evidence of acute intracranial hemorrhage, mass lesion, brain edema or extra-axial fluid collection. A large area of encephalomalacia is again noted within the left temporal, parietal and occipital lobes consistent with an old left MCA infarct. There is stable mild generalized atrophy with prominence of the ventricles and subarachnoid spaces. There are mild chronic small vessel ischemic changes in the periventricular white matter. There is no CT evidence of  acute cortical infarction. Vascular: Prominent intracranial vascular calcifications. No hyperdense vessel identified. Skull: Negative for fracture or focal lesion. Sinuses/Orbits: The visualized paranasal sinuses and mastoid air cells are clear. No orbital abnormalities are seen. Other: None. IMPRESSION: No acute intracranial findings identified. Stable sequela of remote large left MCA infarct and mild generalized atrophy. Electronically Signed   By: Richardean Sale M.D.   On: 12/21/2018 11:27   Dg Chest Port 1 View  Result Date: 12/21/2018 CLINICAL DATA:  Wound on right leg.  UTI. EXAM: PORTABLE CHEST 1 VIEW COMPARISON:  August 18, 2013 FINDINGS: Healed left rib fractures. No pneumothorax. Some sort of electronic device overlies the patient's lower left chest obscuring the costophrenic angle. Within this limitation, no other significant abnormalities identified. Mild atelectasis in the medial right lung base. IMPRESSION: No active disease. Electronically Signed   By: Dorise Bullion III M.D   On: 12/21/2018 10:32   US Abdomen Limited Ruq  Result Date: 12/21/2018 CLINICAL DATA:  Right upper quadrant pain. EXAM: ULTRASOUND ABDOMEN LIMITED RIGHT UPPER QUADRANT COMPARISON:  December 21, 2018 CT scan FINDINGS: Gallbladder: There is sludge in the gallbladder. No Murphy's sign. The gallbladder wall thickness was measured at 3 mm. No stones or pericholecystic fluid. Common bile duct: Diameter: 5 mm Liver: Increased echogenicity with no focal mass. Portal vein is patent on color Doppler imaging with normal direction of blood flow towards the liver. IMPRESSION: 1. Gallbladder sludge is identified without Murphy's sign, pericholecystic fluid, stones, or wall thickening. The gallbladder wall was measured at 3 mm which is borderline. I believe the gallbladder wall measurement underestimates the gallbladder wall thickness which appears thicker on CT imaging. Also, there is significant fat stranding centered around a  distended gallbladder on the CT scan highly concerning for acute cholecystitis. While the findings on this right upper quadrant ultrasound are not particularly suggestive of acute cholecystitis, the CT scan is  very suggestive. If there is continued clinical ambiguity, recommend a HIDA scan. Electronically Signed   By: Gerome Samavid  Williams III M.D   On: 12/21/2018 15:56     ASSESSMENT AND PLAN:   *Acute cholecystitis.  HIDA scan ordered by surgical team.  Will likely need percutaneous cholecystostomy with IR.  On IV Zosyn  *UTI -on IV Zosyn.  Cultures pending  *Sepsis present on admission due to acute infection.  Improving.  *Atrial fibrillation with rapid ventricular rate.  Gave stat dose of metoprolol.  Earlier given stat dose of digoxin.  Continues to be tachycardic.  Appreciate cardiology input.  Needs stabilization.  Was on warfarin but held in anticipation of surgery/procedure.  *Acute kidney injury over CKD stage III due to hypotension/ATN/infection.  Continue IV fluids.  Monitor input and output.  Repeat labs in the morning.  *Anemia of chronic disease.  Monitor.  *Right knee wound and joint pain.  Concern regarding joint involvement with the superficial cellulitis.  Sent haiku message to Dr. Hyacinth MeekerMiller of orthopedics who will see the patient.  On IV Zosyn.  *Diarrhea-resolved  All the records are reviewed and case discussed with Care Management/Social Worker Management plans discussed with the patient, family and they are in agreement.  CODE STATUS: Partial code.  DNI  DVT Prophylaxis: SCDs  TOTAL TIME TAKING CARE OF THIS PATIENT: 40 minutes.   POSSIBLE D/C IN 3-4 DAYS, DEPENDING ON CLINICAL CONDITION.  Orie FishermanSrikar R Johngabriel Verde M.D on 12/22/2018 at 11:26 AM  Between 7am to 6pm - Pager - [phone]  After 6pm go to [email or web address] - password [email or web address] [facility]  [facility] [place] [facility]  Office  [phone]  CC: Primary care physician; Unice Baileyosenberg, Beth S, MD  Note: This dictation was  prepared with Dragon dictation along with smaller phrase technology. Any transcriptional errors that result from this process are unintentional.

## 2018-12-22 NOTE — Plan of Care (Signed)
Afib RVR continues

## 2018-12-23 ENCOUNTER — Inpatient Hospital Stay: Payer: Medicare (Managed Care)

## 2018-12-23 ENCOUNTER — Encounter: Payer: Self-pay | Admitting: Radiology

## 2018-12-23 DIAGNOSIS — I4891 Unspecified atrial fibrillation: Secondary | ICD-10-CM

## 2018-12-23 DIAGNOSIS — I959 Hypotension, unspecified: Secondary | ICD-10-CM

## 2018-12-23 DIAGNOSIS — D509 Iron deficiency anemia, unspecified: Secondary | ICD-10-CM

## 2018-12-23 DIAGNOSIS — K810 Acute cholecystitis: Secondary | ICD-10-CM

## 2018-12-23 DIAGNOSIS — N179 Acute kidney failure, unspecified: Secondary | ICD-10-CM

## 2018-12-23 HISTORY — PX: IR PERC CHOLECYSTOSTOMY: IMG2326

## 2018-12-23 LAB — COMPREHENSIVE METABOLIC PANEL
ALT: 133 U/L — ABNORMAL HIGH (ref 0–44)
AST: 147 U/L — ABNORMAL HIGH (ref 15–41)
Albumin: 2.4 g/dL — ABNORMAL LOW (ref 3.5–5.0)
Alkaline Phosphatase: 146 U/L — ABNORMAL HIGH (ref 38–126)
Anion gap: 8 (ref 5–15)
BUN: 31 mg/dL — ABNORMAL HIGH (ref 8–23)
CO2: 19 mmol/L — ABNORMAL LOW (ref 22–32)
Calcium: 7.8 mg/dL — ABNORMAL LOW (ref 8.9–10.3)
Chloride: 111 mmol/L (ref 98–111)
Creatinine, Ser: 1.88 mg/dL — ABNORMAL HIGH (ref 0.61–1.24)
GFR calc Af Amer: 39 mL/min — ABNORMAL LOW (ref >60)
GFR calc non Af Amer: 33 mL/min — ABNORMAL LOW (ref >60)
Glucose, Bld: 148 mg/dL — ABNORMAL HIGH (ref 70–99)
Potassium: 4 mmol/L (ref 3.5–5.1)
Sodium: 138 mmol/L (ref 135–145)
Total Bilirubin: 1.3 mg/dL — ABNORMAL HIGH (ref 0.3–1.2)
Total Protein: 6.4 g/dL — ABNORMAL LOW (ref 6.5–8.1)

## 2018-12-23 LAB — CBC WITH DIFFERENTIAL/PLATELET
Abs Immature Granulocytes: 0.14 10*3/uL — ABNORMAL HIGH (ref 0.00–0.07)
Basophils Absolute: 0 10*3/uL (ref 0.0–0.1)
Basophils Relative: 0 %
Eosinophils Absolute: 0.1 10*3/uL (ref 0.0–0.5)
Eosinophils Relative: 0 %
HCT: 27.1 % — ABNORMAL LOW (ref 39.0–52.0)
Hemoglobin: 9 g/dL — ABNORMAL LOW (ref 13.0–17.0)
Immature Granulocytes: 1 %
Lymphocytes Relative: 6 %
Lymphs Abs: 1 10*3/uL (ref 0.7–4.0)
MCH: 31.9 pg (ref 26.0–34.0)
MCHC: 33.2 g/dL (ref 30.0–36.0)
MCV: 96.1 fL (ref 80.0–100.0)
Monocytes Absolute: 2.2 10*3/uL — ABNORMAL HIGH (ref 0.1–1.0)
Monocytes Relative: 13 %
Neutro Abs: 13.2 10*3/uL — ABNORMAL HIGH (ref 1.7–7.7)
Neutrophils Relative %: 80 %
Platelets: 302 10*3/uL (ref 150–400)
RBC: 2.82 MIL/uL — ABNORMAL LOW (ref 4.22–5.81)
RDW: 14.5 % (ref 11.5–15.5)
WBC: 16.6 10*3/uL — ABNORMAL HIGH (ref 4.0–10.5)
nRBC: 0 % (ref 0.0–0.2)

## 2018-12-23 LAB — URINE CULTURE: Culture: >=100000 — AB

## 2018-12-23 MED ORDER — WARFARIN SODIUM 3 MG PO TABS
3.0000 mg | ORAL_TABLET | Freq: Once | ORAL | Status: AC
  Administered 2018-12-23: 3 mg via ORAL
  Filled 2018-12-23: qty 1

## 2018-12-23 MED ORDER — MIDAZOLAM HCL 5 MG/5ML IJ SOLN
INTRAMUSCULAR | Status: AC | PRN
  Administered 2018-12-23: 1 mg via INTRAVENOUS

## 2018-12-23 MED ORDER — WARFARIN - PHARMACIST DOSING INPATIENT: Freq: Every day | Status: DC

## 2018-12-23 MED ORDER — SODIUM CHLORIDE 0.9 % IV SOLN
INTRAVENOUS | Status: AC | PRN
  Administered 2018-12-23: 10 mL/h via INTRAVENOUS

## 2018-12-23 MED ORDER — AMIODARONE HCL IN DEXTROSE 360-4.14 MG/200ML-% IV SOLN
30.0000 mg/h | INTRAVENOUS | Status: DC
  Administered 2018-12-23 – 2018-12-26 (×6): 30 mg/h via INTRAVENOUS
  Filled 2018-12-23 (×5): qty 200

## 2018-12-23 MED ORDER — SENNOSIDES-DOCUSATE SODIUM 8.6-50 MG PO TABS
1.0000 | ORAL_TABLET | Freq: Two times a day (BID) | ORAL | Status: DC
  Administered 2018-12-23 – 2018-12-26 (×6): 1 via ORAL
  Filled 2018-12-23 (×6): qty 1

## 2018-12-23 MED ORDER — AMIODARONE HCL IN DEXTROSE 360-4.14 MG/200ML-% IV SOLN
60.0000 mg/h | INTRAVENOUS | Status: AC
  Administered 2018-12-23: 60 mg/h via INTRAVENOUS
  Filled 2018-12-23 (×2): qty 200

## 2018-12-23 MED ORDER — SODIUM CHLORIDE 0.9% FLUSH
5.0000 mL | Freq: Three times a day (TID) | INTRAVENOUS | Status: DC
  Administered 2018-12-23 – 2018-12-26 (×4): 5 mL

## 2018-12-23 MED ORDER — LIDOCAINE HCL (PF) 1 % IJ SOLN
INTRAMUSCULAR | Status: AC | PRN
  Administered 2018-12-23: 10 mL

## 2018-12-23 MED ORDER — FENTANYL CITRATE (PF) 100 MCG/2ML IJ SOLN
INTRAMUSCULAR | Status: AC | PRN
  Administered 2018-12-23 (×2): 25 ug via INTRAVENOUS

## 2018-12-23 MED ORDER — AMIODARONE LOAD VIA INFUSION
150.0000 mg | Freq: Once | INTRAVENOUS | Status: AC
  Administered 2018-12-23: 150 mg via INTRAVENOUS
  Filled 2018-12-23: qty 83.34

## 2018-12-23 NOTE — Progress Notes (Addendum)
Progress Note  Patient Name: Darrell Hayes Date of Encounter: 12/23/2018  Primary Cardiologist: Julien Nordmannimothy Gollan, MD  Subjective   Remains in afib, 1-teens to 140.  No chest pain or dyspnea.  Biggest complaint this AM is dry mouth as he is NPO and pending percutaneous cholecystostomy drain.  Inpatient Medications    Scheduled Meds:  collagenase   Topical Daily   hydrogen peroxide   Topical BID   levothyroxine  50 mcg Oral QPM   metoprolol tartrate  25 mg Oral BID   sodium chloride flush  3 mL Intravenous Q12H   tamsulosin  0.8 mg Oral QHS   Continuous Infusions:  sodium chloride 250 mL (12/23/18 0133)   0.9 % NaCl with KCl 20 mEq / L 100 mL/hr at 12/23/18 0655   amiodarone     Followed by   amiodarone     piperacillin-tazobactam (ZOSYN)  IV 3.375 g (12/23/18 1008)   PRN Meds: sodium chloride, acetaminophen **OR** acetaminophen, albuterol, HYDROcodone-acetaminophen, morphine injection, ondansetron **OR** ondansetron (ZOFRAN) IV, sodium chloride flush   Vital Signs    Vitals:   12/23/18 0741 12/23/18 1001 12/23/18 1019 12/23/18 1043  BP: (!) 92/57 (!) 98/46 99/60 98/62   Pulse: (!) 106 (!) 120 (!) 116 (!) 122  Resp: 20 18 18    Temp: 97.7 F (36.5 C)     TempSrc: Oral     SpO2: 95% 96% 97%   Weight:      Height:        Intake/Output Summary (Last 24 hours) at 12/23/2018 1120 Last data filed at 12/23/2018 0147 Gross per 24 hour  Intake --  Output 200 ml  Net -200 ml   Filed Weights   12/21/18 0954  Weight: 113.4 kg    Physical Exam   GEN: Well nourished, well developed, in no acute distress.  HEENT: Grossly normal.  Neck: Supple, no JVD, carotid bruits, or masses. Cardiac: IR< IR< tachy, no murmurs, rubs, or gallops. No clubbing, cyanosis, trace RLE edema.  Radials/DP/PT 1+ and equal bilaterally.  Respiratory:  Respirations regular and unlabored, clear to auscultation bilaterally. GI: Soft, diffusely tender, nondistended, BS + x 4. MS: no  deformity or atrophy. Skin: warm and dry, no rash. Neuro:  Mild R hemiparesis. Psych: AAOx3.  Normal affect.  Labs    Chemistry Recent Labs  Lab 12/21/18 0956 12/22/18 0303 12/23/18 0804  NA 133* 137 138  K 3.9 4.2 4.0  CL 97* 110 111  CO2 20* 18* 19*  GLUCOSE 170* 117* 148*  BUN 56* 51* 31*  CREATININE 3.67* 2.72* 1.88*  CALCIUM 8.6* 7.6* 7.8*  PROT 7.5  --  6.4*  ALBUMIN 3.2*  --  2.4*  AST 72*  --  147*  ALT 42  --  133*  ALKPHOS 84  --  146*  BILITOT 1.0  --  1.3*  GFRNONAA 15* 21* 33*  GFRAA 17* 25* 39*  ANIONGAP 16* 9 8     Hematology Recent Labs  Lab 12/21/18 0956 12/22/18 0303 12/23/18 0804  WBC 19.8* 14.5* 16.6*  RBC 3.26* 2.89* 2.82*  HGB 10.5* 9.4* 9.0*  HCT 31.6* 28.3* 27.1*  MCV 96.9 97.9 96.1  MCH 32.2 32.5 31.9  MCHC 33.2 33.2 33.2  RDW 14.4 14.4 14.5  PLT 345 306 302    Cardiac Enzymes Recent Labs  Lab 12/21/18 0956 12/22/18 0303 12/22/18 0912 12/22/18 1814  TROPONINI <0.03 <0.03 <0.03 <0.03      Radiology    Nm Hepatobiliary Liver  Func  Result Date: 12/22/2018 CLINICAL DATA:  Concern for cholecystitis EXAM: NUCLEAR MEDICINE HEPATOBILIARY IMAGING TECHNIQUE: Sequential images of the abdomen were obtained out to 60 minutes following intravenous administration of radiopharmaceutical. RADIOPHARMACEUTICALS:  5.09 mCi Tc-49m Choletec IV with a bump does of 2.1 mCi after morphine. COMPARISON:  None. FINDINGS: Prompt uptake of radiotracer by the liver. Activity passes into the small bowel compatible with patent common bile duct. After 2 hours of imaging, there is non filling of the gallbladder. 4 milligrams of morphine was administered IV. However, additional imaging demonstrates continued non filling of the gallbladder. IMPRESSION: Non filling of the gallbladder despite delayed imaging and morphine administration. Findings compatible with cystic duct obstruction. Common bile duct is patent. Electronically Signed   By: Rolm Baptise M.D.   On:  12/22/2018 20:14   US Venous Img Lower Unilateral Right  Result Date: 12/22/2018 CLINICAL DATA:  Right lower extremity pain and edema. History of injury involving the right lower extremity. Evaluate for DVT. EXAM: RIGHT LOWER EXTREMITY VENOUS DOPPLER ULTRASOUND TECHNIQUE: Gray-scale sonography with graded compression, as well as color Doppler and duplex ultrasound were performed to evaluate the lower extremity deep venous systems from the level of the common femoral vein and including the common femoral, femoral, profunda femoral, popliteal and calf veins including the posterior tibial, peroneal and gastrocnemius veins when visible. The superficial great saphenous vein was also interrogated. Spectral Doppler was utilized to evaluate flow at rest and with distal augmentation maneuvers in the common femoral, femoral and popliteal veins. COMPARISON:  None. FINDINGS: Contralateral Common Femoral Vein: Respiratory phasicity is normal and symmetric with the symptomatic side. No evidence of thrombus. Normal compressibility. Common Femoral Vein: No evidence of thrombus. Normal compressibility, respiratory phasicity and response to augmentation. Saphenofemoral Junction: No evidence of thrombus. Normal compressibility and flow on color Doppler imaging. Profunda Femoral Vein: No evidence of thrombus. Normal compressibility and flow on color Doppler imaging. Femoral Vein: No evidence of thrombus. Normal compressibility, respiratory phasicity and response to augmentation. Popliteal Vein: No evidence of thrombus. Normal compressibility, respiratory phasicity and response to augmentation. Calf Veins: No evidence of thrombus. Normal compressibility and flow on color Doppler imaging. Superficial Great Saphenous Vein: No evidence of thrombus. Normal compressibility. Venous Reflux:  None. Other Findings:  None. IMPRESSION: No evidence of DVT within the right lower extremity. Electronically Signed   By: Sandi Mariscal M.D.   On:  12/22/2018 14:17   US Abdomen Limited Ruq  Result Date: 12/21/2018 CLINICAL DATA:  Right upper quadrant pain. EXAM: ULTRASOUND ABDOMEN LIMITED RIGHT UPPER QUADRANT COMPARISON:  December 21, 2018 CT scan FINDINGS: Gallbladder: There is sludge in the gallbladder. No Murphy's sign. The gallbladder wall thickness was measured at 3 mm. No stones or pericholecystic fluid. Common bile duct: Diameter: 5 mm Liver: Increased echogenicity with no focal mass. Portal vein is patent on color Doppler imaging with normal direction of blood flow towards the liver. IMPRESSION: 1. Gallbladder sludge is identified without Murphy's sign, pericholecystic fluid, stones, or wall thickening. The gallbladder wall was measured at 3 mm which is borderline. I believe the gallbladder wall measurement underestimates the gallbladder wall thickness which appears thicker on CT imaging. Also, there is significant fat stranding centered around a distended gallbladder on the CT scan highly concerning for acute cholecystitis. While the findings on this right upper quadrant ultrasound are not particularly suggestive of acute cholecystitis, the CT scan is very suggestive. If there is continued clinical ambiguity, recommend a HIDA scan. Electronically Signed  By: Gerome Samavid  Williams III M.D   On: 12/21/2018 15:56    Telemetry    Afib, 1-teens to 140 - Personally Reviewed  Patient Profile     78 y.o. 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  Assessment & Plan    1.  Enterococcus UTI/Sepsis:  BPs soft but overall stable.  Abx per IM.  2.  Acute cholecystitis:  Surgery has seen w/ plan for percutaneous drain today.  Abx per IM/GSU.  3.  Afib w/ RVR:  H/o permanent afib, thus elevated rates likely 2/2 acute illness.  Unable to titrate  blocker 2/2  soft bp's.  Reluctant to use more digoxin 2/2 AKI.  Will add amio for rate control in order to get him through procedures and keep rate down until he recovers.  Will have to watch LFT's closely.  Anticoagulation currently on hold pending perc drain.  Can consider DOAC post-op.  Will have to watch for recovery of renal fxn to determine dose (eliquis 5 bid vs xarelto 15 - provided CrCl stays above 30).  4.  AKI:  In setting of #1/2, along w/ diarrhea.  Creat improving w/ hydration.  1.88 this am (3.67 on admission).  Cont to hold acei.  Follow.  5.  R knee abrasion:  Seen by ortho.  Felt to be low risk for knee infxn.  6.  CAD: S/p LAD BMS in 09/2008.  No c/p.  Trop nl.  Cont  blocker. Plan to resume statin once lft's improve.  7.  Carotid arterial dzs:  Needs outpt f/u.  Resume statin once lft's improve.  8.  Diarrhea:  Resolved.   9.  Normocytic anemia:  Relatively stable.  Follow.  Signed, Nicolasa Duckinghristopher Emelin Dascenzo, NP  12/23/2018, 11:20 AM    For questions or updates, please contact   Please consult [email or web address] for contact info under Cardiology/STEMI.

## 2018-12-23 NOTE — Progress Notes (Signed)
[employer] - [place] at [facility]   PATIENT NAME: Darrell SpanLarry Hayes    MR#:  [id number]  DATE OF BIRTH:  [birth year]  SUBJECTIVE:  CHIEF COMPLAINT:   Chief Complaint  Patient presents with  . Code Sepsis   Patient is a poor historian.  Complains of some abdominal pain.  Daughter at bedside. Continues to be tachycardic.  REVIEW OF SYSTEMS:    Review of Systems  Constitutional: Positive for malaise/fatigue. Negative for chills and fever.  HENT: Negative for sore throat.   Eyes: Negative for blurred vision, double vision and pain.  Respiratory: Negative for cough, hemoptysis, shortness of breath and wheezing.   Cardiovascular: Negative for chest pain, palpitations, orthopnea and leg swelling.  Gastrointestinal: Positive for abdominal pain. Negative for constipation, diarrhea, heartburn, nausea and vomiting.  Genitourinary: Negative for dysuria and hematuria.  Musculoskeletal: Positive for joint pain. Negative for back pain.  Skin: Negative for rash.  Neurological: Negative for sensory change, speech change, focal weakness and headaches.  Endo/Heme/Allergies: Does not bruise/bleed easily.  Psychiatric/Behavioral: Negative for depression. The patient is not nervous/anxious.     DRUG ALLERGIES:  No Known Allergies  VITALS:  Blood pressure 100/62, pulse (!) 122, temperature 100.3 F (37.9 C), temperature source Oral, resp. rate 18, height 5\' 8"  (1.727 m), weight 113.4 kg, SpO2 96 %.  PHYSICAL EXAMINATION:   Physical Exam  GENERAL:  78 y.o.-year-old patient lying in the bed with no acute distress.  EYES: Pupils equal, round, reactive to light and accommodation. No scleral icterus. Extraocular muscles intact.  HEENT: Head atraumatic, normocephalic. Oropharynx and nasopharynx clear.  NECK:  Supple, no jugular venous distention. No thyroid enlargement, no tenderness.  LUNGS: Normal breath sounds bilaterally, no wheezing, rales, rhonchi. No use of accessory muscles of  respiration.  CARDIOVASCULAR: S1, S2 normal. No murmurs, rubs, or gallops.  ABDOMEN: Soft, tenderness diffusely, nondistended. Bowel sounds present. No organomegaly or mass.  EXTREMITIES: No cyanosis, clubbing or edema b/l.    NEUROLOGIC: Cranial nerves II through XII are intact. No focal Motor or sensory deficits b/l.   PSYCHIATRIC: The patient is alert and awake SKIN:  Right knee with 5 x 6 open wound with surrounding redness.  Significant tenderness of the knee.  Also has hematoma below and about the knee which seems to be fading away.  LABORATORY PANEL:   CBC Recent Labs  Lab 12/23/18 0804  WBC 16.6*  HGB 9.0*  HCT 27.1*  PLT 302   ------------------------------------------------------------------------------------------------------------------ Chemistries  Recent Labs  Lab 12/22/18 0303 12/23/18 0804  NA 137 138  K 4.2 4.0  CL 110 111  CO2 18* 19*  GLUCOSE 117* 148*  BUN 51* 31*  CREATININE 2.72* 1.88*  CALCIUM 7.6* 7.8*  MG 2.5*  --   AST  --  147*  ALT  --  133*  ALKPHOS  --  146*  BILITOT  --  1.3*   ------------------------------------------------------------------------------------------------------------------  Cardiac Enzymes Recent Labs  Lab 12/22/18 1814  TROPONINI <0.03   ------------------------------------------------------------------------------------------------------------------  RADIOLOGY:  Nm Hepatobiliary Liver Func  Result Date: 12/22/2018 CLINICAL DATA:  Concern for cholecystitis EXAM: NUCLEAR MEDICINE HEPATOBILIARY IMAGING TECHNIQUE: Sequential images of the abdomen were obtained out to 60 minutes following intravenous administration of radiopharmaceutical. RADIOPHARMACEUTICALS:  5.09 mCi Tc-3169m Choletec IV with a bump does of 2.1 mCi after morphine. COMPARISON:  None. FINDINGS: Prompt uptake of radiotracer by the liver. Activity passes into the small bowel compatible with patent common bile duct. After 2 hours of imaging, there is  non  filling of the gallbladder. 4 milligrams of morphine was administered IV. However, additional imaging demonstrates continued non filling of the gallbladder. IMPRESSION: Non filling of the gallbladder despite delayed imaging and morphine administration. Findings compatible with cystic duct obstruction. Common bile duct is patent. Electronically Signed   By: Rolm Baptise M.D.   On: 12/22/2018 20:14   US Venous Img Lower Unilateral Right  Result Date: 12/22/2018 CLINICAL DATA:  Right lower extremity pain and edema. History of injury involving the right lower extremity. Evaluate for DVT. EXAM: RIGHT LOWER EXTREMITY VENOUS DOPPLER ULTRASOUND TECHNIQUE: Gray-scale sonography with graded compression, as well as color Doppler and duplex ultrasound were performed to evaluate the lower extremity deep venous systems from the level of the common femoral vein and including the common femoral, femoral, profunda femoral, popliteal and calf veins including the posterior tibial, peroneal and gastrocnemius veins when visible. The superficial great saphenous vein was also interrogated. Spectral Doppler was utilized to evaluate flow at rest and with distal augmentation maneuvers in the common femoral, femoral and popliteal veins. COMPARISON:  None. FINDINGS: Contralateral Common Femoral Vein: Respiratory phasicity is normal and symmetric with the symptomatic side. No evidence of thrombus. Normal compressibility. Common Femoral Vein: No evidence of thrombus. Normal compressibility, respiratory phasicity and response to augmentation. Saphenofemoral Junction: No evidence of thrombus. Normal compressibility and flow on color Doppler imaging. Profunda Femoral Vein: No evidence of thrombus. Normal compressibility and flow on color Doppler imaging. Femoral Vein: No evidence of thrombus. Normal compressibility, respiratory phasicity and response to augmentation. Popliteal Vein: No evidence of thrombus. Normal compressibility, respiratory  phasicity and response to augmentation. Calf Veins: No evidence of thrombus. Normal compressibility and flow on color Doppler imaging. Superficial Great Saphenous Vein: No evidence of thrombus. Normal compressibility. Venous Reflux:  None. Other Findings:  None. IMPRESSION: No evidence of DVT within the right lower extremity. Electronically Signed   By: Sandi Mariscal M.D.   On: 12/22/2018 14:17   US Abdomen Limited Ruq  Result Date: 12/21/2018 CLINICAL DATA:  Right upper quadrant pain. EXAM: ULTRASOUND ABDOMEN LIMITED RIGHT UPPER QUADRANT COMPARISON:  December 21, 2018 CT scan FINDINGS: Gallbladder: There is sludge in the gallbladder. No Murphy's sign. The gallbladder wall thickness was measured at 3 mm. No stones or pericholecystic fluid. Common bile duct: Diameter: 5 mm Liver: Increased echogenicity with no focal mass. Portal vein is patent on color Doppler imaging with normal direction of blood flow towards the liver. IMPRESSION: 1. Gallbladder sludge is identified without Murphy's sign, pericholecystic fluid, stones, or wall thickening. The gallbladder wall was measured at 3 mm which is borderline. I believe the gallbladder wall measurement underestimates the gallbladder wall thickness which appears thicker on CT imaging. Also, there is significant fat stranding centered around a distended gallbladder on the CT scan highly concerning for acute cholecystitis. While the findings on this right upper quadrant ultrasound are not particularly suggestive of acute cholecystitis, the CT scan is very suggestive. If there is continued clinical ambiguity, recommend a HIDA scan. Electronically Signed   By: Dorise Bullion III M.D   On: 12/21/2018 15:56     ASSESSMENT AND PLAN:   *Acute cholecystitis.     On IV Zosyn HIDA scan with no dye entry into the cystic duct.  Discussed with surgical team.  Scheduled for percutaneous cholecystostomy through IR.  *UTI -on IV Zosyn.  Cultures pending  *Sepsis present on  admission due to acute infection.  Improving.  *Atrial fibrillation with rapid ventricular rate.  Started on metoprolol.  But continues to be tachycardic.  Discussed with cardiology.  Will start amiodarone drip. Off anticoagulation while waiting for his cholecystostomy procedure  *Acute kidney injury over CKD stage III due to hypotension/ATN/infection.  Continue IV fluids.  Monitor input and output.  Repeat labs in the morning.  *Anemia of chronic disease.  Monitor.  *Right knee wound and joint pain Orthopedics consulted.  Appreciate Dr. Rondel BatonMiller's input.  Advise dressing changes.  *Diarrhea-resolved  All the records are reviewed and case discussed with Care Management/Social Worker Management plans discussed with the patient, family and they are in agreement.  CODE STATUS: Partial code.  DNI  DVT Prophylaxis: SCDs  TOTAL TIME TAKING CARE OF THIS PATIENT: 40 minutes.   POSSIBLE D/C IN 3-4 DAYS, DEPENDING ON CLINICAL CONDITION.  Orie FishermanSrikar R Ananda Caya M.D on 12/23/2018 at 12:51 PM  Between 7am to 6pm - Pager - [phone]  After 6pm go to [email or web address] - password [email or web address] [facility]  [facility]  Office  [phone]  CC: Primary care physician; Unice Baileyosenberg, Beth S, MD  Note: This dictation was prepared with Dragon dictation along with smaller phrase technology. Any transcriptional errors that result from this process are unintentional.

## 2018-12-23 NOTE — Progress Notes (Addendum)
[facility] SURGICAL PROGRESS NOTE (cpt [phone])  Hospital Day(s): 2.   Post op day(s):  Darrell Hayes   Interval History: Patient seen and examined, history of dementia limits history. Leukocytosis up to 16.6K this morning. HIDA consistent with acute cholecystitis and pending IR placement of percutaneous cholecystostomy tube.   Review of Systems:  Unable to preform secondary to dementia   Vital signs in last 24 hours: [min-max] current  Temp:  [97.7 F (36.5 C)-99.4 F (37.4 C)] 97.7 F (36.5 C) (06/19 0741) Pulse Rate:  [56-128] 106 (06/19 0741) Resp:  [18-20] 20 (06/19 0741) BP: (92-110)/(56-75) 92/57 (06/19 0741) SpO2:  [80 %-99 %] 95 % (06/19 0741)     Height: 5\' 8"  (172.7 cm) Weight: 113.4 kg BMI (Calculated): 38.02   Intake/Output last 2 shifts:  06/18 0701 - 06/19 0700 In: -  Out: 200 [Urine:200]   Physical Exam:  Constitutional: alert HENT: normocephalic without obvious abnormality  Eyes: PERRL, EOM's grossly intact and symmetric  Respiratory: breathing non-labored at rest  Cardiovascular: Tachycardia, irregularly irregular Gastrointestinal: soft, RUQ tenderness, and non-distended. No rebound/guarding.   Labs:  CBC Latest Ref Rng & Units 12/22/2018 12/21/2018 09/22/2013  WBC 4.0 - 10.5 K/uL 14.5(H) 19.8(H) 10.8(H)  Hemoglobin 13.0 - 17.0 g/dL 9.4(L) 10.5(L) 14.2  Hematocrit 39.0 - 52.0 % 28.3(L) 31.6(L) 42.0  Platelets 150 - 400 K/uL 306 345 204   CMP Latest Ref Rng & Units 12/22/2018 12/21/2018 09/22/2013  Glucose 70 - 99 mg/dL 117(H) 170(H) 105(H)  BUN 8 - 23 mg/dL 51(H) 56(H) 12  Creatinine 0.61 - 1.24 mg/dL 2.72(H) 3.67(H) 1.21  Sodium 135 - 145 mmol/L 137 133(L) 137  Potassium 3.5 - 5.1 mmol/L 4.2 3.9 4.2  Chloride 98 - 111 mmol/L 110 97(L) 106  CO2 22 - 32 mmol/L 18(L) 20(L) 28  Calcium 8.9 - 10.3 mg/dL 7.6(L) 8.6(L) 8.4(L)  Total Protein 6.5 - 8.1 g/dL - 7.5 7.4  Total Bilirubin 0.3 - 1.2 mg/dL - 1.0 0.3  Alkaline Phos 38 - 126 U/L - 84 134(H)  AST  15 - 41 U/L - 72(H) 22  ALT 0 - 44 U/L - 42 17     Imaging studies:   HIDA (12/22/2018) personally reviewed and radiologist report reviewed: IMPRESSION: 1) Non filling of the gallbladder despite delayed imaging and morphine administration. Findings compatible with cystic duct obstruction. 2) Common bile duct is patent.   Assessment/Plan: (ICD-10's: K81.00) 78 y.o. male with RUQ pain and leukocytosis most likely attributable to acute cholecystitis seen on HIDA yesterday otherwise complicated by pertinent comorbidities including Afib with RVR with supratherapeutic INR level on arrival which is corrected as well as improving AKI, intermittent hypotension, Diarrhea pending C diff, and a history of HTN, HLD, dementia following CVA, and advanced age.    - NPO + IVF for procedure; can consider clear liquids after procedure if pain controlled   - Continue IV Abx (Zosyn)  - pain control prn; antiemetics prn  - monitor abdominal examination  - Poor surgical candidate; Will consult IR for cholecystostomy tube placement today  - trend leukocytosis, renal function   - further management per primary and consulting teams; appreciate their help    All of the above findings and recommendations were discussed with the patient, patient's family (daughter), and the medical team, and all of patient's and family's questions were answered to their expressed satisfaction.  -- Edison Simon, PA-C [facility] 12/23/2018, 8:16 AM [phone] M-F: 7am - 4pm  Patient off floor for procedure  this morning.  I agree with the above documentation, which I have edited where appropriate. Duanne GuessJennifer Niaja Stickley  11:30 AM

## 2018-12-23 NOTE — Procedures (Signed)
Interventional Radiology Procedure Note  Procedure:  Image guided percutaneous cholecystostomy tube. .  Complications: None Recommendations:  - To gravity - Do not submerge - Routine care   Signed,  Dulcy Fanny. Earleen Newport, DO

## 2018-12-23 NOTE — Care Management Important Message (Signed)
Important Message  Patient Details  Name: Darrell Hayes MRN: [id number] Date of Birth: 10/25/1940   Medicare Important Message Given:  Yes  Initial Medicare IM given by Patient Access Associate on 12/22/2018 at 1055.  Still valid.   Dannette Barbara 12/23/2018, 9:23 AM

## 2018-12-23 NOTE — Consult Note (Signed)
ANTICOAGULATION CONSULT NOTE - Initial Consult  Pharmacy Consult for Warfarin Indication: Post IR Procedure Consult - Anticoagulant/Antiplatelet PTA/Inpatient Med List Review by Pharmacist for afib.   No Known Allergies  Patient Measurements: Height: 5\' 8"  (172.7 cm) Weight: 250 lb (113.4 kg) IBW/kg (Calculated) : 68.4  Vital Signs: Temp: 100.3 F (37.9 C) (06/19 1136) Temp Source: Oral (06/19 1136) BP: 98/62 (06/19 1357) Pulse Rate: 105 (06/19 1357)  Labs: Recent Labs    12/21/18 0956 12/22/18 0303 12/22/18 0912 12/22/18 1814 12/23/18 0804  HGB 10.5* 9.4*  --   --  9.0*  HCT 31.6* 28.3*  --   --  27.1*  PLT 345 306  --   --  302  LABPROT 49.7* 16.6*  --   --   --   INR 5.6* 1.4*  --   --   --   CREATININE 3.67* 2.72*  --   --  1.88*  TROPONINI <0.03 <0.03 <0.03 <0.03  --     Estimated Creatinine Clearance: 39.6 mL/min (A) (by C-G formula based on SCr of 1.88 mg/dL (H)).   Medical History: Past Medical History:  Diagnosis Date  . Agitation    and irritability, situational exacerbation  . Alveolar/parietoalveolar pneumonopathy, other    06/14/09 CT chest, Mild LLL GGO; 02/28/10 CT chest , improved  . Atrial fibrillation (HCC)    Coumadin therapy - Dr. Jens Somrenshaw.  Cardioversion 08/23/09, Amiodarone Rx since 09/03/2009  . CAD (coronary artery disease) 09/2008   Dr. Jens Somrenshaw, stent to LAD  . CVA (cerebral vascular accident) (HCC) 2007   In the setting of atrial fibrillation  . Dyspnea 09/2008   Class 3, since 2008.  Significant tachycardia.  No PE or fibrosis on CT chest 3/10, 12/10, 02/2010, normal PFT but isolated low DLCO  06/2009, normal hgb, TSH, creat and albumin , Dec 2010.  Resolved after A Fib cardioversion and Amiodarone Marh 2011  . Hepatitis   . Hiatal hernia   . History of chickenpox   . Hyperlipidemia   . Hypertension   . Nonspecific abnormal results of pulmonary system function study   . Pulmonary nodules 09/2008   Nonspecific reticulonodular nodules RLL  superior segment, resolved on CT 06/14/09  . Seizures (HCC) 12/2008   Dr. Sharene SkeansHickling    Medications:  Medications Prior to Admission  Medication Sig Dispense Refill Last Dose  . albuterol (PROVENTIL) (2.5 MG/3ML) 0.083% nebulizer solution Take 2.5 mg by nebulization every 4 (four) hours as needed for wheezing or shortness of breath.   prn at prn  . cholecalciferol (VITAMIN D) 1000 UNITS tablet Take 1,000 Units by mouth daily.     12/20/2018 at 2000  . levothyroxine (SYNTHROID) 50 MCG tablet Take 50 mcg by mouth every evening.   12/20/2018 at 1800  . lisinopril (ZESTRIL) 2.5 MG tablet Take 2.5 mg by mouth at bedtime.   12/20/2018 at 2000  . metoprolol tartrate (LOPRESSOR) 25 MG tablet Take 0.5 tablets (12.5 mg total) by mouth daily. 45 tablet 3 12/20/2018 at 1800  . rosuvastatin (CRESTOR) 20 MG tablet Take 1 tablet (20 mg total) by mouth daily. (Patient taking differently: Take 10 mg by mouth every evening. ) 90 tablet 3 12/20/2018 at 1800  . tamsulosin (FLOMAX) 0.4 MG CAPS capsule Take 0.8 mg by mouth at bedtime.   12/20/2018 at 2000  . warfarin (COUMADIN) 1 MG tablet Take 3 and a half tablets by mouth daily (or as directed) (Patient taking differently: Take 3-4 mg by mouth daily at 6  PM. Take 3 mg by mouth on Sunday, Tuesday, Thursday, Friday and Saturday. Take 4 mg on Monday and Wednesday) 100 tablet 3 12/20/2018 at 1800  . amiodarone (PACERONE) 200 MG tablet Take 1 tablet (200 mg total) by mouth daily. (Patient not taking: Reported on 12/21/2018) 90 tablet 3 Not Taking at Unknown time  . carbamazepine (TEGRETOL) 200 MG tablet Take 1 1/2 tabs by mouth twice daily (Patient not taking: Reported on 12/21/2018) 270 tablet 3 Not Taking at Unknown time  . docusate sodium (COLACE) 100 MG capsule Take 200 mg by mouth daily.     prn at prn  . levothyroxine (LEVOTHROID) 25 MCG tablet Take 1 tablet (25 mcg total) by mouth daily. 90 tablet 3   . LORazepam (ATIVAN) 0.5 MG tablet Take one half tablet by mouth daily.  (Patient not taking: Reported on 12/21/2018) 45 tablet 1 Not Taking at Unknown time  . QUEtiapine (SEROQUEL) 25 MG tablet Take 1 tablet (25 mg total) by mouth 2 (two) times daily. (Patient not taking: Reported on 12/21/2018) 180 tablet 1 Not Taking at Unknown time  . sertraline (ZOLOFT) 25 MG tablet Take 1 tablet (25 mg total) by mouth daily. (Patient not taking: Reported on 12/21/2018) 90 tablet 3 Not Taking at Unknown time  . sorbitol 70 % solution Take 2 tablespoonfuls every two hours as needed.    prn at prn   Scheduled:  . collagenase   Topical Daily  . hydrogen peroxide   Topical BID  . levothyroxine  50 mcg Oral QPM  . metoprolol tartrate  25 mg Oral BID  . senna-docusate  1 tablet Oral BID  . sodium chloride flush  3 mL Intravenous Q12H  . sodium chloride flush  5 mL Intracatheter Q8H  . tamsulosin  0.8 mg Oral QHS   Infusions:  . sodium chloride 250 mL (12/23/18 0133)  . 0.9 % NaCl with KCl 20 mEq / L 100 mL/hr at 12/23/18 0655  . amiodarone 60 mg/hr (12/23/18 1343)   Followed by  . amiodarone    . piperacillin-tazobactam (ZOSYN)  IV 3.375 g (12/23/18 1008)   PRN: sodium chloride, acetaminophen **OR** acetaminophen, albuterol, HYDROcodone-acetaminophen, morphine injection, ondansetron **OR** ondansetron (ZOFRAN) IV, sodium chloride flush Anti-infectives (From admission, onward)   Start     Dose/Rate Route Frequency Ordered Stop   12/21/18 2200  piperacillin-tazobactam (ZOSYN) IVPB 3.375 g     3.375 g 12.5 mL/hr over 240 Minutes Intravenous Every 8 hours 12/21/18 1522     12/21/18 1145  piperacillin-tazobactam (ZOSYN) IVPB 3.375 g     3.375 g 100 mL/hr over 30 Minutes Intravenous  Once 12/21/18 1144 12/21/18 1402   12/21/18 1115  metroNIDAZOLE (FLAGYL) tablet 500 mg     50 0 mg Oral  Once 12/21/18 1104 12/21/18 1347      Assessment: Pharmacy was consulted to restart anticoagulant. Patient was admitted with an INR of 5.6 and received Vit K on 6/17 for percutaneous  cholecystostomy with IR.  Home dose: Take 3 mg by mouth on Sunday, Tuesday, Thursday, Friday and Saturday. Take 4 mg on Monday and Wednesday.   DDI: pt has been started on amiodarone.   Date INR Warfarin Dose  6/17 5.6 Held  6/18 1.4 Held  6/19  3 mg (ordered)         Goal of Therapy:  INR 2-3 Monitor platelets by anticoagulation protocol: Yes   Plan:   INR is subtherapeutic. Will order a 3 mg dose for tonight. Pt received Vit  K, so it may take a couple of days for the warfarin to overcome its effects. DDI with amiodarone, which can increase INR. Pt is on abx (pip/tazo). Daily INR ordered. CBC stable. Order CBC every 3 days. Pharmacy will continue to follow and monitor.   Oswald Hillock, PharmD, BCPS 12/23/2018,2:07 PM

## 2018-12-24 LAB — COMPREHENSIVE METABOLIC PANEL
ALT: 92 U/L — ABNORMAL HIGH (ref 0–44)
AST: 73 U/L — ABNORMAL HIGH (ref 15–41)
Albumin: 2.2 g/dL — ABNORMAL LOW (ref 3.5–5.0)
Alkaline Phosphatase: 131 U/L — ABNORMAL HIGH (ref 38–126)
Anion gap: 8 (ref 5–15)
BUN: 23 mg/dL (ref 8–23)
CO2: 20 mmol/L — ABNORMAL LOW (ref 22–32)
Calcium: 7.7 mg/dL — ABNORMAL LOW (ref 8.9–10.3)
Chloride: 108 mmol/L (ref 98–111)
Creatinine, Ser: 1.57 mg/dL — ABNORMAL HIGH (ref 0.61–1.24)
GFR calc Af Amer: 48 mL/min — ABNORMAL LOW (ref >60)
GFR calc non Af Amer: 42 mL/min — ABNORMAL LOW (ref >60)
Glucose, Bld: 150 mg/dL — ABNORMAL HIGH (ref 70–99)
Potassium: 3.8 mmol/L (ref 3.5–5.1)
Sodium: 136 mmol/L (ref 135–145)
Total Bilirubin: 0.8 mg/dL (ref 0.3–1.2)
Total Protein: 6.1 g/dL — ABNORMAL LOW (ref 6.5–8.1)

## 2018-12-24 LAB — PROTIME-INR
INR: 2.1 — ABNORMAL HIGH (ref 0.8–1.2)
Prothrombin Time: 23.3 seconds — ABNORMAL HIGH (ref 11.4–15.2)

## 2018-12-24 LAB — CBC WITH DIFFERENTIAL/PLATELET
Abs Immature Granulocytes: 0.14 10*3/uL — ABNORMAL HIGH (ref 0.00–0.07)
Basophils Absolute: 0 10*3/uL (ref 0.0–0.1)
Basophils Relative: 0 %
Eosinophils Absolute: 0.2 10*3/uL (ref 0.0–0.5)
Eosinophils Relative: 2 %
HCT: 27.7 % — ABNORMAL LOW (ref 39.0–52.0)
Hemoglobin: 9.1 g/dL — ABNORMAL LOW (ref 13.0–17.0)
Immature Granulocytes: 1 %
Lymphocytes Relative: 11 %
Lymphs Abs: 1.2 10*3/uL (ref 0.7–4.0)
MCH: 32 pg (ref 26.0–34.0)
MCHC: 32.9 g/dL (ref 30.0–36.0)
MCV: 97.5 fL (ref 80.0–100.0)
Monocytes Absolute: 1.3 10*3/uL — ABNORMAL HIGH (ref 0.1–1.0)
Monocytes Relative: 12 %
Neutro Abs: 8 10*3/uL — ABNORMAL HIGH (ref 1.7–7.7)
Neutrophils Relative %: 74 %
Platelets: 293 10*3/uL (ref 150–400)
RBC: 2.84 MIL/uL — ABNORMAL LOW (ref 4.22–5.81)
RDW: 14.6 % (ref 11.5–15.5)
WBC: 10.7 10*3/uL — ABNORMAL HIGH (ref 4.0–10.5)
nRBC: 0 % (ref 0.0–0.2)

## 2018-12-24 MED ORDER — WARFARIN SODIUM 1 MG PO TABS
1.0000 mg | ORAL_TABLET | Freq: Once | ORAL | Status: AC
  Administered 2018-12-24: 17:00:00 1 mg via ORAL
  Filled 2018-12-24: qty 1

## 2018-12-24 MED ORDER — SODIUM CHLORIDE 0.9 % IV SOLN
3.0000 g | Freq: Four times a day (QID) | INTRAVENOUS | Status: DC
  Administered 2018-12-24 – 2018-12-26 (×6): 3 g via INTRAVENOUS
  Filled 2018-12-24 (×9): qty 3

## 2018-12-24 NOTE — Plan of Care (Signed)
  Problem: Clinical Measurements: Goal: Signs and symptoms of infection will decrease Outcome: Progressing   Problem: Clinical Measurements: Goal: Will remain free from infection Outcome: Progressing   Problem: Pain Managment: Goal: General experience of comfort will improve Outcome: Progressing   Problem: Safety: Goal: Ability to remain free from injury will improve Outcome: Progressing   Problem: Education: Goal: Understanding of medication regimen will improve Outcome: Progressing

## 2018-12-24 NOTE — Consult Note (Signed)
ANTICOAGULATION CONSULT NOTE   Pharmacy Consult for Warfarin Indication: Post IR Procedure Consult - Anticoagulant/Antiplatelet PTA/Inpatient Med List Review by Pharmacist for afib.   No Known Allergies  Patient Measurements: Height: 5\' 8"  (172.7 cm) Weight: 250 lb (113.4 kg) IBW/kg (Calculated) : 68.4  Vital Signs: Temp: 97.5 F (36.4 C) (06/20 0917) Temp Source: Oral (06/20 0917) BP: 123/64 (06/20 0917) Pulse Rate: 52 (06/20 0917)  Labs: Recent Labs    12/21/18 0956 12/22/18 0303 12/22/18 0912 12/22/18 1814 12/23/18 0804 12/24/18 0549  HGB 10.5* 9.4*  --   --  9.0* 9.1*  HCT 31.6* 28.3*  --   --  27.1* 27.7*  PLT 345 306  --   --  302 293  LABPROT 49.7* 16.6*  --   --   --  23.3*  INR 5.6* 1.4*  --   --   --  2.1*  CREATININE 3.67* 2.72*  --   --  1.88* 1.57*  TROPONINI <0.03 <0.03 <0.03 <0.03  --   --     Estimated Creatinine Clearance: 47.4 mL/min (A) (by C-G formula based on SCr of 1.57 mg/dL (H)).   Medical History: Past Medical History:  Diagnosis Date  . Agitation    and irritability, situational exacerbation  . Alveolar/parietoalveolar pneumonopathy, other    06/14/09 CT chest, Mild LLL GGO; 02/28/10 CT chest , improved  . Atrial fibrillation (HCC)    Coumadin therapy - Dr. Jens Somrenshaw.  Cardioversion 08/23/09, Amiodarone Rx since 09/03/2009  . CAD (coronary artery disease) 09/2008   Dr. Jens Somrenshaw, stent to LAD  . CVA (cerebral vascular accident) (HCC) 2007   In the setting of atrial fibrillation  . Dyspnea 09/2008   Class 3, since 2008.  Significant tachycardia.  No PE or fibrosis on CT chest 3/10, 12/10, 02/2010, normal PFT but isolated low DLCO  06/2009, normal hgb, TSH, creat and albumin , Dec 2010.  Resolved after A Fib cardioversion and Amiodarone Marh 2011  . Hepatitis   . Hiatal hernia   . History of chickenpox   . Hyperlipidemia   . Hypertension   . Nonspecific abnormal results of pulmonary system function study   . Pulmonary nodules 09/2008   Nonspecific reticulonodular nodules RLL superior segment, resolved on CT 06/14/09  . Seizures (HCC) 12/2008   Dr. Sharene SkeansHickling    Medications:  Medications Prior to Admission  Medication Sig Dispense Refill Last Dose  . albuterol (PROVENTIL) (2.5 MG/3ML) 0.083% nebulizer solution Take 2.5 mg by nebulization every 4 (four) hours as needed for wheezing or shortness of breath.   prn at prn  . cholecalciferol (VITAMIN D) 1000 UNITS tablet Take 1,000 Units by mouth daily.     12/20/2018 at 2000  . levothyroxine (SYNTHROID) 50 MCG tablet Take 50 mcg by mouth every evening.   12/20/2018 at 1800  . lisinopril (ZESTRIL) 2.5 MG tablet Take 2.5 mg by mouth at bedtime.   12/20/2018 at 2000  . metoprolol tartrate (LOPRESSOR) 25 MG tablet Take 0.5 tablets (12.5 mg total) by mouth daily. 45 tablet 3 12/20/2018 at 1800  . rosuvastatin (CRESTOR) 20 MG tablet Take 1 tablet (20 mg total) by mouth daily. (Patient taking differently: Take 10 mg by mouth every evening. ) 90 tablet 3 12/20/2018 at 1800  . tamsulosin (FLOMAX) 0.4 MG CAPS capsule Take 0.8 mg by mouth at bedtime.   12/20/2018 at 2000  . warfarin (COUMADIN) 1 MG tablet Take 3 and a half tablets by mouth daily (or as directed) (Patient taking differently:  Take 3-4 mg by mouth daily at 6 PM. Take 3 mg by mouth on Sunday, Tuesday, Thursday, Friday and Saturday. Take 4 mg on Monday and Wednesday) 100 tablet 3 12/20/2018 at 1800  . amiodarone (PACERONE) 200 MG tablet Take 1 tablet (200 mg total) by mouth daily. (Patient not taking: Reported on 12/21/2018) 90 tablet 3 Not Taking at Unknown time  . carbamazepine (TEGRETOL) 200 MG tablet Take 1 1/2 tabs by mouth twice daily (Patient not taking: Reported on 12/21/2018) 270 tablet 3 Not Taking at Unknown time  . docusate sodium (COLACE) 100 MG capsule Take 200 mg by mouth daily.     prn at prn  . levothyroxine (LEVOTHROID) 25 MCG tablet Take 1 tablet (25 mcg total) by mouth daily. 90 tablet 3   . LORazepam (ATIVAN) 0.5 MG tablet  Take one half tablet by mouth daily. (Patient not taking: Reported on 12/21/2018) 45 tablet 1 Not Taking at Unknown time  . QUEtiapine (SEROQUEL) 25 MG tablet Take 1 tablet (25 mg total) by mouth 2 (two) times daily. (Patient not taking: Reported on 12/21/2018) 180 tablet 1 Not Taking at Unknown time  . sertraline (ZOLOFT) 25 MG tablet Take 1 tablet (25 mg total) by mouth daily. (Patient not taking: Reported on 12/21/2018) 90 tablet 3 Not Taking at Unknown time  . sorbitol 70 % solution Take 2 tablespoonfuls every two hours as needed.    prn at prn   Scheduled:  . collagenase   Topical Daily  . hydrogen peroxide   Topical BID  . levothyroxine  50 mcg Oral QPM  . metoprolol tartrate  25 mg Oral BID  . senna-docusate  1 tablet Oral BID  . sodium chloride flush  3 mL Intravenous Q12H  . sodium chloride flush  5 mL Intracatheter Q8H  . tamsulosin  0.8 mg Oral QHS  . warfarin  1 mg Oral ONCE-1800  . Warfarin - Pharmacist Dosing Inpatient   Does not apply q1800   Infusions:  . sodium chloride 250 mL (12/23/18 0133)  . 0.9 % NaCl with KCl 20 mEq / L 100 mL/hr at 12/24/18 0046  . amiodarone 30 mg/hr (12/23/18 2232)  . piperacillin-tazobactam (ZOSYN)  IV 3.375 g (12/24/18 0336)   PRN: sodium chloride, acetaminophen **OR** acetaminophen, albuterol, HYDROcodone-acetaminophen, morphine injection, ondansetron **OR** ondansetron (ZOFRAN) IV, sodium chloride flush Anti-infectives (From admission, onward)   Start     Dose/Rate Route Frequency Ordered Stop   12/21/18 2200  piperacillin-tazobactam (ZOSYN) IVPB 3.375 g     3.375 g 12.5 mL/hr over 240 Minutes Intravenous Every 8 hours 12/21/18 1522     12/21/18 1145  piperacillin-tazobactam (ZOSYN) IVPB 3.375 g     3.375 g 100 mL/hr over 30 Minutes Intravenous  Once 12/21/18 1144 12/21/18 1402   12/21/18 1115  metroNIDAZOLE (FLAGYL) tablet 500 mg     50 0 mg Oral  Once 12/21/18 1104 12/21/18 1347      Assessment: Pharmacy was consulted to restart  anticoagulant. Patient was admitted with an INR of 5.6 and received Vit K on 6/17 for percutaneous cholecystostomy with IR.  Home dose: Take 3 mg by mouth on Sunday, Tuesday, Thursday, Friday and Saturday. Take 4 mg on Monday and Wednesday.   DDI: pt has been started on amiodarone.   Date INR Warfarin Dose  6/17 5.6 Held  6/18 1.4 Held  6/19  3 mg (ordered)  6/20 2.1 1 mg     Goal of Therapy:  INR 2-3 Monitor platelets by anticoagulation  protocol: Yes   Plan:  INR increased from 1.4 to 2.1 after one dose.Will order warfarin 1mg  PO x 1 on 6/20. Would expect have expected more warfarin resistance post vitamin K administration. Patient is also on concurrent Zosyn and amiodarone.   Pharmacy will continue to monitor and adjust per consult.   , L, RPh 12/24/2018,9:49 AM

## 2018-12-24 NOTE — Progress Notes (Signed)
Progress Note  Patient Name: Darrell Hayes Date of Encounter: 12/24/2018  Primary Cardiologist: Julien Nordmannimothy Gollan, MD   Subjective   Feels better today. Drain placed yesterday. No pain or dyspnea. Complaining about hospital food.   Inpatient Medications    Scheduled Meds: . collagenase   Topical Daily  . hydrogen peroxide   Topical BID  . levothyroxine  50 mcg Oral QPM  . metoprolol tartrate  25 mg Oral BID  . senna-docusate  1 tablet Oral BID  . sodium chloride flush  3 mL Intravenous Q12H  . sodium chloride flush  5 mL Intracatheter Q8H  . tamsulosin  0.8 mg Oral QHS  . Warfarin - Pharmacist Dosing Inpatient   Does not apply q1800   Continuous Infusions: . sodium chloride 250 mL (12/23/18 0133)  . 0.9 % NaCl with KCl 20 mEq / L 100 mL/hr at 12/24/18 0046  . amiodarone 30 mg/hr (12/23/18 2232)  . piperacillin-tazobactam (ZOSYN)  IV 3.375 g (12/24/18 0336)   PRN Meds: sodium chloride, acetaminophen **OR** acetaminophen, albuterol, HYDROcodone-acetaminophen, morphine injection, ondansetron **OR** ondansetron (ZOFRAN) IV, sodium chloride flush   Vital Signs    Vitals:   12/23/18 1924 12/23/18 2306 12/24/18 0051 12/24/18 0337  BP: 101/64 104/70 110/74 121/76  Pulse: 67 (!) 102 97 (!) 105  Resp: 18   18  Temp: 98.7 F (37.1 C) 98.5 F (36.9 C) 98.5 F (36.9 C) 97.6 F (36.4 C)  TempSrc: Oral Oral Oral Oral  SpO2: 95% 97% 96% 96%  Weight:      Height:        Intake/Output Summary (Last 24 hours) at 12/24/2018 0831 Last data filed at 12/24/2018 0650 Gross per 24 hour  Intake 1010.69 ml  Output 165 ml  Net 845.69 ml   Last 3 Weights 12/21/2018 11/27/2010 09/15/2010  Weight (lbs) 250 lb 191 lb 12.8 oz 203 lb  Weight (kg) 113.399 kg 87 kg 92.08 kg      Telemetry    Atrial fib, rate 80s to 100- Personally Reviewed  ECG    No AM EKG- Personally Reviewed  Physical Exam   GEN: No acute distress.   Neck: No JVD Cardiac: Irreg irreg.   Respiratory: Clear to  auscultation bilaterally. GI: Soft, nontender, non-distended  MS: No edema; No deformity. Neuro:  Nonfocal  Psych: Normal affect   Labs    Chemistry Recent Labs  Lab 12/21/18 0956 12/22/18 0303 12/23/18 0804 12/24/18 0549  NA 133* 137 138 136  K 3.9 4.2 4.0 3.8  CL 97* 110 111 108  CO2 20* 18* 19* 20*  GLUCOSE 170* 117* 148* 150*  BUN 56* 51* 31* 23  CREATININE 3.67* 2.72* 1.88* 1.57*  CALCIUM 8.6* 7.6* 7.8* 7.7*  PROT 7.5  --  6.4* 6.1*  ALBUMIN 3.2*  --  2.4* 2.2*  AST 72*  --  147* 73*  ALT 42  --  133* 92*  ALKPHOS 84  --  146* 131*  BILITOT 1.0  --  1.3* 0.8  GFRNONAA 15* 21* 33* 42*  GFRAA 17* 25* 39* 48*  ANIONGAP 16* 9 8 8      Hematology Recent Labs  Lab 12/22/18 0303 12/23/18 0804 12/24/18 0549  WBC 14.5* 16.6* 10.7*  RBC 2.89* 2.82* 2.84*  HGB 9.4* 9.0* 9.1*  HCT 28.3* 27.1* 27.7*  MCV 97.9 96.1 97.5  MCH 32.5 31.9 32.0  MCHC 33.2 33.2 32.9  RDW 14.4 14.5 14.6  PLT 306 302 293    Cardiac Enzymes  Recent Labs  Lab 12/21/18 0956 12/22/18 0303 12/22/18 0912 12/22/18 1814  TROPONINI <0.03 <0.03 <0.03 <0.03   No results for input(s): TROPIPOC in the last 168 hours.   BNPNo results for input(s): BNP, PROBNP in the last 168 hours.   DDimer No results for input(s): DDIMER in the last 168 hours.   Radiology    Nm Hepatobiliary Liver Func  Result Date: 12/22/2018 CLINICAL DATA:  Concern for cholecystitis EXAM: NUCLEAR MEDICINE HEPATOBILIARY IMAGING TECHNIQUE: Sequential images of the abdomen were obtained out to 60 minutes following intravenous administration of radiopharmaceutical. RADIOPHARMACEUTICALS:  5.09 mCi Tc-35m Choletec IV with a bump does of 2.1 mCi after morphine. COMPARISON:  None. FINDINGS: Prompt uptake of radiotracer by the liver. Activity passes into the small bowel compatible with patent common bile duct. After 2 hours of imaging, there is non filling of the gallbladder. 4 milligrams of morphine was administered IV. However,  additional imaging demonstrates continued non filling of the gallbladder. IMPRESSION: Non filling of the gallbladder despite delayed imaging and morphine administration. Findings compatible with cystic duct obstruction. Common bile duct is patent. Electronically Signed   By: Rolm Baptise M.D.   On: 12/22/2018 20:14   [place] Intraoperative  Result Date: 12/23/2018 CLINICAL DATA:  Ultrasound was provided for use by the ordering physician, and a technical charge was applied by the performing facility.  No radiologist interpretation/professional services rendered.   US Venous Img Lower Unilateral Right  Result Date: 12/22/2018 CLINICAL DATA:  Right lower extremity pain and edema. History of injury involving the right lower extremity. Evaluate for DVT. EXAM: RIGHT LOWER EXTREMITY VENOUS DOPPLER ULTRASOUND TECHNIQUE: Gray-scale sonography with graded compression, as well as color Doppler and duplex ultrasound were performed to evaluate the lower extremity deep venous systems from the level of the common femoral vein and including the common femoral, femoral, profunda femoral, popliteal and calf veins including the posterior tibial, peroneal and gastrocnemius veins when visible. The superficial great saphenous vein was also interrogated. Spectral Doppler was utilized to evaluate flow at rest and with distal augmentation maneuvers in the common femoral, femoral and popliteal veins. COMPARISON:  None. FINDINGS: Contralateral Common Femoral Vein: Respiratory phasicity is normal and symmetric with the symptomatic side. No evidence of thrombus. Normal compressibility. Common Femoral Vein: No evidence of thrombus. Normal compressibility, respiratory phasicity and response to augmentation. Saphenofemoral Junction: No evidence of thrombus. Normal compressibility and flow on color Doppler imaging. Profunda Femoral Vein: No evidence of thrombus. Normal compressibility and flow on color Doppler imaging. Femoral Vein: No evidence  of thrombus. Normal compressibility, respiratory phasicity and response to augmentation. Popliteal Vein: No evidence of thrombus. Normal compressibility, respiratory phasicity and response to augmentation. Calf Veins: No evidence of thrombus. Normal compressibility and flow on color Doppler imaging. Superficial Great Saphenous Vein: No evidence of thrombus. Normal compressibility. Venous Reflux:  None. Other Findings:  None. IMPRESSION: No evidence of DVT within the right lower extremity. Electronically Signed   By: Sandi Mariscal M.D.   On: 12/22/2018 14:17   Ir Perc Cholecystostomy  Result Date: 12/23/2018 INDICATION: 78 year old male with a history of acute cholecystitis EXAM: CHOLECYSTOSTOMY MEDICATIONS: Zosyn; The antibiotic was administered within an appropriate time frame prior to the initiation of the procedure. ANESTHESIA/SEDATION: Moderate (conscious) sedation was employed during this procedure. A total of Versed 1.0 mg and Fentanyl 50 mcg was administered intravenously. Moderate Sedation Time: 21 minutes. The patient's level of consciousness and vital signs were monitored continuously by radiology nursing throughout the procedure under my  direct supervision. FLUOROSCOPY TIME:  Fluoroscopy Time: 0 minutes 24 seconds COMPLICATIONS: None PROCEDURE: Informed written consent was obtained from the patient and the patient's family after a thorough discussion of the procedural risks, benefits and alternatives. All questions were addressed. Maximal Sterile Barrier Technique was utilized including caps, mask, sterile gowns, sterile gloves, sterile drape, hand hygiene and skin antiseptic. A timeout was performed prior to the initiation of the procedure. Ultrasound survey of the right upper quadrant was performed for planning purposes. Once the patient is prepped and draped in the usual sterile fashion, the skin and subcutaneous tissues overlying the gallbladder were generously infiltrated 1% lidocaine for local  anesthesia. A coaxial needle was advanced under ultrasound guidance through the skin subcutaneous tissues and a small segment of liver into the gallbladder lumen. With removal of the stylet, spontaneous dark bile drainage occurred. Using modified Seldinger technique, a 10 French drain was placed into the gallbladder fossa, with aspiration of the sample for the lab. Contrast injection confirmed position of the tube within the gallbladder lumen. Drainage catheter was attached to gravity drain with a suture retention placed. Patient tolerated the procedure well and remained hemodynamically stable throughout. No complications were encountered and no significant blood loss encountered. IMPRESSION: Status post percutaneous cholecystostomy. Signed, Yvone NeuJaime S. Reyne DumasWagner, DO, RPVI [facility] Electronically Signed   By: Gilmer MorJaime  Wagner D.O.   On: 12/23/2018 14:02    Cardiac Studies     Patient Profile     78 y.o. 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  Assessment & Plan    1. Entrococcus UTI/Sepsis: Per IM team  2. Acute cholecystitis: Percutaneous drain placed 12/23/18  3. Atrial fib with RVR: He has permanent atrial fib. He has had elevated heart rates in the setting of acute infectious process. HR is better controlled. Cannot titrate beta blocker due to soft BPs. Continue amiodarone. Can change to amiodarone 400 mg po BID and plan taper to 400 mg once daily in one week and then 200 mg daily after another week. He has been started on coumadin.    4. CAD without angina: No chest pain. Continue beta blocker. Resume statin when LFTs have normalized.    For questions or updates, please contact [facility] Please consult [email or web address] for contact info  under        Signed, Verne Carrowhristopher McAlhany, MD  12/24/2018, 8:31 AM

## 2018-12-24 NOTE — Progress Notes (Addendum)
[facility] NAME: Jaiquan Temme    MR#:  [id number]  DATE OF BIRTH:  1940/08/29  SUBJECTIVE:   Patient is a poor historian.  Complains of some abdominal pain.  No family at bedside.   REVIEW OF SYSTEMS:    Review of Systems  Constitutional: Positive for malaise/fatigue. Negative for chills and fever.  HENT: Negative for sore throat.   Eyes: Negative for blurred vision, double vision and pain.  Respiratory: Negative for cough, hemoptysis, shortness of breath and wheezing.   Cardiovascular: Negative for chest pain, palpitations, orthopnea and leg swelling.  Gastrointestinal: Positive for abdominal pain. Negative for constipation, diarrhea, heartburn, nausea and vomiting.  Genitourinary: Negative for dysuria and hematuria.  Musculoskeletal: Positive for joint pain. Negative for back pain.  Skin: Negative for rash.  Neurological: Negative for sensory change, speech change, focal weakness and headaches.  Endo/Heme/Allergies: Does not bruise/bleed easily.  Psychiatric/Behavioral: Negative for depression. The patient is not nervous/anxious.     DRUG ALLERGIES:  No Known Allergies  VITALS:  Blood pressure 123/64, pulse (!) 102, temperature (!) 97.5 F (36.4 C), temperature source Oral, resp. rate 18, height 5\' 8"  (1.727 m), weight 113.4 kg, SpO2 97 %.  PHYSICAL EXAMINATION:   Physical Exam  GENERAL:  78 y.o.-year-old patient lying in the bed with no acute distress.  EYES: Pupils equal, round, reactive to light and accommodation. No scleral icterus. Extraocular muscles intact.  HEENT: Head atraumatic, normocephalic. Oropharynx and nasopharynx clear.  NECK:  Supple, no jugular venous distention. No thyroid enlargement, no tenderness.  LUNGS: Normal breath sounds bilaterally, no wheezing, rales, rhonchi. No use of accessory muscles of respiration.  CARDIOVASCULAR: S1, S2 normal. No murmurs, rubs, or gallops.  ABDOMEN: Soft,  tenderness diffusely, nondistended. Bowel sounds present. No organomegaly or mass. GB drain+ EXTREMITIES: No cyanosis, clubbing or edema b/l.    NEUROLOGIC: Cranial nerves II through XII are intact. No focal Motor or sensory deficits b/l.   PSYCHIATRIC: The patient is alert and awake SKIN:  Right knee with 5 x 6 open wound with surrounding redness.  Significant tenderness of the knee.  Also has hematoma below and about the knee which seems to be fading away.  LABORATORY PANEL:   CBC Recent Labs  Lab 12/24/18 0549  WBC 10.7*  HGB 9.1*  HCT 27.7*  PLT 293   ------------------------------------------------------------------------------------------------------------------ Chemistries  Recent Labs  Lab 12/22/18 0303  12/24/18 0549  NA 137   < > 136  K 4.2   < > 3.8  CL 110   < > 108  CO2 18*   < > 20*  GLUCOSE 117*   < > 150*  BUN 51*   < > 23  CREATININE 2.72*   < > 1.57*  CALCIUM 7.6*   < > 7.7*  MG 2.5*  --   --   AST  --    < > 73*  ALT  --    < > 92*  ALKPHOS  --    < > 131*  BILITOT  --    < > 0.8   < > = values in this interval not displayed.   ------------------------------------------------------------------------------------------------------------------  Cardiac Enzymes Recent Labs  Lab 12/22/18 1814  TROPONINI <0.03   ------------------------------------------------------------------------------------------------------------------  RADIOLOGY:  Nm Hepatobiliary Liver Func  Result Date: 12/22/2018 CLINICAL DATA:  Concern for cholecystitis EXAM: NUCLEAR MEDICINE HEPATOBILIARY IMAGING TECHNIQUE: Sequential images of the abdomen were obtained out to 60 minutes following intravenous administration  of radiopharmaceutical. RADIOPHARMACEUTICALS:  5.09 mCi Tc-8394m Choletec IV with a bump does of 2.1 mCi after morphine. COMPARISON:  None. FINDINGS: Prompt uptake of radiotracer by the liver. Activity passes into the small bowel compatible with patent common bile duct.  After 2 hours of imaging, there is non filling of the gallbladder. 4 milligrams of morphine was administered IV. However, additional imaging demonstrates continued non filling of the gallbladder. IMPRESSION: Non filling of the gallbladder despite delayed imaging and morphine administration. Findings compatible with cystic duct obstruction. Common bile duct is patent. Electronically Signed   By: Charlett NoseKevin  Dover M.D.   On: 12/22/2018 20:14   [place] Intraoperative  Result Date: 12/23/2018 CLINICAL DATA:  Ultrasound was provided for use by the ordering physician, and a technical charge was applied by the performing facility.  No radiologist interpretation/professional services rendered.   Ir Perc Cholecystostomy  Result Date: 12/23/2018 INDICATION: 78 year old male with a history of acute cholecystitis EXAM: CHOLECYSTOSTOMY MEDICATIONS: Zosyn; The antibiotic was administered within an appropriate time frame prior to the initiation of the procedure. ANESTHESIA/SEDATION: Moderate (conscious) sedation was employed during this procedure. A total of Versed 1.0 mg and Fentanyl 50 mcg was administered intravenously. Moderate Sedation Time: 21 minutes. The patient's level of consciousness and vital signs were monitored continuously by radiology nursing throughout the procedure under my direct supervision. FLUOROSCOPY TIME:  Fluoroscopy Time: 0 minutes 24 seconds COMPLICATIONS: None PROCEDURE: Informed written consent was obtained from the patient and the patient's family after a thorough discussion of the procedural risks, benefits and alternatives. All questions were addressed. Maximal Sterile Barrier Technique was utilized including caps, mask, sterile gowns, sterile gloves, sterile drape, hand hygiene and skin antiseptic. A timeout was performed prior to the initiation of the procedure. Ultrasound survey of the right upper quadrant was performed for planning purposes. Once the patient is prepped and draped in the usual  sterile fashion, the skin and subcutaneous tissues overlying the gallbladder were generously infiltrated 1% lidocaine for local anesthesia. A coaxial needle was advanced under ultrasound guidance through the skin subcutaneous tissues and a small segment of liver into the gallbladder lumen. With removal of the stylet, spontaneous dark bile drainage occurred. Using modified Seldinger technique, a 10 French drain was placed into the gallbladder fossa, with aspiration of the sample for the lab. Contrast injection confirmed position of the tube within the gallbladder lumen. Drainage catheter was attached to gravity drain with a suture retention placed. Patient tolerated the procedure well and remained hemodynamically stable throughout. No complications were encountered and no significant blood loss encountered. IMPRESSION: Status post percutaneous cholecystostomy. Signed, Yvone NeuJaime S. Reyne DumasWagner, DO, RPVI [facility] Electronically Signed   By: Gilmer MorJaime  Wagner D.O.   On: 12/23/2018 14:02     ASSESSMENT AND PLAN:   *Acute cholecystitis.   - On IV Zosyn--change to IV unasyn (cover enterococcus in urine) -HIDA scan with no dye entry into the cystic duct.   -s/p percutaneous cholecystostomy by  IR. -LRFT's, bilirubin and WBC improved -GB fluid no bacteria so far  *UTI -on IV unasyn.   -UC enterococcus feacalis  *Sepsis present on admission due to acute infection.  Improving.  *Atrial fibrillation with rapid ventricular rate.   - on metoprolol and amiodarone - Discussed with cardiology.  -Patient on Coumadin  *Acute kidney injury over CKD stage III due to hypotension/ATN/infection. -  Continue IV fluids.  Monitor input and output.   -creat better  *Anemia of chronic disease.  Monitor.  *  Right knee wound and joint pain Orthopedics consulted.  Appreciate Dr. Rondel BatonMiller's input.  Advise dressing changes.  *Diarrhea-resolved  PT to be initiated  No  family in the room CODE STATUS: Partial code.  DNI  DVT Prophylaxis: coumadin  TOTAL TIME TAKING CARE OF THIS PATIENT: 30 minutes.   POSSIBLE D/C IN few DAYS, DEPENDING ON CLINICAL CONDITION.  Enedina FinnerSona Kincaid Tiger M.D on 12/24/2018 at 3:24 PM  Between 7am to 6pm - Pager - [phone]  After 6pm go to [email or web address] - password [email or web address] [facility]  [facility] [place] [facility]  Office  [phone]  CC: Primary care physician; Unice Baileyosenberg, Beth S, MD  Note: This dictation was prepared with Dragon dictation along with smaller phrase technology. Any transcriptional errors that result from this process are unintentional.

## 2018-12-25 LAB — CBC
HCT: 28.3 % — ABNORMAL LOW (ref 39.0–52.0)
Hemoglobin: 9.4 g/dL — ABNORMAL LOW (ref 13.0–17.0)
MCH: 32.1 pg (ref 26.0–34.0)
MCHC: 33.2 g/dL (ref 30.0–36.0)
MCV: 96.6 fL (ref 80.0–100.0)
Platelets: 339 10*3/uL (ref 150–400)
RBC: 2.93 MIL/uL — ABNORMAL LOW (ref 4.22–5.81)
RDW: 14.8 % (ref 11.5–15.5)
WBC: 8.3 10*3/uL (ref 4.0–10.5)
nRBC: 0 % (ref 0.0–0.2)

## 2018-12-25 LAB — PROTIME-INR
INR: 2.8 — ABNORMAL HIGH (ref 0.8–1.2)
Prothrombin Time: 29.2 seconds — ABNORMAL HIGH (ref 11.4–15.2)

## 2018-12-25 NOTE — Consult Note (Signed)
ANTICOAGULATION CONSULT NOTE   Pharmacy Consult for Warfarin Indication: Post IR Procedure Consult - Anticoagulant/Antiplatelet PTA/Inpatient Med List Review by Pharmacist for afib.   No Known Allergies  Patient Measurements: Height: 5\' 8"  (172.7 cm) Weight: 250 lb (113.4 kg) IBW/kg (Calculated) : 68.4  Vital Signs: Temp: 98.2 F (36.8 C) (06/21 0850) Temp Source: Oral (06/21 0850) BP: 112/90 (06/21 0850) Pulse Rate: 104 (06/21 0850)  Labs: Recent Labs    12/22/18 1814  12/23/18 0804 12/24/18 0549 12/25/18 0442  HGB  --    < > 9.0* 9.1* 9.4*  HCT  --   --  27.1* 27.7* 28.3*  PLT  --   --  302 293 339  LABPROT  --   --   --  23.3* 29.2*  INR  --   --   --  2.1* 2.8*  CREATININE  --   --  1.88* 1.57*  --   TROPONINI <0.03  --   --   --   --    < > = values in this interval not displayed.    Estimated Creatinine Clearance: 47.4 mL/min (A) (by C-G formula based on SCr of 1.57 mg/dL (H)).   Medical History: Past Medical History:  Diagnosis Date  . Agitation    and irritability, situational exacerbation  . Alveolar/parietoalveolar pneumonopathy, other    06/14/09 CT chest, Mild LLL GGO; 02/28/10 CT chest , improved  . Atrial fibrillation (HCC)    Coumadin therapy - Dr. Jens Somrenshaw.  Cardioversion 08/23/09, Amiodarone Rx since 09/03/2009  . CAD (coronary artery disease) 09/2008   Dr. Jens Somrenshaw, stent to LAD  . CVA (cerebral vascular accident) (HCC) 2007   In the setting of atrial fibrillation  . Dyspnea 09/2008   Class 3, since 2008.  Significant tachycardia.  No PE or fibrosis on CT chest 3/10, 12/10, 02/2010, normal PFT but isolated low DLCO  06/2009, normal hgb, TSH, creat and albumin , Dec 2010.  Resolved after A Fib cardioversion and Amiodarone Marh 2011  . Hepatitis   . Hiatal hernia   . History of chickenpox   . Hyperlipidemia   . Hypertension   . Nonspecific abnormal results of pulmonary system function study   . Pulmonary nodules 09/2008   Nonspecific  reticulonodular nodules RLL superior segment, resolved on CT 06/14/09  . Seizures (HCC) 12/2008   Dr. Sharene SkeansHickling    Medications:  Medications Prior to Admission  Medication Sig Dispense Refill Last Dose  . albuterol (PROVENTIL) (2.5 MG/3ML) 0.083% nebulizer solution Take 2.5 mg by nebulization every 4 (four) hours as needed for wheezing or shortness of breath.   prn at prn  . cholecalciferol (VITAMIN D) 1000 UNITS tablet Take 1,000 Units by mouth daily.     12/20/2018 at 2000  . levothyroxine (SYNTHROID) 50 MCG tablet Take 50 mcg by mouth every evening.   12/20/2018 at 1800  . lisinopril (ZESTRIL) 2.5 MG tablet Take 2.5 mg by mouth at bedtime.   12/20/2018 at 2000  . metoprolol tartrate (LOPRESSOR) 25 MG tablet Take 0.5 tablets (12.5 mg total) by mouth daily. 45 tablet 3 12/20/2018 at 1800  . rosuvastatin (CRESTOR) 20 MG tablet Take 1 tablet (20 mg total) by mouth daily. (Patient taking differently: Take 10 mg by mouth every evening. ) 90 tablet 3 12/20/2018 at 1800  . tamsulosin (FLOMAX) 0.4 MG CAPS capsule Take 0.8 mg by mouth at bedtime.   12/20/2018 at 2000  . warfarin (COUMADIN) 1 MG tablet Take 3 and a half tablets  by mouth daily (or as directed) (Patient taking differently: Take 3-4 mg by mouth daily at 6 PM. Take 3 mg by mouth on Sunday, Tuesday, Thursday, Friday and Saturday. Take 4 mg on Monday and Wednesday) 100 tablet 3 12/20/2018 at 1800  . amiodarone (PACERONE) 200 MG tablet Take 1 tablet (200 mg total) by mouth daily. (Patient not taking: Reported on 12/21/2018) 90 tablet 3 Not Taking at Unknown time  . carbamazepine (TEGRETOL) 200 MG tablet Take 1 1/2 tabs by mouth twice daily (Patient not taking: Reported on 12/21/2018) 270 tablet 3 Not Taking at Unknown time  . docusate sodium (COLACE) 100 MG capsule Take 200 mg by mouth daily.     prn at prn  . levothyroxine (LEVOTHROID) 25 MCG tablet Take 1 tablet (25 mcg total) by mouth daily. 90 tablet 3   . LORazepam (ATIVAN) 0.5 MG tablet Take one  half tablet by mouth daily. (Patient not taking: Reported on 12/21/2018) 45 tablet 1 Not Taking at Unknown time  . QUEtiapine (SEROQUEL) 25 MG tablet Take 1 tablet (25 mg total) by mouth 2 (two) times daily. (Patient not taking: Reported on 12/21/2018) 180 tablet 1 Not Taking at Unknown time  . sertraline (ZOLOFT) 25 MG tablet Take 1 tablet (25 mg total) by mouth daily. (Patient not taking: Reported on 12/21/2018) 90 tablet 3 Not Taking at Unknown time  . sorbitol 70 % solution Take 2 tablespoonfuls every two hours as needed.    prn at prn   Scheduled:  . collagenase   Topical Daily  . hydrogen peroxide   Topical BID  . levothyroxine  50 mcg Oral QPM  . metoprolol tartrate  25 mg Oral BID  . senna-docusate  1 tablet Oral BID  . sodium chloride flush  3 mL Intravenous Q12H  . sodium chloride flush  5 mL Intracatheter Q8H  . tamsulosin  0.8 mg Oral QHS  . Warfarin - Pharmacist Dosing Inpatient   Does not apply q1800   Infusions:  . sodium chloride 500 mL (12/25/18 0925)  . amiodarone 30 mg/hr (12/24/18 2334)  . ampicillin-sulbactam (UNASYN) IV 3 g (12/25/18 0929)   PRN: sodium chloride, acetaminophen **OR** acetaminophen, albuterol, HYDROcodone-acetaminophen, morphine injection, ondansetron **OR** ondansetron (ZOFRAN) IV, sodium chloride flush Anti-infectives (From admission, onward)   Start     Dose/Rate Route Frequency Ordered Stop   12/24/18 2200  Ampicillin-Sulbactam (UNASYN) 3 g in sodium chloride 0.9 % 100 mL IVPB     3 g 200 mL/hr over 30 Minutes Intravenous Every 6 hours 12/24/18 1516     12/21/18 2200  piperacillin-tazobactam (ZOSYN) IVPB 3.375 g  Status:  Discontinued     3.375 g 12.5 mL/hr over 240 Minutes Intravenous Every 8 hours 12/21/18 1522 12/24/18 1516   12/21/18 1145  piperacillin-tazobactam (ZOSYN) IVPB 3.375 g     3.375 g 100 mL/hr over 30 Minutes Intravenous  Once 12/21/18 1144 12/21/18 1402   12/21/18 1115  metroNIDAZOLE (FLAGYL) tablet 500 mg     50 0 mg Oral  Once  12/21/18 1104 12/21/18 1347      Assessment: Pharmacy was consulted to restart anticoagulant. Patient was admitted with an INR of 5.6 and received Vit K on 6/17 for percutaneous cholecystostomy with IR.  Home dose: Take 3 mg by mouth on Sunday, Tuesday, Thursday, Friday and Saturday. Take 4 mg on Monday and Wednesday.   DDI: pt has been started on amiodarone.   Date INR Warfarin Dose  6/17 5.6 Held  6/18 1.4 Held  6/19  3 mg (ordered)  6/20 2.1 1 mg  6/21 2.7 Held      Goal of Therapy:  INR 2-3 Monitor platelets by anticoagulation protocol: Yes   Plan:  INR continues to rapidly increase. Will hold dose on 6/21 and obtain INR with am labs.   Patient on Amiodarone infusion and Unasyn.   Pharmacy will continue to monitor and adjust per consult.   Chanie Soucek L, RPh 12/25/2018,12:12 PM

## 2018-12-25 NOTE — Evaluation (Signed)
Physical Therapy Evaluation Patient Details Name: Darrell Hayes Dario MRN: [id number] DOB: 01/05/1941 Today's Date: 12/25/2018   History of Present Illness  Darrell Hayes Navarra is a 78 y.o. male presenting to ED with abdominal pain, R knee wound, and difficulty urinating. 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 Uses hemi walker for home mobility and WC (with someone else propeling) in community at baseline. Usually goes to [facility] Mon-Fri with aid coming in for afternoon until daughter is home. D/t COVID 19 is unable to go to [facility] and has aid in am and pm for a few hours. Aids help patient with all ADLs, daughter completes cooking and cleaning. Daughter works full time.  Clinical Impression  Pt is a [age]year old man with impairments in generalized LE and trunk weakness, decreased endurance, decreased coordination,and decreased safety awareness. Patient is currently minA for supine > sit with heavy use of bed rails (daughter reports he does have a hospital bed at home but does not use it), modA for scooting, and modA for sit>supine. Patient is able to complete STS transfer with hemiwalker modA with maxA needed for attempted step. ModA needed for 2x chair <> bed with maxA instance to prevent LOB during transfer. Patient relies heavily on PT assistance and cues for safe technique of all transfers. Patient previously used hemiwalker for home sitances [facility] was [facility] with aid coming in afternoon until daughter came home. Due to COVID19 patient is unable to go to [facility] and has aids for a couple hours in morning and afternoon. Aids help with all ADLs. Family is very resistance to SNF recommendation, PT explains that pt needs 24hour care,  HHPT, possibly OT. Daughter is adamant that she can provide this. PT recommendation remains SNF for safety, unless family can, with certainty provide medical personal at home 23/7. Would benefit from skilled PT to address above deficits and promote optimal return to PLOF     Follow Up Recommendations SNF;Supervision/Assistance - 24 hour    Equipment Recommendations  (hemiwalker)    Recommendations for Other Services OT consult     Precautions / Restrictions Precautions Precautions: Fall      Mobility  Bed Mobility Overal bed mobility: Needs Assistance Bed Mobility: Supine to Sit;Sit to Supine     Supine to sit: Min assist Sit to supine: Min assist   General bed mobility comments: Uses bed rails for assistance, increased time needed, physical assitance for trunk managment > LE management  Transfers Overall transfer level: Needs assistance Equipment used: Hemi-walker Transfers: Stand Pivot Transfers;Sit to/from Stand;Lateral/Scoot Transfers Sit to Stand: Mod assist;From elevated surface Stand pivot transfers: Mod assist;Max assist      Lateral/Scoot Transfers: Mod assist General transfer comment: modA to scoot to edge of bed/chair. Able to stand modA, requires maxA during stand pivot to prevent fall, able to lower to chair modA with cuing for control  Ambulation/Gait Ambulation/Gait assistance: Max assist Gait Distance (Feet): 1 Feet Assistive device: Hemi-walker   Gait velocity: decreased   General Gait Details: Pt able to take a step forward and then requires maxA to keep balance. Reports he cannot go further, able to take a step back with modA and return to sitting EOB  Stairs  Wheelchair Mobility    Modified Rankin (Stroke Patients Only)       Balance Overall balance assessment: Needs assistance Sitting-balance support: Bilateral upper extremity supported;Feet supported Sitting balance-Leahy Scale: Poor   Postural control: Right lateral  lean Standing balance support: Single extremity supported Standing balance-Leahy Scale: Poor                               Pertinent Vitals/Pain Pain Assessment: No/denies pain    Home Living Family/patient expects to be discharged to:: Private residence Living Arrangements: Children Available Help at Discharge: Family;Personal care attendant Type of Home: Apartment Home Access: Level entry     Home Layout: One level Home Equipment: Wheelchair - Sport and exercise psychologist CommentsActuary for house ambulation, WC for communit    Prior Function Level of Independence: Needs assistance   Gait / Transfers Assistance Needed: modI with hemiwalker  ADL's / Homemaking Assistance Needed: Assistance with all ADLs        Hand Dominance   Dominant Hand: Left    Extremity/Trunk Assessment   Upper Extremity Assessment Upper Extremity Assessment: RUE deficits/detail RUE Deficits / Details: Unable to produce any active movement of RUE. Swelling in R hand with finger flex, unable to extend RUE Sensation: decreased light touch RUE Coordination: decreased gross motor;decreased fine motor    Lower Extremity Assessment Lower Extremity Assessment: RLE deficits/detail RLE Deficits / Details: 4/5 DF, knee flex, knee ext. Unable to produce R hip flex, abd, add in sitting RLE Sensation: decreased light touch RLE Coordination: decreased fine motor;decreased gross motor    Cervical / Trunk Assessment Cervical / Trunk Assessment: Other exceptions Cervical / Trunk Exceptions: L lateral lean, forward lean d/t decreased strength  Communication   Communication: No difficulties  Cognition Arousal/Alertness: Awake/alert Behavior During Therapy: WFL for tasks assessed/performed Overall Cognitive Status: History of cognitive impairments - at baseline                                        General Comments      Exercises Other Exercises Other Exercises:  Supine <> sit minA for supine>sit with heavy use of bedrails; modA for sit > supine for LE managment R>L Other Exercises: Scooting modA with cuing for scoot, with decent carry over Other Exercises: STS x2 trials. Able to follow cuing for safety and stand with hemiwalker and modA from PT on R side. Able to take 1 step forward, but requires maxA to maintain balance. Sits with control following PT cuing for technique Other Exercises: Stand pivot transfer x2 trials with good compliance with cuing for technique, heavy modA needed for wsafety,   Assessment/Plan    PT Assessment Patient needs continued PT services  PT Problem List Decreased activity tolerance;Decreased balance;Decreased coordination;Decreased strength;Decreased mobility;Decreased safety awareness       PT Treatment Interventions DME instruction;Therapeutic activities;Gait training;Therapeutic exercise;Patient/family education;Stair training;Balance training;Functional mobility training;Neuromuscular re-education    PT Goals (Current goals can be found in the Care Plan section)  Acute Rehab PT Goals Patient Stated Goal: ambulate with ind PT Goal Formulation: With patient/family Time For Goal Achievement: 01/08/19 Potential to Achieve Goals: Fair    Frequency Min 2X/week   Barriers to discharge Decreased caregiver support Family very resistant to SNF recommendation; unknown if 24 hour care can be provided at home    Co-evaluation  AM-PAC PT "6 Clicks" Mobility  Outcome Measure Help needed turning from your back to your side while in a flat bed without using bedrails?: A Lot Help needed moving from lying on your back to sitting on the side of a flat bed without using bedrails?: A Lot Help needed moving to and from a bed to a chair (including a wheelchair)?: A Lot Help needed standing up from a chair using your arms (e.g., wheelchair or bedside chair)?: A Lot Help needed to walk in hospital room?:  Total Help needed climbing 3-5 steps with a railing? : Total 6 Click Score: 10    End of Session Equipment Utilized During Treatment: Gait belt Activity Tolerance: Patient tolerated treatment well Patient left: in bed;with family/visitor present;with call bell/phone within reach;with bed alarm set Nurse Communication: Mobility status PT Visit Diagnosis: Unsteadiness on feet (R26.81);Other abnormalities of gait and mobility (R26.89);Muscle weakness (generalized) (M62.81);Difficulty in walking, not elsewhere classified (R26.2)    Time: 1020-1109 PT Time Calculation (min) (ACUTE ONLY): 49 min   Charges:   PT Evaluation $PT Eval Moderate Complexity: 1 Mod PT Treatments $Therapeutic Activity: 38-52 mins        Staci Acostahelsea Miller PT, DPT  Staci Acostahelsea Miller 12/25/2018, 11:33 AM

## 2018-12-25 NOTE — Progress Notes (Addendum)
[employer] - [employer] [facility]   PATIENT NAME: Darrell Hayes    MR#:  [id number]  DATE OF BIRTH:  [birth year]  SUBJECTIVE:   Patient looks and feels a lot better. Some abdominal pain at the tube site. Daughter in the room.   REVIEW OF SYSTEMS:    Review of Systems  Constitutional: Positive for malaise/fatigue. Negative for chills and fever.  HENT: Negative for sore throat.   Eyes: Negative for blurred vision, double vision and pain.  Respiratory: Negative for cough, hemoptysis, shortness of breath and wheezing.   Cardiovascular: Negative for chest pain, palpitations, orthopnea and leg swelling.  Gastrointestinal: Negative for constipation, diarrhea, heartburn, nausea and vomiting.  Genitourinary: Negative for dysuria and hematuria.  Musculoskeletal: Positive for joint pain. Negative for back pain.  Skin: Negative for rash.  Neurological: Negative for sensory change, speech change, focal weakness and headaches.  Endo/Heme/Allergies: Does not bruise/bleed easily.  Psychiatric/Behavioral: Negative for depression. The patient is not nervous/anxious.     DRUG ALLERGIES:  No Known Allergies  VITALS:  Blood pressure 112/90, pulse (!) 104, temperature 98.2 F (36.8 C), temperature source Oral, resp. rate 18, height 5\' 8"  (1.727 m), weight 113.4 kg, SpO2 97 %.  PHYSICAL EXAMINATION:   Physical Exam  GENERAL:  78 y.o.-year-old patient lying in the bed with no acute distress.  EYES: Pupils equal, round, reactive to light and accommodation. No scleral icterus. Extraocular muscles intact.  HEENT: Head atraumatic, normocephalic. Oropharynx and nasopharynx clear.  NECK:  Supple, no jugular venous distention. No thyroid enlargement, no tenderness.  LUNGS: Normal breath sounds bilaterally, no wheezing, rales, rhonchi. No use of accessory muscles of respiration.  CARDIOVASCULAR: S1, S2 normal. No murmurs, rubs, or gallops.  ABDOMEN: Soft, tenderness diffusely,  nondistended. Bowel sounds present. No organomegaly or mass. GB drain+ EXTREMITIES: No cyanosis, clubbing or edema b/l.    NEUROLOGIC: Cranial nerves II through XII are intact. No focal Motor or sensory deficits b/l.   PSYCHIATRIC: The patient is alert and awake SKIN:  Right knee with 5 x 6 open wound with surrounding redness.  Significant tenderness of the knee.  Also has hematoma below and about the knee which seems to be fading away.  LABORATORY PANEL:   CBC Recent Labs  Lab 12/25/18 0442  WBC 8.3  HGB 9.4*  HCT 28.3*  PLT 339   ------------------------------------------------------------------------------------------------------------------ Chemistries  Recent Labs  Lab 12/22/18 0303  12/24/18 0549  NA 137   < > 136  K 4.2   < > 3.8  CL 110   < > 108  CO2 18*   < > 20*  GLUCOSE 117*   < > 150*  BUN 51*   < > 23  CREATININE 2.72*   < > 1.57*  CALCIUM 7.6*   < > 7.7*  MG 2.5*  --   --   AST  --    < > 73*  ALT  --    < > 92*  ALKPHOS  --    < > 131*  BILITOT  --    < > 0.8   < > = values in this interval not displayed.   ------------------------------------------------------------------------------------------------------------------  Cardiac Enzymes Recent Labs  Lab 12/22/18 1814  TROPONINI <0.03   ------------------------------------------------------------------------------------------------------------------  RADIOLOGY:  [place] Intraoperative  Result Date: 12/23/2018 CLINICAL DATA:  Ultrasound was provided for use by the ordering physician, and a technical charge was applied by the performing facility.  No radiologist interpretation/professional services rendered.   Ir  Perc Cholecystostomy  Result Date: 12/23/2018 INDICATION: 78 year old male with a history of acute cholecystitis EXAM: CHOLECYSTOSTOMY MEDICATIONS: Zosyn; The antibiotic was administered within an appropriate time frame prior to the initiation of the procedure. ANESTHESIA/SEDATION: Moderate  (conscious) sedation was employed during this procedure. A total of Versed 1.0 mg and Fentanyl 50 mcg was administered intravenously. Moderate Sedation Time: 21 minutes. The patient's level of consciousness and vital signs were monitored continuously by radiology nursing throughout the procedure under my direct supervision. FLUOROSCOPY TIME:  Fluoroscopy Time: 0 minutes 24 seconds COMPLICATIONS: None PROCEDURE: Informed written consent was obtained from the patient and the patient's family after a thorough discussion of the procedural risks, benefits and alternatives. All questions were addressed. Maximal Sterile Barrier Technique was utilized including caps, mask, sterile gowns, sterile gloves, sterile drape, hand hygiene and skin antiseptic. A timeout was performed prior to the initiation of the procedure. Ultrasound survey of the right upper quadrant was performed for planning purposes. Once the patient is prepped and draped in the usual sterile fashion, the skin and subcutaneous tissues overlying the gallbladder were generously infiltrated 1% lidocaine for local anesthesia. A coaxial needle was advanced under ultrasound guidance through the skin subcutaneous tissues and a small segment of liver into the gallbladder lumen. With removal of the stylet, spontaneous dark bile drainage occurred. Using modified Seldinger technique, a 10 French drain was placed into the gallbladder fossa, with aspiration of the sample for the lab. Contrast injection confirmed position of the tube within the gallbladder lumen. Drainage catheter was attached to gravity drain with a suture retention placed. Patient tolerated the procedure well and remained hemodynamically stable throughout. No complications were encountered and no significant blood loss encountered. IMPRESSION: Status post percutaneous cholecystostomy. Signed, Dulcy Fanny. Dellia Nims, RPVI [facility] Radiology Electronically  Signed   By: Corrie Mckusick D.O.   On: 12/23/2018 14:02     ASSESSMENT AND PLAN:   *Acute cholecystitis.   - On IV Zosyn--change to IV unasyn (cover enterococcus in urine) -HIDA scan with no dye entry into the cystic duct.   -s/p percutaneous cholecystostomy by  IR. -LRFT's, bilirubin and WBC improved -GB fluid no bacteria so far  *UTI -on IV unasyn.   -UC enterococcus feacalis  *Sepsis present on admission due to acute infection.  Improving.  *Atrial fibrillation with rapid ventricular rate.   - on metoprolol and amiodarone - Discussed with cardiology.  -Patient on Coumadin  *Acute kidney injury over CKD stage III due to hypotension/ATN/infection. -  Continue IV fluids.  Monitor input and output.   -creat better  *Anemia of chronic disease.  Monitor.  *Right knee wound and joint pain Orthopedics consulted.  Appreciate Dr. Ammie Ferrier input.  Advise dressing changes.  *Diarrhea-resolved  PT recommends rehab versus 24 hour home health with supervision.  Patient gets services thru [facility]. Daughter would like to have patient go home at discharge. Spoke with Regulatory affairs officer to discuss with family and provide additional assistance needed.  Patient will discharged tomorrow if continues to show improvement. Daughter in agreement.   CODE STATUS: Partial code.  DNI  DVT Prophylaxis: coumadin  TOTAL TIME TAKING CARE OF THIS PATIENT: 30 minutes.   POSSIBLE D/C IN 1 DAY, DEPENDING ON CLINICAL CONDITION.  Fritzi Mandes M.D on 12/25/2018 at 12:32 PM  Between 7am to 6pm - Pager - [phone]  After 6pm go to [email or web address] - password [email or web address] [facility]  Office  [phone]  CC:  Primary care physician; Unice Baileyosenberg, Beth S, MD  Note: This dictation was prepared with Dragon dictation along with smaller phrase technology. Any transcriptional errors that result from this process are unintentional.

## 2018-12-25 NOTE — Progress Notes (Signed)
[place] Hospital Day(s): 4.   Post op day(s):  Marland Kitchen   Interval History: Patient seen and examined, no acute events or new complaints overnight. Patient reports feeling better but still with abdominal pain, denies nausea or vomiting.  Denies fevers.  Vital signs in last 24 hours: [min-max] current  Temp:  [97.9 F (36.6 C)-98.4 F (36.9 C)] 98.2 F (36.8 C) (06/21 0850) Pulse Rate:  [70-104] 104 (06/21 0850) Resp:  [18] 18 (06/21 0850) BP: (107-131)/(63-90) 112/90 (06/21 0850) SpO2:  [97 %-98 %] 97 % (06/21 0850)     Height: 5\' 8"  (172.7 cm) Weight: 113.4 kg BMI (Calculated): 38.02    Physical Exam:  Constitutional: alert, cooperative and no distress  Respiratory: breathing non-labored at rest  Cardiovascular: regular rate and sinus rhythm  Gastrointestinal: soft, mild tender in right upper quadrant, and non-distended. Right costal drain with dark bile.   Labs:  CBC Latest Ref Rng & Units 12/25/2018 12/24/2018 12/23/2018  WBC 4.0 - 10.5 K/uL 8.3 10.7(H) 16.6(H)  Hemoglobin 13.0 - 17.0 g/dL 9.4(L) 9.1(L) 9.0(L)  Hematocrit 39.0 - 52.0 % 28.3(L) 27.7(L) 27.1(L)  Platelets 150 - 400 K/uL 339 293 302   CMP Latest Ref Rng & Units 12/24/2018 12/23/2018 12/22/2018  Glucose 70 - 99 mg/dL 150(H) 148(H) 117(H)  BUN 8 - 23 mg/dL 23 31(H) 51(H)  Creatinine 0.61 - 1.24 mg/dL 1.57(H) 1.88(H) 2.72(H)  Sodium 135 - 145 mmol/L 136 138 137  Potassium 3.5 - 5.1 mmol/L 3.8 4.0 4.2  Chloride 98 - 111 mmol/L 108 111 110  CO2 22 - 32 mmol/L 20(L) 19(L) 18(L)  Calcium 8.9 - 10.3 mg/dL 7.7(L) 7.8(L) 7.6(L)  Total Protein 6.5 - 8.1 g/dL 6.1(L) 6.4(L) -  Total Bilirubin 0.3 - 1.2 mg/dL 0.8 1.3(H) -  Alkaline Phos 38 - 126 U/L 131(H) 146(H) -  AST 15 - 41 U/L 73(H) 147(H) -  ALT 0 - 44 U/L 92(H) 133(H) -    Imaging studies: No new pertinent imaging studies   Assessment/Plan:  78 y.o. male with acute cholecystitis s/p percutaneous cholecystostomy, complicated by pertinent  comorbidities including Afib with RVR therapeutic anticoagulation, HTN, HLD, dementia following CVA, and advanced age.  Patient has been responding well to percutaneous drainage for the treatment of acute cholecystitis.  Liver enzymes, alkaline phosphatase and bilirubin in decreasing trend.  White blood cell count normalized.  Patient is tolerating diet.  Still with pain but improving.  From surgery standpoint agreed to continue current antibiotic therapy and discharge when medically stable.  Will remain aware case while the patient is in-house but if discharge will follow with Dr. Hampton Abbot as outpatient.  Arnold Long, MD

## 2018-12-25 NOTE — Plan of Care (Signed)
  Problem: Clinical Measurements: Goal: Signs and symptoms of infection will decrease Outcome: Progressing   Problem: Respiratory: Goal: Ability to maintain adequate ventilation will improve Outcome: Progressing   Problem: Clinical Measurements: Goal: Will remain free from infection Outcome: Progressing Goal: Diagnostic test results will improve Outcome: Progressing   Problem: Pain Managment: Goal: General experience of comfort will improve Outcome: Progressing

## 2018-12-26 LAB — PROTIME-INR
INR: 2.9 — ABNORMAL HIGH (ref 0.8–1.2)
Prothrombin Time: 30.2 seconds — ABNORMAL HIGH (ref 11.4–15.2)

## 2018-12-26 LAB — CULTURE, BLOOD (ROUTINE X 2)
Culture: NO GROWTH
Culture: NO GROWTH
Special Requests: ADEQUATE

## 2018-12-26 MED ORDER — WARFARIN SODIUM 1 MG PO TABS: ORAL_TABLET | ORAL | 3 refills | Status: DC

## 2018-12-26 MED ORDER — HYDROCODONE-ACETAMINOPHEN 5-325 MG PO TABS: 1.0000 | ORAL_TABLET | Freq: Four times a day (QID) | ORAL | 0 refills | Status: DC | PRN

## 2018-12-26 MED ORDER — AMIODARONE HCL 200 MG PO TABS: ORAL_TABLET | ORAL | 1 refills | Status: DC

## 2018-12-26 MED ORDER — COLLAGENASE 250 UNIT/GM EX OINT: TOPICAL_OINTMENT | Freq: Every day | CUTANEOUS | 0 refills | Status: DC

## 2018-12-26 MED ORDER — AMIODARONE HCL 200 MG PO TABS
400.0000 mg | ORAL_TABLET | Freq: Two times a day (BID) | ORAL | Status: DC
  Administered 2018-12-26: 11:00:00 400 mg via ORAL
  Filled 2018-12-26: qty 2

## 2018-12-26 MED ORDER — AMOXICILLIN-POT CLAVULANATE 875-125 MG PO TABS
1.0000 | ORAL_TABLET | Freq: Two times a day (BID) | ORAL | Status: DC
  Administered 2018-12-26: 11:00:00 1 via ORAL
  Filled 2018-12-26: qty 1

## 2018-12-26 MED ORDER — AMOXICILLIN-POT CLAVULANATE 875-125 MG PO TABS: 1.0000 | ORAL_TABLET | Freq: Two times a day (BID) | ORAL | 0 refills | Status: DC

## 2018-12-26 MED ORDER — COLLAGENASE 250 UNIT/GM EX OINT
TOPICAL_OINTMENT | Freq: Every day | CUTANEOUS | Status: DC
  Administered 2018-12-26: 12:00:00 via TOPICAL
  Filled 2018-12-26: qty 30

## 2018-12-26 NOTE — Consult Note (Signed)
ANTICOAGULATION CONSULT NOTE   Pharmacy Consult for Warfarin Indication: Post IR Procedure Consult - Anticoagulant/Antiplatelet PTA/Inpatient Med List Review by Pharmacist for afib.   No Known Allergies  Patient Measurements: Height: 5\' 8"  (172.7 cm) Weight: 250 lb (113.4 kg) IBW/kg (Calculated) : 68.4  Vital Signs: Temp: 97.6 F (36.4 C) (06/22 0757) Temp Source: Oral (06/22 0757) BP: 123/72 (06/22 0757) Pulse Rate: 78 (06/22 0757)  Labs: Recent Labs    12/24/18 0549 12/25/18 0442 12/26/18 0616  HGB 9.1* 9.4*  --   HCT 27.7* 28.3*  --   PLT 293 339  --   LABPROT 23.3* 29.2* 30.2*  INR 2.1* 2.8* 2.9*  CREATININE 1.57*  --   --     Estimated Creatinine Clearance: 47.4 mL/min (A) (by C-G formula based on SCr of 1.57 mg/dL (H)).   Medical History: Past Medical History:  Diagnosis Date  . Agitation    and irritability, situational exacerbation  . Alveolar/parietoalveolar pneumonopathy, other    06/14/09 CT chest, Mild LLL GGO; 02/28/10 CT chest , improved  . Atrial fibrillation (HCC)    Coumadin therapy - Dr. Stanford Breed.  Cardioversion 08/23/09, Amiodarone Rx since 09/03/2009  . CAD (coronary artery disease) 09/2008   Dr. Stanford Breed, stent to LAD  . CVA (cerebral vascular accident) ([place]) 2007   In the setting of atrial fibrillation  . Dyspnea 09/2008   Class 3, since 2008.  Significant tachycardia.  No PE or fibrosis on CT chest 3/10, 12/10, 02/2010, normal PFT but isolated low DLCO  06/2009, normal hgb, TSH, creat and albumin , Dec 2010.  Resolved after A Fib cardioversion and Amiodarone Marh 2011  . Hepatitis   . Hiatal hernia   . History of chickenpox   . Hyperlipidemia   . Hypertension   . Nonspecific abnormal results of pulmonary system function study   . Pulmonary nodules 09/2008   Nonspecific reticulonodular nodules RLL superior segment, resolved on CT 06/14/09  . Seizures ([place]) 12/2008   Dr. Gaynell Face    Medications:  Medications Prior to Admission  Medication  Sig Dispense Refill Last Dose  . albuterol (PROVENTIL) (2.5 MG/3ML) 0.083% nebulizer solution Take 2.5 mg by nebulization every 4 (four) hours as needed for wheezing or shortness of breath.   prn at prn  . cholecalciferol (VITAMIN D) 1000 UNITS tablet Take 1,000 Units by mouth daily.     12/20/2018 at 2000  . levothyroxine (SYNTHROID) 50 MCG tablet Take 50 mcg by mouth every evening.   12/20/2018 at 1800  . lisinopril (ZESTRIL) 2.5 MG tablet Take 2.5 mg by mouth at bedtime.   12/20/2018 at 2000  . metoprolol tartrate (LOPRESSOR) 25 MG tablet Take 0.5 tablets (12.5 mg total) by mouth daily. 45 tablet 3 12/20/2018 at 1800  . rosuvastatin (CRESTOR) 20 MG tablet Take 1 tablet (20 mg total) by mouth daily. (Patient taking differently: Take 10 mg by mouth every evening. ) 90 tablet 3 12/20/2018 at 1800  . tamsulosin (FLOMAX) 0.4 MG CAPS capsule Take 0.8 mg by mouth at bedtime.   12/20/2018 at 2000  . warfarin (COUMADIN) 1 MG tablet Take 3 and a half tablets by mouth daily (or as directed) (Patient taking differently: Take 3-4 mg by mouth daily at 6 PM. Take 3 mg by mouth on Sunday, Tuesday, Thursday, Friday and Saturday. Take 4 mg on Monday and Wednesday) 100 tablet 3 12/20/2018 at 1800  . amiodarone (PACERONE) 200 MG tablet Take 1 tablet (200 mg total) by mouth daily. (Patient not taking:  Reported on 12/21/2018) 90 tablet 3 Not Taking at Unknown time  . carbamazepine (TEGRETOL) 200 MG tablet Take 1 1/2 tabs by mouth twice daily (Patient not taking: Reported on 12/21/2018) 270 tablet 3 Not Taking at Unknown time  . docusate sodium (COLACE) 100 MG capsule Take 200 mg by mouth daily.     prn at prn  . levothyroxine (LEVOTHROID) 25 MCG tablet Take 1 tablet (25 mcg total) by mouth daily. 90 tablet 3   . LORazepam (ATIVAN) 0.5 MG tablet Take one half tablet by mouth daily. (Patient not taking: Reported on 12/21/2018) 45 tablet 1 Not Taking at Unknown time  . QUEtiapine (SEROQUEL) 25 MG tablet Take 1 tablet (25 mg total) by  mouth 2 (two) times daily. (Patient not taking: Reported on 12/21/2018) 180 tablet 1 Not Taking at Unknown time  . sertraline (ZOLOFT) 25 MG tablet Take 1 tablet (25 mg total) by mouth daily. (Patient not taking: Reported on 12/21/2018) 90 tablet 3 Not Taking at Unknown time  . sorbitol 70 % solution Take 2 tablespoonfuls every two hours as needed.    prn at prn   Scheduled:  . hydrogen peroxide   Topical BID  . levothyroxine  50 mcg Oral QPM  . metoprolol tartrate  25 mg Oral BID  . senna-docusate  1 tablet Oral BID  . sodium chloride flush  3 mL Intravenous Q12H  . sodium chloride flush  5 mL Intracatheter Q8H  . tamsulosin  0.8 mg Oral QHS  . Warfarin - Pharmacist Dosing Inpatient   Does not apply q1800   Infusions:  . sodium chloride 500 mL (12/26/18 0411)  . amiodarone 30 mg/hr (12/26/18 0025)  . ampicillin-sulbactam (UNASYN) IV Stopped (12/26/18 0442)   PRN: sodium chloride, acetaminophen **OR** acetaminophen, albuterol, HYDROcodone-acetaminophen, morphine injection, ondansetron **OR** ondansetron (ZOFRAN) IV, sodium chloride flush Anti-infectives (From admission, onward)   Start     Dose/Rate Route Frequency Ordered Stop   12/24/18 2200  Ampicillin-Sulbactam (UNASYN) 3 g in sodium chloride 0.9 % 100 mL IVPB     3 g 200 mL/hr over 30 Minutes Intravenous Every 6 hours 12/24/18 1516     12/21/18 2200  piperacillin-tazobactam (ZOSYN) IVPB 3.375 g  Status:  Discontinued     3.375 g 12.5 mL/hr over 240 Minutes Intravenous Every 8 hours 12/21/18 1522 12/24/18 1516   12/21/18 1145  piperacillin-tazobactam (ZOSYN) IVPB 3.375 g     3.375 g 100 mL/hr over 30 Minutes Intravenous  Once 12/21/18 1144 12/21/18 1402   12/21/18 1115  metroNIDAZOLE (FLAGYL) tablet 500 mg     500 mg Oral  Once 12/21/18 1104 12/21/18 1347      Assessment: Pharmacy was consulted to restart anticoagulant. Patient was admitted with an INR of 5.6 and received Vit K on 6/17 for percutaneous cholecystostomy with  IR.  Home dose: Take 3 mg by mouth on Sunday, Tuesday, Thursday, Friday and Saturday. Take 4 mg on Monday and Wednesday.   DDI: pt has been started on amiodarone.   Date INR Warfarin Dose  6/17 5.6 Held  6/18 1.4 Held  6/19  3 mg   6/20 2.1 1 mg  6/21 2.8 Held   6/22 2.9 Held     Goal of Therapy:  INR 2-3 Monitor platelets by anticoagulation protocol: Yes   Plan:  INR therapeutic. INR continues to rapidly increase. Will hold dose on 6/22 and obtain INR with am labs.   Patient on Amiodarone infusion and Unasyn. Daily INR ordered. CBC ordered.  If pt is sent home on amiodarone may need to half the weekly dose of warfarin.   Pharmacy will continue to monitor and adjust per consult.   Ronnald RampKishan S Josiyah Tozzi, PharmD, BCPS  12/26/2018,8:09 AM

## 2018-12-26 NOTE — NC FL2 (Addendum)
[place] LEVEL OF CARE SCREENING TOOL     IDENTIFICATION  Patient Name: Darrell Hayes Birthdate: Mar 21, 1941 Sex: male Admission Date (Current Location): 12/21/2018  [place] and [place] Number:  Engineering geologist and Address:  [facility], [address] [place], [place] [address]      Provider Number: [id number]  Attending Physician Name and Address:  Fritzi Mandes, MD  Relative Name and Phone Number:  Leanora Ivanoff Daughter [phone]    Current Level of Care: Hospital Recommended Level of Care: [place] Prior Approval Number:    Date Approved/Denied:   PASRR Number:  [id number] Discharge Plan: SNF    Current Diagnoses: Patient Active Problem List   Diagnosis Date Noted  . Sepsis ([place]) 12/21/2018  . ARF (acute renal failure) ([place]) 01/05/2011  . Altered mental status 12/23/2010  . BACK PAIN 09/15/2010  . PRESSURE ULCER BUTTOCK 07/31/2010  . CEREBROVASCULAR DISEASE 06/12/2010  . DEPRESSION 04/08/2010  . HEPATITIS 04/08/2010  . SEIZURE DISORDER 04/08/2010  . Personal history of unspecified circulatory disease 04/08/2010  . UTI'S, HX OF 04/08/2010  . CHICKENPOX, HX OF 04/08/2010  . OTHER AND UNSPECIFIED HYPERLIPIDEMIA 06/14/2009  . UNSPEC ALVEOLAR&PARIETOALVEOLAR PNEUMONOPATHY 06/14/2009  . ABNORMAL PULMONARY TEST RESULTS 06/14/2009  . PULMONARY NODULE 05/28/2009  . HYPOPOTASSEMIA 05/08/2009  . CAD 04/30/2009  . Nonspecific (abnormal) findings on radiological and other examination of body structure 04/30/2009  . COMPUTERIZED TOMOGRAPHY, CHEST, ABNORMAL 04/30/2009  . CHEST PAIN 09/25/2008  . HYPERCHOLESTEROLEMIA, PURE 09/11/2008  . Shortness of breath 09/11/2008  . HYPERTENSION 09/10/2008  . ATRIAL FIBRILLATION 09/10/2008  . CVA 09/10/2008    Orientation RESPIRATION BLADDER Height & Weight        Normal Continent Weight: 113.4 kg Height:  5\' 8"  (172.7 cm)  BEHAVIORAL SYMPTOMS/MOOD NEUROLOGICAL  BOWEL NUTRITION STATUS      Continent Diet(Soft-thin fluid)  AMBULATORY STATUS COMMUNICATION OF NEEDS Skin   Extensive Assist Verbally Other (Comment)(no pressure ulcer to right knee)                       Personal Care Assistance Level of Assistance  Bathing, Feeding, Dressing Bathing Assistance: Limited assistance Feeding assistance: Limited assistance Dressing Assistance: Limited assistance     Functional Limitations Info  Sight, Hearing, Speech Sight Info: Adequate Hearing Info: Adequate Speech Info: Adequate    SPECIAL CARE FACTORS FREQUENCY  PT (By licensed PT)     PT Frequency: 5 x week              Contractures Contractures Info: Not present    Additional Factors Info  Code Status Code Status Info: partial                Discharge Medications: Please see discharge summary for a list of discharge medications. Medication List    STOP taking these medications   carbamazepine 200 MG tablet Commonly known as: TEGRETOL   LORazepam 0.5 MG tablet Commonly known as: ATIVAN   QUEtiapine 25 MG tablet Commonly known as: SEROQUEL   sertraline 25 MG tablet Commonly known as: ZOLOFT     TAKE these medications   albuterol (2.5 MG/3ML) 0.083% nebulizer solution Commonly known as: PROVENTIL Take 2.5 mg by nebulization every 4 (four) hours as needed for wheezing or shortness of breath.   amiodarone 200 MG tablet Commonly known as: PACERONE Take 400 mg bid for week 1 then, Take 400 mg daily for week 2 then, Take 200 mg daily What  changed:   how much to take  how to take this  when to take this  additional instructions   amoxicillin-clavulanate 875-125 MG tablet Commonly known as: AUGMENTIN Take 1 tablet by mouth every 12 (twelve) hours.   cholecalciferol 1000 units tablet Commonly known as: VITAMIN D Take 1,000 Units by mouth daily.   collagenase ointment Commonly known as: SANTYL Apply topically daily.   docusate  sodium 100 MG capsule Commonly known as: COLACE Take 200 mg by mouth daily.   HYDROcodone-acetaminophen 5-325 MG tablet Commonly known as: NORCO/VICODIN Take 1 tablet by mouth every 6 (six) hours as needed for moderate pain.   levothyroxine 50 MCG tablet Commonly known as: SYNTHROID Take 50 mcg by mouth every evening. What changed: Another medication with the same name was removed. Continue taking this medication, and follow the directions you see here.   lisinopril 2.5 MG tablet Commonly known as: ZESTRIL Take 2.5 mg by mouth at bedtime.   metoprolol tartrate 25 MG tablet Commonly known as: LOPRESSOR Take 0.5 tablets (12.5 mg total) by mouth daily.   rosuvastatin 20 MG tablet Commonly known as: CRESTOR Take 1 tablet (20 mg total) by mouth daily. What changed:   how much to take  when to take this   sorbitol 70 % solution Take 2 tablespoonfuls every two hours as needed.   tamsulosin 0.4 MG Caps capsule Commonly known as: FLOMAX Take 0.8 mg by mouth at bedtime.   warfarin 1 MG tablet Commonly known as: COUMADIN Take 2 mg Monday to Friday and Take 1 mg sat and sunday What changed: additional instructions     Relevant Imaging Results:  Relevant Lab Results:   Additional Information SSN: [id number]  Sherren KernsJennifer L Haidyn Chadderdon, RN

## 2018-12-26 NOTE — Plan of Care (Signed)
  Problem: Fluid Volume: Goal: Hemodynamic stability will improve Outcome: Adequate for Discharge   Problem: Clinical Measurements: Goal: Diagnostic test results will improve Outcome: Adequate for Discharge Goal: Signs and symptoms of infection will decrease Outcome: Adequate for Discharge   Problem: Respiratory: Goal: Ability to maintain adequate ventilation will improve Outcome: Adequate for Discharge   Problem: Nutrition Goal: Nutritional status is improving Description: Monitor and assess patient for malnutrition (ex- brittle hair, bruises, dry skin, pale skin and conjunctiva, muscle wasting, smooth red tongue, and disorientation). Collaborate with interdisciplinary team and initiate plan and interventions as ordered.  Monitor patient's weight and dietary intake as ordered or per policy. Utilize nutrition screening tool and intervene per policy. Determine patient's food preferences and provide high-protein, high-caloric foods as appropriate.  Outcome: Adequate for Discharge   Problem: Health Behavior/Discharge Planning: Goal: Ability to manage health-related needs will improve Outcome: Adequate for Discharge   Problem: Clinical Measurements: Goal: Will remain free from infection Outcome: Adequate for Discharge Goal: Diagnostic test results will improve Outcome: Adequate for Discharge   Problem: Elimination: Goal: Will not experience complications related to bowel motility Outcome: Adequate for Discharge Goal: Will not experience complications related to urinary retention Outcome: Adequate for Discharge   Problem: Pain Managment: Goal: General experience of comfort will improve Outcome: Adequate for Discharge   Problem: Safety: Goal: Ability to remain free from injury will improve Outcome: Adequate for Discharge   Problem: Skin Integrity: Goal: Risk for impaired skin integrity will decrease Outcome: Adequate for Discharge   Problem: Education: Goal: Knowledge of  disease or condition will improve Outcome: Adequate for Discharge Goal: Understanding of medication regimen will improve Outcome: Adequate for Discharge Goal: Individualized Educational Video(s) Outcome: Adequate for Discharge   Problem: Cardiac: Goal: Ability to achieve and maintain adequate cardiopulmonary perfusion will improve Outcome: Adequate for Discharge   Problem: Education: Goal: Knowledge of General Education information will improve Description: Including pain rating scale, medication(s)/side effects and non-pharmacologic comfort measures Outcome: Adequate for Discharge

## 2018-12-26 NOTE — Progress Notes (Signed)
Patient ID: Darrell Hayes, male   DOB: December 13, 1940, 78 y.o.   MRN: [id number] patient will discharged to [facility] today

## 2018-12-26 NOTE — Progress Notes (Signed)
PT Cancellation Note  Patient Details Name: Darrell Hayes MRN: [id number] DOB: [birth year]   Cancelled Treatment:    Reason Eval/Treat Not Completed: Other (comment). 1st attempt, pt eating breakfast, asking to return. Upon returning, daughter in room now agreeable to SNF placement and asking to let Dr. Posey Pronto know updated status. PT continues to recommends SNF placement. Will defer treatment at this time as evaluation already complete. Will re-attempt next date.   Justin Meisenheimer 12/26/2018, 10:13 AM  Greggory Stallion, PT, DPT [phone]

## 2018-12-26 NOTE — TOC Transition Note (Signed)
Transition of Care [facility]) - CM/SW Discharge Note   Patient Details  Name: Darrell Hayes MRN: [id number] Date of Birth: [birth year]  Transition of Care [facility]) CM/SW Contact:  Elza Rafter, RN Phone Number: 12/26/2018, 2:58 PM   Clinical Narrative:   Patient will discharge today to [facility].  [facility] will provide transport.      Final next level of care: Skilled Nursing Facility Barriers to Discharge: No Barriers Identified   Patient Goals and CMS Choice        Discharge Placement                       Discharge Plan and Services   Discharge Planning Services: CM Consult                                 Social Determinants of Health (SDOH) Interventions     Readmission Risk Interventions No flowsheet data found.

## 2018-12-26 NOTE — Discharge Instructions (Signed)
Knee dressing --Apply 1/4" inch layer of Santyl to the right knee, cover with 2x2 saline moist, cover with foam for protection.  Reapply Santyl daily and change gauze, ok to change foam dressing every 3 days.

## 2018-12-26 NOTE — Care Management Important Message (Signed)
Important Message  Patient Details  Name: Darrell Hayes MRN: [id number] Date of Birth: Sep 17, 1940   Medicare Important Message Given:  Yes     Dannette Barbara 12/26/2018, 11:34 AM

## 2018-12-26 NOTE — Discharge Summary (Signed)
[facility] [place] [facility]   PATIENT NAME: Darrell Hayes    MR#:  [id number]  DATE OF BIRTH:  [birth year]  DATE OF ADMISSION:  12/21/2018 ADMITTING PHYSICIAN: Shaune Pollack, MD  DATE OF DISCHARGE: 12/26/2018  PRIMARY CARE PHYSICIAN: Unice Bailey, MD    ADMISSION DIAGNOSIS:  RUQ abdominal pain [R10.11] AKI (acute kidney injury) (HCC) [N17.9] Hypotension, unspecified hypotension type [I95.9] Sepsis (HCC) [A41.9]  DISCHARGE DIAGNOSIS:  sepsis on admission -- resolved  acute cholecystitis status post percutaneous cholecystostomy drain placement rapid a fib with RVR acute on chronic-- patient on oral anticoagulation UTI-- Enterococcus faecalis acute on chronic kidney injury-- stage III right knee abrasion status post fall at home prior to admission SECONDARY DIAGNOSIS:   Past Medical History:  Diagnosis Date  . Agitation    and irritability, situational exacerbation  . Alveolar/parietoalveolar pneumonopathy, other    06/14/09 CT chest, Mild LLL GGO; 02/28/10 CT chest , improved  . Atrial fibrillation (HCC)    Coumadin therapy - Dr. Jens Som.  Cardioversion 08/23/09, Amiodarone Rx since 09/03/2009  . CAD (coronary artery disease) 09/2008   Dr. Jens Som, stent to LAD  . CVA (cerebral vascular accident) (HCC) 2007   In the setting of atrial fibrillation  . Dyspnea 09/2008   Class 3, since 2008.  Significant tachycardia.  No PE or fibrosis on CT chest 3/10, 12/10, 02/2010, normal PFT but isolated low DLCO  06/2009, normal hgb, TSH, creat and albumin , Dec 2010.  Resolved after A Fib cardioversion and Amiodarone Marh 2011  . Hepatitis   . Hiatal hernia   . History of chickenpox   . Hyperlipidemia   . Hypertension   . Nonspecific abnormal results of pulmonary system function study   . Pulmonary nodules 09/2008   Nonspecific reticulonodular nodules RLL superior segment, resolved on CT 06/14/09  . Seizures (HCC) 12/2008   Dr. Sela Hilding  COURSE:  Selig Darrell Hayes  is a 78 y.o. male with a known history of CAD, CVA, hypertension, hyperlipidemia, seizure disorder and A. fib came in with abdominal pain and admitted with sepsis.  *Sepsis present on admission due to acute infection-resolved  *Acute cholecystitis.   - On IV Zosyn--change to IV unasyn (cover enterococcus in urine)--now on po augmentin -HIDA scan with no dye entry into the cystic duct.   -s/p percutaneous cholecystostomy by  IR. -LFT's, bilirubin and WBC improved -GB fluid no bacteria so far  *UTI -on IV unasyn--change to oral augmentin no I like this because I see this I see this is usually pharmacist -UC enterococcus feacalis  *Atrial fibrillation with rapid ventricular rate acute on chronic - on metoprolol and amiodarone--taper initiated -seen by Dr Lezlie Octave over the weekend -Patient on Coumadin--INR 2.9  -patient will discharged on Coumadin 2 mg a day through Friday and 1 mg Saturday and Sunday. His INR will be checked by [facility].--- Spoke with nurse practitioner Olegario Messier  *Acute kidney injury over CKD stage III due to hypotension/ATN/infection. - received IV fluids.   -creat better  *Anemia of chronic disease. -stable  *Right knee wound/abraision and joint pain due to fall Orthopedics consulted.  Appreciate Dr. Rondel Baton input.  Advise dressing changes.   PT recommends rehab versus 24 hour home health with supervision. Daughter is in the room. She wants to take patient home. Patient is adamant for going home today.  Patient gets services thru [facility] at home. Spoke with Energy manager to discuss with family and provide additional  assistance needed.  Spoke with [place] practitioner with [facility] program and updated her regarding patient's hospital course. CONSULTS OBTAINED:  Treatment Team:  Olean Ree, MD Earnestine Leys, MD  DRUG ALLERGIES:  No Known Allergies  DISCHARGE MEDICATIONS:   Allergies as of 12/26/2018   No Known  Allergies     Medication List    STOP taking these medications   carbamazepine 200 MG tablet Commonly known as: TEGRETOL   LORazepam 0.5 MG tablet Commonly known as: ATIVAN   QUEtiapine 25 MG tablet Commonly known as: SEROQUEL   sertraline 25 MG tablet Commonly known as: ZOLOFT     TAKE these medications   albuterol (2.5 MG/3ML) 0.083% nebulizer solution Commonly known as: PROVENTIL Take 2.5 mg by nebulization every 4 (four) hours as needed for wheezing or shortness of breath.   amiodarone 200 MG tablet Commonly known as: PACERONE Take 400 mg bid for week 1 then, Take 400 mg daily for week 2 then, Take 200 mg daily What changed:   how much to take  how to take this  when to take this  additional instructions   amoxicillin-clavulanate 875-125 MG tablet Commonly known as: AUGMENTIN Take 1 tablet by mouth every 12 (twelve) hours.   cholecalciferol 1000 units tablet Commonly known as: VITAMIN D Take 1,000 Units by mouth daily.   collagenase ointment Commonly known as: SANTYL Apply topically daily.   docusate sodium 100 MG capsule Commonly known as: COLACE Take 200 mg by mouth daily.   HYDROcodone-acetaminophen 5-325 MG tablet Commonly known as: NORCO/VICODIN Take 1 tablet by mouth every 6 (six) hours as needed for moderate pain.   levothyroxine 50 MCG tablet Commonly known as: SYNTHROID Take 50 mcg by mouth every evening. What changed: Another medication with the same name was removed. Continue taking this medication, and follow the directions you see here.   lisinopril 2.5 MG tablet Commonly known as: ZESTRIL Take 2.5 mg by mouth at bedtime.   metoprolol tartrate 25 MG tablet Commonly known as: LOPRESSOR Take 0.5 tablets (12.5 mg total) by mouth daily.   rosuvastatin 20 MG tablet Commonly known as: CRESTOR Take 1 tablet (20 mg total) by mouth daily. What changed:   how much to take  when to take this   sorbitol 70 % solution Take 2  tablespoonfuls every two hours as needed.   tamsulosin 0.4 MG Caps capsule Commonly known as: FLOMAX Take 0.8 mg by mouth at bedtime.   warfarin 1 MG tablet Commonly known as: COUMADIN Take 2 mg Monday to Friday and Take 1 mg sat and sunday What changed: additional instructions       If you experience worsening of your admission symptoms, develop shortness of breath, life threatening emergency, suicidal or homicidal thoughts you must seek medical attention immediately by calling 911 or calling your MD immediately  if symptoms less severe.  You Must read complete instructions/literature along with all the possible adverse reactions/side effects for all the Medicines you take and that have been prescribed to you. Take any new Medicines after you have completely understood and accept all the possible adverse reactions/side effects.   Please note  You were cared for by a hospitalist during your hospital stay. If you have any questions about your discharge medications or the care you received while you were in the hospital after you are discharged, you can call the unit and asked to speak with the hospitalist on call if the hospitalist that took care of you is not available.  Once you are discharged, your primary care physician will handle any further medical issues. Please note that NO REFILLS for any discharge medications will be authorized once you are discharged, as it is imperative that you return to your primary care physician (or establish a relationship with a primary care physician if you do not have one) for your aftercare needs so that they can reassess your need for medications and monitor your lab values. Today   SUBJECTIVE   I want to go home today--I am fine dter in the room  VITAL SIGNS:  Blood pressure 123/72, pulse 78, temperature 97.6 F (36.4 C), temperature source Oral, resp. rate 17, height 5\' 8"  (1.727 m), weight 113.4 kg, SpO2 97 %.  I/O:    Intake/Output Summary  (Last 24 hours) at 12/26/2018 0929 Last data filed at 12/26/2018 0759 Gross per 24 hour  Intake 529.91 ml  Output 1035 ml  Net -505.09 ml    PHYSICAL EXAMINATION:  GENERAL:  78 y.o.-year-old patient lying in the bed with no acute distress.  Obesity. EYES: Pupils equal, round, reactive to light and accommodation. No scleral icterus. Extraocular muscles intact.  HEENT: Head atraumatic, normocephalic. Oropharynx and nasopharynx clear.  NECK:  Supple, no jugular venous distention. No thyroid enlargement, no tenderness.  LUNGS: Normal breath sounds bilaterally, no wheezing, rales,rhonchi or crepitation. No use of accessory muscles of respiration.  CARDIOVASCULAR: S1, S2 normal. No murmurs, rubs, or gallops.  ABDOMEN: Soft, tenderness on RUQ, nondistended. Bowel sounds present. No organomegaly or mass. Cholecystostomy drain+ EXTREMITIES: right knee dressing+ NEUROLOGIC: Cranial nerves II through XII are intact. Muscle strength 4/5 in all extremities. Sensation intact. Gait not checked.  PSYCHIATRIC: The patient is alert and oriented x 1-2.  SKIN: No obvious rash, lesion, or ulcer.    DATA REVIEW:   CBC  Recent Labs  Lab 12/25/18 0442  WBC 8.3  HGB 9.4*  HCT 28.3*  PLT 339    Chemistries  Recent Labs  Lab 12/22/18 0303  12/24/18 0549  NA 137   < > 136  K 4.2   < > 3.8  CL 110   < > 108  CO2 18*   < > 20*  GLUCOSE 117*   < > 150*  BUN 51*   < > 23  CREATININE 2.72*   < > 1.57*  CALCIUM 7.6*   < > 7.7*  MG 2.5*  --   --   AST  --    < > 73*  ALT  --    < > 92*  ALKPHOS  --    < > 131*  BILITOT  --    < > 0.8   < > = values in this interval not displayed.    Microbiology Results   Recent Results (from the past 240 hour(s))  Blood Culture (routine x 2)     Status: None   Collection Time: 12/21/18  9:56 AM   Specimen: BLOOD  Result Value Ref Range Status   Specimen Description BLOOD LEFT ANTECUBITAL  Final   Special Requests   Final    BOTTLES DRAWN AEROBIC AND  ANAEROBIC Blood Culture adequate volume   Culture   Final    NO GROWTH 5 DAYS Performed at [facility], [address] [place], [place] [address]    Report Status 12/26/2018 FINAL  Final  Blood Culture (routine x 2)     Status: None   Collection Time: 12/21/18  9:56 AM   Specimen: BLOOD  Result Value Ref Range Status   Specimen Description BLOOD RIGHT ANTECUBITAL  Final   Special Requests   Final    BOTTLES DRAWN AEROBIC AND ANAEROBIC Blood Culture results may not be optimal due to an excessive volume of blood received in culture bottles   Culture   Final    NO GROWTH 5 DAYS Performed at [facility], [address] [place], [place] [address]    Report Status 12/26/2018 FINAL  Final  Urine culture     Status: Abnormal   Collection Time: 12/21/18 12:23 PM   Specimen: Urine, Clean Catch  Result Value Ref Range Status   Specimen Description   Final    URINE, CLEAN CATCH Performed at [facility], [address] [place], [place] [address]    Special Requests   Final    NONE Performed at [facility], [address] [place], [place] [address]    Culture >=100,000 COLONIES/mL ENTEROCOCCUS FAECALIS (A)  Final   Report Status 12/23/2018 FINAL  Final   Organism ID, Bacteria ENTEROCOCCUS FAECALIS (A)  Final      Susceptibility   Enterococcus faecalis - MIC*    AMPICILLIN <=2 SENSITIVE Sensitive     LEVOFLOXACIN >=8 RESISTANT Resistant     NITROFURANTOIN <=16 SENSITIVE Sensitive     VANCOMYCIN 1 SENSITIVE Sensitive     * >=100,000 COLONIES/mL ENTEROCOCCUS FAECALIS  SARS Coronavirus 2 (CEPHEID - Performed in [facility] hospital lab), Hosp Order     Status: None   Collection Time: 12/21/18  1:50 PM   Specimen: Nasopharyngeal Swab  Result Value Ref Range Status   SARS Coronavirus 2 NEGATIVE NEGATIVE Final    Comment: (NOTE) If result is NEGATIVE SARS-CoV-2 target nucleic acids are NOT DETECTED. The SARS-CoV-2 RNA is generally detectable  in upper and lower  respiratory specimens during the acute phase of infection. The lowest  concentration of SARS-CoV-2 viral copies this assay can detect is 250  copies / mL. A negative result does not preclude SARS-CoV-2 infection  and should not be used as the sole basis for treatment or other  patient management decisions.  A negative result may occur with  improper specimen collection / handling, submission of specimen other  than nasopharyngeal swab, presence of viral mutation(s) within the  areas targeted by this assay, and inadequate number of viral copies  (<250 copies / mL). A negative result must be combined with clinical  observations, patient history, and epidemiological information. If result is POSITIVE SARS-CoV-2 target nucleic acids are DETECTED. The SARS-CoV-2 RNA is generally detectable in upper and lower  respiratory specimens dur ing the acute phase of infection.  Positive  results are indicative of active infection with SARS-CoV-2.  Clinical  correlation with patient history and other diagnostic information is  necessary to determine patient infection status.  Positive results do  not rule out bacterial infection or co-infection with other viruses. If result is PRESUMPTIVE POSTIVE SARS-CoV-2 nucleic acids MAY BE PRESENT.   A presumptive positive result was obtained on the submitted specimen  and confirmed on repeat testing.  While 2019 novel coronavirus  (SARS-CoV-2) nucleic acids may be present in the submitted sample  additional confirmatory testing may be necessary for epidemiological  and / or clinical management purposes  to differentiate between  SARS-CoV-2 and other Sarbecovirus currently known to infect humans.  If clinically indicated additional testing with an alternate test  methodology [phone] is advised. The SARS-CoV-2 RNA is generally  detectable in upper and  lower respiratory sp ecimens during the acute  phase of infection. The expected result is  Negative. Fact Sheet for Patients:  BoilerBrush.com.cyhttps://www.fda.gov/media/136312/download Fact Sheet for Healthcare Providers: https://pope.com/https://www.fda.gov/media/136313/download This test is not yet approved or cleared by the [place] FDA and has been authorized for detection and/or diagnosis of SARS-CoV-2 by FDA under an Emergency Use Authorization (EUA).  This EUA will remain in effect (meaning this test can be used) for the duration of the COVID-19 declaration under Section 564(b)(1) of the Act, 21 U.S.C. section 360bbb-3(b)(1), unless the authorization is terminated or revoked sooner. Performed at [facility], [address] [place], [place] [address]   Aerobic/Anaerobic Culture (surgical/deep wound)     Status: None (Preliminary result)   Collection Time: 12/23/18  2:01 PM   Specimen: Gallbladder; Abscess  Result Value Ref Range Status   Specimen Description   Final    GALL BLADDER GALL Performed at [facility], [address] [place], [place] [address]    Special Requests   Final    NONE Performed at [facility], [address] [place], [place] [address]    Gram Stain   Final    RARE WBC PRESENT, PREDOMINANTLY PMN NO ORGANISMS SEEN    Culture   Final    NO GROWTH 2 DAYS NO ANAEROBES ISOLATED; CULTURE IN PROGRESS FOR 5 DAYS Performed at [facility], [address] [place], [place] [address]    Report Status PENDING  Incomplete    RADIOLOGY:  No results found.   CODE STATUS:     Code Status Orders  (From admission, onward)         Start     Ordered   12/21/18 1721  Limited resuscitation (code)  Continuous    Question Answer Comment  In the event of cardiac or respiratory ARREST: Initiate Code Blue, Call Rapid Response Yes   In the event of cardiac or respiratory ARREST: Perform CPR Yes   In the event of cardiac or respiratory ARREST: Perform Intubation/Mechanical Ventilation No   In the event of cardiac or respiratory ARREST: Use  NIPPV/BiPAp only if indicated Yes   In the event of cardiac or respiratory ARREST: Administer ACLS medications if indicated Yes   In the event of cardiac or respiratory ARREST: Perform Defibrillation or Cardioversion if indicated Yes      12/21/18 1720        Code Status History    This patient has a current code status but no historical code status.   Advance Care Planning Activity    Advance Directive Documentation     Most Recent Value  Type of Advance Directive  Healthcare Power of Attorney, Out of facility DNR (pink MOST or yellow form)  Pre-existing out of facility DNR order (yellow form or pink MOST form)  -  "MOST" Form in Place?  -      TOTAL TIME TAKING CARE OF THIS PATIENT: *40 minutes.    Enedina FinnerSona Corbin Falck M.D on 12/26/2018 at 9:29 AM  Between 7am to 6pm - Pager - [phone] After 6pm go to [email or web address] - password [email or web address]  [facility] [place] [facility]  Office  [phone]  CC: Primary care physician; Unice Baileyosenberg, Beth S, MD

## 2018-12-26 NOTE — Progress Notes (Signed)
Patient ID: Darrell Hayes, male   DOB: 05/05/1941, 78 y.o.   MRN: [id number] patient's daughter feels she will not be able to handle patient's care at home. She is now requesting patient go to rehab. I will inform social Industrial/product designer for discharge planning. Patient is medically best at baseline for discharge.

## 2018-12-26 NOTE — Progress Notes (Signed)
Discharged via [facility] transport to [facility].  Report called to Elmer.  Daughter took the 70 of his belonging home.  Included in his packet, the Santyl we are using on his wound.

## 2018-12-28 DIAGNOSIS — K810 Acute cholecystitis: Secondary | ICD-10-CM

## 2018-12-28 DIAGNOSIS — R1011 Right upper quadrant pain: Secondary | ICD-10-CM

## 2018-12-28 LAB — AEROBIC/ANAEROBIC CULTURE W GRAM STAIN (SURGICAL/DEEP WOUND): Culture: NO GROWTH

## 2019-01-02 NOTE — Progress Notes (Deleted)
Cardiology Office Note Date:  01/02/2019  Patient ID:  Darrell Hayes, Darrell Hayes 10/21/1940, MRN [id number] PCP:  Janna Arch, MD  Cardiologist:  Dr. Rockey Situ, MD previously evaluated by Dr. Stanford Breed in 2012  ***refresh   Chief Complaint: Hospital follow-up  History of Present Illness: Darrell Hayes is a 78 y.o. male with history of ***   Past Medical History:  Diagnosis Date  . Agitation    and irritability, situational exacerbation  . Alveolar/parietoalveolar pneumonopathy, other    06/14/09 CT chest, Mild LLL GGO; 02/28/10 CT chest , improved  . Atrial fibrillation (HCC)    Coumadin therapy - Dr. Stanford Breed.  Cardioversion 08/23/09, Amiodarone Rx since 09/03/2009  . CAD (coronary artery disease) 09/2008   Dr. Stanford Breed, stent to LAD  . CVA (cerebral vascular accident) ([place]) 2007   In the setting of atrial fibrillation  . Dyspnea 09/2008   Class 3, since 2008.  Significant tachycardia.  No PE or fibrosis on CT chest 3/10, 12/10, 02/2010, normal PFT but isolated low DLCO  06/2009, normal hgb, TSH, creat and albumin , Dec 2010.  Resolved after A Fib cardioversion and Amiodarone Marh 2011  . Hepatitis   . Hiatal hernia   . History of chickenpox   . Hyperlipidemia   . Hypertension   . Nonspecific abnormal results of pulmonary system function study   . Pulmonary nodules 09/2008   Nonspecific reticulonodular nodules RLL superior segment, resolved on CT 06/14/09  . Seizures ([place]) 12/2008   Dr. Gaynell Face    Past Surgical History:  Procedure Laterality Date  . APPENDECTOMY    . IR PERC CHOLECYSTOSTOMY  12/23/2018  . Stent placed  09/2008    No outpatient medications have been marked as taking for the 01/04/19 encounter (Appointment) with Rise Mu, PA-C.    Allergies:   Patient has no known allergies.   Social History:  The patient  reports that he quit smoking about 12 years ago. His smoking use included cigarettes and pipe. He quit after 32.00 years of use. He has never used  smokeless tobacco. He reports that he does not drink alcohol.   Family History:  The patient's family history includes Arthritis in his mother; Heart disease in an other family member; Hyperlipidemia in his mother; Hypertension in his mother; Stroke in his father.  ROS:   ROS   PHYSICAL EXAM: *** VS:  There were no vitals taken for this visit. BMI: There is no height or weight on file to calculate BMI.  Physical Exam   EKG:  Was ordered and interpreted by me today. Shows ***  Recent Labs: 12/21/2018: TSH 3.576 12/22/2018: Magnesium 2.5 12/24/2018: ALT 92; BUN 23; Creatinine, Ser 1.57; Potassium 3.8; Sodium 136 12/25/2018: Hemoglobin 9.4; Platelets 339  No results found for requested labs within last 8760 hours.   Estimated Creatinine Clearance: 47.4 mL/min (A) (by C-G formula based on SCr of 1.57 mg/dL (H)).   Wt Readings from Last 3 Encounters:  12/21/18 250 lb (113.4 kg)  11/27/10 191 lb 12.8 oz (87 kg)  09/15/10 203 lb (92.1 kg)     Other studies reviewed: Additional studies/records reviewed today include: summarized above  ASSESSMENT AND PLAN:  1. ***  Disposition: F/u with Dr. Rockey Situ or an APP in ***.  Current medicines are reviewed at length with the patient today.  The patient did not have any concerns regarding medicines.  Signed, Christell Faith, PA-C 01/02/2019 2:15 PM     [id number] [place], [place] [address] [phone]

## 2019-01-04 ENCOUNTER — Ambulatory Visit: Payer: Medicare (Managed Care) | Admitting: Physician Assistant

## 2019-01-04 ENCOUNTER — Encounter: Payer: Self-pay | Admitting: Surgery

## 2019-01-04 ENCOUNTER — Ambulatory Visit (INDEPENDENT_AMBULATORY_CARE_PROVIDER_SITE_OTHER): Payer: Medicare (Managed Care) | Admitting: Surgery

## 2019-01-04 ENCOUNTER — Other Ambulatory Visit: Payer: Self-pay

## 2019-01-04 VITALS — BP 111/67 | HR 70 | Temp 97.0°F | Resp 16 | Ht 72.0 in

## 2019-01-04 DIAGNOSIS — K810 Acute cholecystitis: Secondary | ICD-10-CM

## 2019-01-04 NOTE — Patient Instructions (Addendum)
  Please see your follow up appointment listed below.  The Cholangiogram is scheduled 01/16/2019 @ 8:30 arrival @ [facility]. Please check in at the [facility] entrance.  You may eat/drink as normal there is no special prep for this procedure.    Gallbladder Eating Plan If you have a gallbladder condition, you may have trouble digesting fats. Eating a low-fat diet can help reduce your symptoms, and may be helpful before and after having surgery to remove your gallbladder (cholecystectomy). Your health care provider may recommend that you work with a diet and nutrition specialist (dietitian) to help you reduce the amount of fat in your diet. What are tips for following this plan? General guidelines  Limit your fat intake to less than 30% of your total daily calories. If you eat around 1,800 calories each day, this is less than 60 grams (g) of fat per day.  Fat is an important part of a healthy diet. Eating a low-fat diet can make it hard to maintain a healthy body weight. Ask your dietitian how much fat, calories, and other nutrients you need each day.  Eat small, frequent meals throughout the day instead of three large meals.  Drink at least 8-10 cups of fluid a day. Drink enough fluid to keep your urine clear or pale yellow.  Limit alcohol intake to no more than 1 drink a day for nonpregnant women and 2 drinks a day for men. One drink equals 12 oz of beer, 5 oz of wine, or 1 oz of hard liquor. Reading food labels  Check Nutrition Facts on food labels for the amount of fat per serving. Choose foods with less than 3 grams of fat per serving. Shopping  Choose nonfat and low-fat healthy foods. Look for the words "nonfat," "low fat," or "fat free."  Avoid buying processed or prepackaged foods. Cooking  Cook using low-fat methods, such as baking, broiling, grilling, or boiling.  Cook with small amounts of healthy fats, such as olive oil, grapeseed oil, canola oil, or sunflower oil.  What foods are recommended?   All fresh, frozen, or canned fruits and vegetables.  Whole grains.  Low-fat or non-fat (skim) milk and yogurt.  Lean meat, skinless poultry, fish, eggs, and beans.  Low-fat protein supplement powders or drinks.  Spices and herbs. What foods are not recommended?  High-fat foods. These include baked goods, fast food, fatty cuts of meat, ice cream, french toast, sweet rolls, pizza, cheese bread, foods covered with butter, creamy sauces, or cheese.  Fried foods. These include french fries, tempura, battered fish, breaded chicken, fried breads, and sweets.  Foods with strong odors.  Foods that cause bloating and gas. Summary  A low-fat diet can be helpful if you have a gallbladder condition, or before and after gallbladder surgery.  Limit your fat intake to less than 30% of your total daily calories. This is about 60 g of fat if you eat 1,800 calories each day.  Eat small, frequent meals throughout the day instead of three large meals. This information is not intended to replace advice given to you by your health care provider. Make sure you discuss any questions you have with your health care provider. Document Released: 06/27/2013 Document Revised: 10/13/2018 Document Reviewed: 07/30/2016 Elsevier Patient Education  2020 [employer].

## 2019-01-04 NOTE — Progress Notes (Signed)
01/04/2019  History of Present Illness: Darrell Hayes is a 78 y.o. male for follow-up from his hospitalization on 6/17 to 6/22 for acute cholecystitis.  On initial presentation, the patient was early sepsis with hypotension and tachycardia and elevated white blood cell count and a lactic acid of 2.4 and due to chronic renal failure and INR of 5.6 he had a HIDA scan which showed acute cholecystitis and ended up having a percutaneous cholecystostomy tube placed on 6/19 after his INR was reversed.  He presents today for follow-up.  Patient reports that he has been doing well.  His stepdaughter is with him during this visit and supplying most of the history.  He is currently in a nursing home but the patient is wanting to go home.  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  The patient reports feeling bloated with some soreness over the abdomen but appears comfortable when reoriented.  Past Medical History: Past Medical History:  Diagnosis Date  . Agitation    and irritability, situational exacerbation  . Alveolar/parietoalveolar pneumonopathy, other    06/14/09 CT chest, Mild LLL GGO; 02/28/10 CT chest , improved  . Atrial fibrillation (HCC)    Coumadin therapy - Dr. Jens Somrenshaw.  Cardioversion 08/23/09, Amiodarone Rx since 09/03/2009  . CAD (coronary artery disease) 09/2008   Dr. Jens Somrenshaw, stent to LAD  . CVA (cerebral vascular accident) (HCC) 2007   In the setting of atrial fibrillation  . Dyspnea 09/2008   Class 3, since 2008.  Significant tachycardia.  No PE or fibrosis on CT chest 3/10, 12/10, 02/2010, normal PFT but isolated low DLCO  06/2009, normal hgb, TSH, creat and albumin , Dec 2010.  Resolved after A Fib cardioversion and Amiodarone Marh 2011  . Hepatitis   . Hiatal hernia   . History of chickenpox   . Hyperlipidemia   . Hypertension   . Nonspecific abnormal results of pulmonary system function study   . Pulmonary nodules 09/2008   Nonspecific  reticulonodular nodules RLL superior segment, resolved on CT 06/14/09  . Seizures (HCC) 12/2008   Dr. Sharene SkeansHickling     Past Surgical History: Past Surgical History:  Procedure Laterality Date  . APPENDECTOMY    . IR PERC CHOLECYSTOSTOMY  12/23/2018  . Stent placed  09/2008    Home Medications: Prior to Admission medications   Medication Sig Start Date End Date Taking? Authorizing Provider  albuterol (PROVENTIL) (2.5 MG/3ML) 0.083% nebulizer solution Take 2.5 mg by nebulization every 4 (four) hours as needed for wheezing or shortness of breath.   Yes [provider]  amiodarone (PACERONE) 200 MG tablet Take 400 mg bid for week 1 then, Take 400 mg daily for week 2 then, Take 200 mg daily 12/26/18  Yes Enedina FinnerPatel, Sona, MD  amoxicillin-clavulanate (AUGMENTIN) 875-125 MG tablet Take 1 tablet by mouth every 12 (twelve) hours. 12/26/18  Yes Enedina FinnerPatel, Sona, MD  cholecalciferol (VITAMIN D) 1000 UNITS tablet Take 1,000 Units by mouth daily.     Yes [provider]  collagenase (SANTYL) ointment Apply topically daily. 12/26/18  Yes Enedina FinnerPatel, Sona, MD  docusate sodium (COLACE) 100 MG capsule Take 200 mg by mouth daily.     Yes [provider]  HYDROcodone-acetaminophen (NORCO/VICODIN) 5-325 MG tablet Take 1 tablet by mouth every 6 (six) hours as needed for moderate pain. 12/26/18  Yes Enedina FinnerPatel, Sona, MD  levothyroxine (SYNTHROID) 50 MCG tablet Take 50 mcg by mouth every evening.   Yes [provider]  lisinopril (ZESTRIL) 2.5 MG tablet Take 2.5 mg by mouth at bedtime.   Yes [provider]  metoprolol tartrate (LOPRESSOR) 25 MG tablet Take 0.5 tablets (12.5 mg total) by mouth daily. 04/14/11  Yes Joaquim Namuncan, Graham S, MD  rosuvastatin (CRESTOR) 20 MG tablet Take 1 tablet (20 mg total) by mouth daily. Patient taking differently: Take 10 mg by mouth every evening.  04/14/11  Yes Joaquim Namuncan, Graham S, MD  sorbitol 70 % solution Take 2 tablespoonfuls every two hours as needed.    Yes  [provider]  tamsulosin (FLOMAX) 0.4 MG CAPS capsule Take 0.8 mg by mouth at bedtime.   Yes [provider]  warfarin (COUMADIN) 1 MG tablet Take 2 mg Monday to Friday and Take 1 mg sat and sunday 12/26/18  Yes Enedina FinnerPatel, Sona, MD    Allergies: No Known Allergies  Review of Systems: Review of Systems  Constitutional: Negative for chills and fever.  Respiratory: Negative for shortness of breath.   Cardiovascular: Negative for chest pain.  Gastrointestinal: Positive for abdominal pain. Negative for nausea and vomiting.    Physical Exam BP 111/67   Pulse 70   Temp (!) 97 F (36.1 C) (Temporal)   Resp 16   Ht 6' (1.829 m)   SpO2 98%   BMI 33.91 kg/m  CONSTITUTIONAL: No acute distress HEENT:  Normocephalic, atraumatic, extraocular motion intact. RESPIRATORY:  Lungs are clear, and breath sounds are equal bilaterally. Normal respiratory effort without pathologic use of accessory muscles. CARDIOVASCULAR: Heart is regular without murmurs, gallops, or rubs. GI: The abdomen is soft, obese, nondistended, with some soreness to palpation that seems to be vague rather than localized.  Percutaneous cholecystostomy tube in place with bile in the bag.  NEUROLOGIC:  Motor and sensation is grossly normal.  Cranial nerves are grossly intact. PSYCH:  Alert and oriented to person, place and time. Affect is normal.  Labs/Imaging: None recently.  Cultures from percutaneous drainage showed no growth  Assessment and Plan: This is a 78 y.o. male with recent history of acute cholecystitis, presenting critical to the emergency room requiring percutaneous cholecystostomy tube surgery.  Discussed with the patient and his stepdaughter that currently the drain is helping us is and is doing what he needs to be doing.  The next step process is to obtain a cholangiogram through the drain which based on timing would be done on the week of July 13.  He will follow-up with me after that.  Based on  the results, we could discuss whether we have options for surgery versus removal of the drain versus keeping the drain in place.  Another step in the work-up for the future would be also needing medical clearance from the patient's cardiologist.  I do not see any prior echocardiograms even done on the records but he may have them on outside records.  He also needs to get stronger and get better protein calories and as he is currently still in a wheelchair and his prior baseline before all this happened was ambulatory.  Discussed with the patient and his stepdaughter as well that if we are planning on any surgery at all, it would not be for about 6 to 8 weeks on drain placements date although may need to be longer if the patient is not still up to baseline prior to surgery.  Face-to-face time spent with the patient and care providers was 25 minutes, with more than 50% of the time spent counseling, educating, and coordinating  care of the patient.     Melvyn Neth, [facility]

## 2019-01-16 ENCOUNTER — Other Ambulatory Visit: Payer: Self-pay

## 2019-01-16 ENCOUNTER — Ambulatory Visit
Admission: RE | Admit: 2019-01-16 | Discharge: 2019-01-16 | Disposition: A | Discharge status: Home / Self Care | Payer: Medicare (Managed Care) | Source: Ambulatory Visit | Attending: Surgery | Admitting: Surgery

## 2019-01-16 DIAGNOSIS — K810 Acute cholecystitis: Secondary | ICD-10-CM | POA: Diagnosis not present

## 2019-01-16 MED ORDER — SODIUM CHLORIDE (PF) 0.9 % IJ SOLN
10.0000 mL | Freq: Once | INTRAMUSCULAR | Status: AC
  Administered 2019-01-16: 10 mL

## 2019-01-16 MED ORDER — IOHEXOL 300 MG/ML  SOLN
30.0000 mL | Freq: Once | INTRAMUSCULAR | Status: AC | PRN
  Administered 2019-01-16: 30 mL

## 2019-01-20 ENCOUNTER — Encounter: Payer: Self-pay | Admitting: Surgery

## 2019-01-20 ENCOUNTER — Other Ambulatory Visit: Payer: Self-pay

## 2019-01-20 ENCOUNTER — Ambulatory Visit (INDEPENDENT_AMBULATORY_CARE_PROVIDER_SITE_OTHER): Payer: Medicare (Managed Care) | Admitting: Surgery

## 2019-01-20 VITALS — BP 100/70 | HR 89 | Temp 97.3°F | Resp 14 | Wt 170.0 lb

## 2019-01-20 DIAGNOSIS — K810 Acute cholecystitis: Secondary | ICD-10-CM

## 2019-01-20 NOTE — Patient Instructions (Signed)
Follow-up in 2 weeks

## 2019-01-20 NOTE — Progress Notes (Signed)
01/20/2019  History of Present Illness: Darrell Hayes is a 78 y.o. male presenting for follow-up after acute cholecystitis requiring percutaneous cholecystostomy tube on 6/19.  On initial presentation he was in early sepsis with hypotension, tachycardia, elevated white blood cell count, lactic acid of 2.4, acute on chronic renal failure, and INR of 5.6.  He was seen in the office on 01/04/2019 at which point he reported doing well.  His stepdaughter was accompanying him and denied any issues from the nursing home that she had been informed of.  Today the patient is accompanied by his daughter who I had met in the emergency room on his initial presentation.  She also reports that he has been doing well recently without any issues from the nursing home.  Apparently the nursing home was talking with them now about when to be able to send him back home the patient reports that he has been doing well and eating with no pain or nausea or fevers.  Past Medical History: Past Medical History:  Diagnosis Date  . Agitation    and irritability, situational exacerbation  . Alveolar/parietoalveolar pneumonopathy, other    06/14/09 CT chest, Mild LLL GGO; 02/28/10 CT chest , improved  . Atrial fibrillation (HCC)    Coumadin therapy - Dr. Stanford Breed.  Cardioversion 08/23/09, Amiodarone Rx since 09/03/2009  . CAD (coronary artery disease) 09/2008   Dr. Stanford Breed, stent to LAD  . CVA (cerebral vascular accident) ([place]) 2007   In the setting of atrial fibrillation  . Dyspnea 09/2008   Class 3, since 2008.  Significant tachycardia.  No PE or fibrosis on CT chest 3/10, 12/10, 02/2010, normal PFT but isolated low DLCO  06/2009, normal hgb, TSH, creat and albumin , Dec 2010.  Resolved after A Fib cardioversion and Amiodarone Marh 2011  . Hepatitis   . Hiatal hernia   . History of chickenpox   . Hyperlipidemia   . Hypertension   . Nonspecific abnormal results of pulmonary system function study   . Pulmonary nodules 09/2008   Nonspecific reticulonodular nodules RLL superior segment, resolved on CT 06/14/09  . Seizures ([place]) 12/2008   Dr. Gaynell Face     Past Surgical History: Past Surgical History:  Procedure Laterality Date  . APPENDECTOMY    . IR PERC CHOLECYSTOSTOMY  12/23/2018  . Stent placed  09/2008    Home Medications: Prior to Admission medications   Medication Sig Start Date End Date Taking? Authorizing Provider  albuterol (PROVENTIL) (2.5 MG/3ML) 0.083% nebulizer solution Take 2.5 mg by nebulization every 4 (four) hours as needed for wheezing or shortness of breath.   Yes [provider]  amiodarone (PACERONE) 200 MG tablet Take 400 mg bid for week 1 then, Take 400 mg daily for week 2 then, Take 200 mg daily 12/26/18  Yes Fritzi Mandes, MD  cholecalciferol (VITAMIN D) 1000 UNITS tablet Take 1,000 Units by mouth daily.     Yes [provider]  collagenase (SANTYL) ointment Apply topically daily. 12/26/18  Yes Fritzi Mandes, MD  docusate sodium (COLACE) 100 MG capsule Take 200 mg by mouth daily.     Yes [provider]  HYDROcodone-acetaminophen (NORCO/VICODIN) 5-325 MG tablet Take 1 tablet by mouth every 6 (six) hours as needed for moderate pain. 12/26/18  Yes Fritzi Mandes, MD  levothyroxine (SYNTHROID) 50 MCG tablet Take 50 mcg by mouth every evening.   Yes [provider]  lisinopril (ZESTRIL) 2.5 MG tablet Take 2.5 mg by mouth at bedtime.   Yes  [provider]  metoprolol tartrate (LOPRESSOR) 25 MG tablet Take 0.5 tablets (12.5 mg total) by mouth daily. 04/14/11  Yes Tonia Ghent, MD  rosuvastatin (CRESTOR) 20 MG tablet Take 1 tablet (20 mg total) by mouth daily. Patient taking differently: Take 10 mg by mouth every evening.  04/14/11  Yes Tonia Ghent, MD  sorbitol 70 % solution Take 2 tablespoonfuls every two hours as needed.    Yes [provider]  tamsulosin (FLOMAX) 0.4 MG CAPS capsule Take 0.8 mg by mouth at bedtime.   Yes [provider]  warfarin (COUMADIN) 1 MG tablet Take 2 mg Monday to Friday and Take 1 mg sat and sunday 12/26/18  Yes Fritzi Mandes, MD    Allergies: No Known Allergies  Review of Systems: Review of Systems  Constitutional: Negative for chills and fever.  Respiratory: Negative for shortness of breath.   Cardiovascular: Negative for chest pain.  Gastrointestinal: Negative for abdominal pain, nausea and vomiting.    Physical Exam BP 100/70   Pulse 89   Temp (!) 97.3 F (36.3 C)   Resp 14   Wt 170 lb (77.1 kg)   SpO2 97%   BMI 23.06 kg/m  CONSTITUTIONAL: No acute distress HEENT:  Normocephalic, atraumatic, extraocular motion intact. RESPIRATORY:  Lungs are clear, and breath sounds are equal bilaterally. Normal respiratory effort without pathologic use of accessory muscles. CARDIOVASCULAR: Heart is regular without murmurs, gallops, or rubs. GI: The abdomen is soft, nondistended, with reported tenderness even to soft touch of the abdomen although the patient does not appear uncomfortable on exam.  He has a right-sided percutaneous cholecystostomy tube which is draining some bile fluid which does not appear purulent or infected. There were no palpable masses. There was no hepatosplenomegaly. NEUROLOGIC:  Motor and sensation is grossly normal.  Cranial nerves are grossly intact. PSYCH:  Alert and oriented to person, place and time. Affect is normal.  Labs/Imaging: Cholangiogram 01/16/2019 IMPRESSION: Partial retraction of cholecystostomy tube which is still positioned partially in the fundus of the gallbladder and is draining bile. Cholangiogram demonstrates widely patent cystic duct, extrahepatic bile ducts and common bile duct with normal outflow of contrast into the duodenum. If the patient will not be proceeding with cholecystectomy, the cholecystostomy tube can likely be removed safely in a few weeks. The tube was left to gravity bag drainage but could also be capped for a trial of internal  drainage prior to removal.  Assessment and Plan: This is a 78 y.o. male with a history of acute cholecystitis requiring percutaneous cholecystostomy tube placement.  - Have extensive discussion with the patient and his daughter regarding what the next steps can be after his drain placement.  At this point the cholangiogram shows a patent cystic duct and common bile duct which gives [place] more options.  Discussed with them that one potential option would be to keep the drain as it is as it is helping and is allowing the bile to drain as needed.  Another potential option given that the cystic duct is patent would be to remove his drain in 2 weeks which will be a total of 6 weeks since placement.  However with the drain out, the patient would be at potential risk of having episodes of biliary colic or cholecystitis again.  The other option would be proceeding with surgery in the future at which point the drain will be removed - Discussed with them that given his age and health conditions, he is not  necessarily the most ideal candidate for surgery.  I would most definitely want both cardiology and his PCP to clear him for surgery given that he has a history of stroke, atrial fibrillation, coronary artery disease, chronic kidney disease, and is on Coumadin. - At this point no immediate decision needs to be made as we would not be able to remove the drain right now either.  The daughter will discuss with her siblings and the patient over the next couple of weeks to see which option they would want to go with.  I also mentioned to them that even if they do want to proceed with surgery, we would not be doing this until at least mid August and potentially even further out so that the patient can be at his peak mobility and strength again. - Patient will follow-up with me in 2 more weeks to further discuss options.  Face-to-face time spent with the patient and care providers was 40 minutes, with more than 50% of the  time spent counseling, educating, and coordinating care of the patient.     Melvyn Neth, [facility]

## 2019-02-03 ENCOUNTER — Ambulatory Visit: Payer: Medicare (Managed Care) | Admitting: Surgery

## 2019-02-10 ENCOUNTER — Encounter: Payer: Self-pay | Admitting: Surgery

## 2019-02-10 ENCOUNTER — Other Ambulatory Visit: Payer: Self-pay

## 2019-02-10 ENCOUNTER — Ambulatory Visit (INDEPENDENT_AMBULATORY_CARE_PROVIDER_SITE_OTHER): Payer: Medicare (Managed Care) | Admitting: Surgery

## 2019-02-10 VITALS — BP 130/86 | HR 74 | Temp 97.9°F | Ht 68.0 in | Wt 170.0 lb

## 2019-02-10 DIAGNOSIS — K810 Acute cholecystitis: Secondary | ICD-10-CM

## 2019-02-10 NOTE — Patient Instructions (Signed)
Patient to return in one month. Call us with any vomitting or pain

## 2019-02-10 NOTE — Progress Notes (Signed)
02/10/2019  History of Present Illness: Darrell Hayes is a 78 y.o. male presenting for follow-up after acute cholecystitis requiring percutaneous cholecystostomy tube on 6/19.  He was last seen on 7/17 and cholangiogram results were discussed.  Today he reports that he's doing well, without any pain.  He's eating well.  He continues working with PT and is able to do some walking but still weak.  The drain has been working well and his stepdaughter says it's been low volume.    Past Medical History: Past Medical History:  Diagnosis Date  . Agitation    and irritability, situational exacerbation  . Alveolar/parietoalveolar pneumonopathy, other    06/14/09 CT chest, Mild LLL GGO; 02/28/10 CT chest , improved  . Atrial fibrillation (HCC)    Coumadin therapy - Dr. Stanford Breed.  Cardioversion 08/23/09, Amiodarone Rx since 09/03/2009  . CAD (coronary artery disease) 09/2008   Dr. Stanford Breed, stent to LAD  . CVA (cerebral vascular accident) ([place]) 2007   In the setting of atrial fibrillation  . Dyspnea 09/2008   Class 3, since 2008.  Significant tachycardia.  No PE or fibrosis on CT chest 3/10, 12/10, 02/2010, normal PFT but isolated low DLCO  06/2009, normal hgb, TSH, creat and albumin , Dec 2010.  Resolved after A Fib cardioversion and Amiodarone Marh 2011  . Hepatitis   . Hiatal hernia   . History of chickenpox   . Hyperlipidemia   . Hypertension   . Nonspecific abnormal results of pulmonary system function study   . Pulmonary nodules 09/2008   Nonspecific reticulonodular nodules RLL superior segment, resolved on CT 06/14/09  . Seizures ([place]) 12/2008   Dr. Gaynell Face     Past Surgical History: Past Surgical History:  Procedure Laterality Date  . APPENDECTOMY    . IR PERC CHOLECYSTOSTOMY  12/23/2018  . Stent placed  09/2008    Home Medications: Prior to Admission medications   Medication Sig Start Date End Date Taking? Authorizing Provider  albuterol (PROVENTIL) (2.5 MG/3ML) 0.083% nebulizer  solution Take 2.5 mg by nebulization every 4 (four) hours as needed for wheezing or shortness of breath.   Yes [provider]  amiodarone (PACERONE) 200 MG tablet Take 400 mg bid for week 1 then, Take 400 mg daily for week 2 then, Take 200 mg daily 12/26/18  Yes Fritzi Mandes, MD  cholecalciferol (VITAMIN D) 1000 UNITS tablet Take 1,000 Units by mouth daily.     Yes [provider]  collagenase (SANTYL) ointment Apply topically daily. 12/26/18  Yes Fritzi Mandes, MD  docusate sodium (COLACE) 100 MG capsule Take 200 mg by mouth daily.     Yes [provider]  HYDROcodone-acetaminophen (NORCO/VICODIN) 5-325 MG tablet Take 1 tablet by mouth every 6 (six) hours as needed for moderate pain. 12/26/18  Yes Fritzi Mandes, MD  levothyroxine (SYNTHROID) 50 MCG tablet Take 50 mcg by mouth every evening.   Yes [provider]  lisinopril (ZESTRIL) 2.5 MG tablet Take 2.5 mg by mouth at bedtime.   Yes [provider]  metoprolol tartrate (LOPRESSOR) 25 MG tablet Take 0.5 tablets (12.5 mg total) by mouth daily. 04/14/11  Yes Tonia Ghent, MD  rosuvastatin (CRESTOR) 20 MG tablet Take 1 tablet (20 mg total) by mouth daily. Patient taking differently: Take 10 mg by mouth every evening.  04/14/11  Yes Tonia Ghent, MD  sorbitol 70 % solution Take 2 tablespoonfuls every two hours as needed.    Yes [provider]  tamsulosin [facility])  0.4 MG CAPS capsule Take 0.8 mg by mouth at bedtime.   Yes [provider]  warfarin (COUMADIN) 1 MG tablet Take 2 mg Monday to Friday and Take 1 mg sat and sunday 12/26/18  Yes Enedina FinnerPatel, Sona, MD    Allergies: No Known Allergies  Review of Systems: Review of Systems  Constitutional: Negative for chills and fever.  Respiratory: Negative for shortness of breath.   Cardiovascular: Negative for chest pain.  Gastrointestinal: Negative for abdominal pain, nausea and vomiting.    Physical Exam BP 130/86   Pulse 74   Temp  97.9 F (36.6 C) (Skin)   Ht 5\' 8"  (1.727 m)   Wt 170 lb (77.1 kg)   SpO2 96%   BMI 25.85 kg/m  CONSTITUTIONAL: No acute distress.  Able to stand up from his wheelchair with significant effort. HEENT:  Normocephalic, atraumatic, extraocular motion intact. RESPIRATORY:  Lungs are clear, and breath sounds are equal bilaterally. Normal respiratory effort without pathologic use of accessory muscles. CARDIOVASCULAR: Heart is regular without murmurs, gallops, or rubs. GI: The abdomen is soft, obese, non-distended, non-tender to palpation.  RUQ cholecystostomy tube in place, but the suture holding the tube in place is not securing to skin anymore, and the drain is not in the drain clip.  Tried flushing the drain but only leaks around the tube and causes discomfort.  As such, the drain was removed at bedside. Dry gauze dressing applied. NEUROLOGIC:  Motor and sensation is grossly normal.  Cranial nerves are grossly intact. PSYCH:  Alert and oriented to person, place and time. Affect is normal.  Labs/Imaging: None recently.  Assessment and Plan: This is a 78 y.o. male with hx of cholecystitis s/p percutaneous cholecystostomy tube placement.  Tube malfunctioning with fluid leaking around the tube and suture no longer secured to the skin.  Tube removed and dry gauze dressing applied.   Now that the drain is discontinued, we will have to see how the patient continues to do symptomatically.  Discussed return precautions with him and his stepdaughter.  He will follow up in a month to see how he's doing, how his strength is doing, and if any pain issues.  Discussed with them that if at the end they want surgery, he definitely needs to be back as his baseline strength and mobility and would need cardiology and PCP clearance.  Face-to-face time spent with the patient and care providers was 15 minutes, with more than 50% of the time spent counseling, educating, and coordinating care of the patient.      Howie IllJose Luis Shalimar Mcclain, MD [facility]

## 2019-03-14 ENCOUNTER — Other Ambulatory Visit: Payer: Self-pay

## 2019-03-14 ENCOUNTER — Encounter: Payer: Self-pay | Admitting: Surgery

## 2019-03-14 ENCOUNTER — Ambulatory Visit (INDEPENDENT_AMBULATORY_CARE_PROVIDER_SITE_OTHER): Payer: Medicare (Managed Care) | Admitting: Surgery

## 2019-03-14 VITALS — BP 107/65 | HR 57 | Temp 97.7°F | Resp 16 | Ht 68.0 in

## 2019-03-14 DIAGNOSIS — K810 Acute cholecystitis: Secondary | ICD-10-CM | POA: Diagnosis not present

## 2019-03-14 NOTE — Patient Instructions (Addendum)
Please call if you have questions or concerns.     Gallbladder Eating Plan If you have a gallbladder condition, you may have trouble digesting fats. Eating a low-fat diet can help reduce your symptoms, and may be helpful before and after having surgery to remove your gallbladder (cholecystectomy). Your health care provider may recommend that you work with a diet and nutrition specialist (dietitian) to help you reduce the amount of fat in your diet. What are tips for following this plan? General guidelines  Limit your fat intake to less than 30% of your total daily calories. If you eat around 1,800 calories each day, this is less than 60 grams (g) of fat per day.  Fat is an important part of a healthy diet. Eating a low-fat diet can make it hard to maintain a healthy body weight. Ask your dietitian how much fat, calories, and other nutrients you need each day.  Eat small, frequent meals throughout the day instead of three large meals.  Drink at least 8-10 cups of fluid a day. Drink enough fluid to keep your urine clear or pale yellow.  Limit alcohol intake to no more than 1 drink a day for nonpregnant women and 2 drinks a day for men. One drink equals 12 oz of beer, 5 oz of wine, or 1 oz of hard liquor. Reading food labels  Check Nutrition Facts on food labels for the amount of fat per serving. Choose foods with less than 3 grams of fat per serving. Shopping  Choose nonfat and low-fat healthy foods. Look for the words "nonfat," "low fat," or "fat free."  Avoid buying processed or prepackaged foods. Cooking  Cook using low-fat methods, such as baking, broiling, grilling, or boiling.  Cook with small amounts of healthy fats, such as olive oil, grapeseed oil, canola oil, or sunflower oil. What foods are recommended?   All fresh, frozen, or canned fruits and vegetables.  Whole grains.  Low-fat or non-fat (skim) milk and yogurt.  Lean meat, skinless poultry, fish, eggs, and beans.   Low-fat protein supplement powders or drinks.  Spices and herbs. What foods are not recommended?  High-fat foods. These include baked goods, fast food, fatty cuts of meat, ice cream, french toast, sweet rolls, pizza, cheese bread, foods covered with butter, creamy sauces, or cheese.  Fried foods. These include french fries, tempura, battered fish, breaded chicken, fried breads, and sweets.  Foods with strong odors.  Foods that cause bloating and gas. Summary  A low-fat diet can be helpful if you have a gallbladder condition, or before and after gallbladder surgery.  Limit your fat intake to less than 30% of your total daily calories. This is about 60 g of fat if you eat 1,800 calories each day.  Eat small, frequent meals throughout the day instead of three large meals. This information is not intended to replace advice given to you by your health care provider. Make sure you discuss any questions you have with your health care provider. Document Released: 06/27/2013 Document Revised: 10/13/2018 Document Reviewed: 07/30/2016 Elsevier Patient Education  2020 [employer].

## 2019-03-14 NOTE — Progress Notes (Signed)
03/14/2019  History of Present Illness: Darrell Hayes is a 78 y.o. male presenting for evaluation 1 month after removal of cholecystostomy tube.  He was last seen on 02/10/2019 at which time his drain was removed as it was malfunctioning.  Prior to removal, the patient had had a cholangiogram on 01/16/2019 which showed a widely patent cystic duct and common bile duct.  Since his last appointment with [place], patient reports that he has been doing well, with no nausea, no vomiting, no abdominal pain.  He is eating well.  He did fall last week as he tried to get out of his wheelchair onto bed without any assistance but otherwise he has been using a walker and his family has been around for helping.  Past Medical History: Past Medical History:  Diagnosis Date  . Agitation    and irritability, situational exacerbation  . Alveolar/parietoalveolar pneumonopathy, other    06/14/09 CT chest, Mild LLL GGO; 02/28/10 CT chest , improved  . Atrial fibrillation (HCC)    Coumadin therapy - Dr. Stanford Breed.  Cardioversion 08/23/09, Amiodarone Rx since 09/03/2009  . CAD (coronary artery disease) 09/2008   Dr. Stanford Breed, stent to LAD  . CVA (cerebral vascular accident) ([place]) 2007   In the setting of atrial fibrillation  . Dyspnea 09/2008   Class 3, since 2008.  Significant tachycardia.  No PE or fibrosis on CT chest 3/10, 12/10, 02/2010, normal PFT but isolated low DLCO  06/2009, normal hgb, TSH, creat and albumin , Dec 2010.  Resolved after A Fib cardioversion and Amiodarone Marh 2011  . Hepatitis   . Hiatal hernia   . History of chickenpox   . Hyperlipidemia   . Hypertension   . Nonspecific abnormal results of pulmonary system function study   . Pulmonary nodules 09/2008   Nonspecific reticulonodular nodules RLL superior segment, resolved on CT 06/14/09  . Seizures ([place]) 12/2008   Dr. Gaynell Face     Past Surgical History: Past Surgical History:  Procedure Laterality Date  . APPENDECTOMY    . IR PERC CHOLECYSTOSTOMY   12/23/2018  . Stent placed  09/2008    Home Medications: Prior to Admission medications   Medication Sig Start Date End Date Taking? Authorizing Provider  albuterol (PROVENTIL) (2.5 MG/3ML) 0.083% nebulizer solution Take 2.5 mg by nebulization every 4 (four) hours as needed for wheezing or shortness of breath.   Yes [provider]  amiodarone (PACERONE) 200 MG tablet Take 400 mg bid for week 1 then, Take 400 mg daily for week 2 then, Take 200 mg daily 12/26/18  Yes Fritzi Mandes, MD  cholecalciferol (VITAMIN D) 1000 UNITS tablet Take 1,000 Units by mouth daily.     Yes [provider]  collagenase (SANTYL) ointment Apply topically daily. 12/26/18  Yes Fritzi Mandes, MD  docusate sodium (COLACE) 100 MG capsule Take 200 mg by mouth daily.     Yes [provider]  HYDROcodone-acetaminophen (NORCO/VICODIN) 5-325 MG tablet Take 1 tablet by mouth every 6 (six) hours as needed for moderate pain. 12/26/18  Yes Fritzi Mandes, MD  levothyroxine (SYNTHROID) 50 MCG tablet Take 50 mcg by mouth every evening.   Yes [provider]  lisinopril (ZESTRIL) 2.5 MG tablet Take 2.5 mg by mouth at bedtime.   Yes [provider]  metoprolol tartrate (LOPRESSOR) 25 MG tablet Take 0.5 tablets (12.5 mg total) by mouth daily. 04/14/11  Yes Tonia Ghent, MD  rosuvastatin (CRESTOR) 20 MG tablet Take 1 tablet (20 mg total) by  mouth daily. Patient taking differently: Take 10 mg by mouth every evening.  04/14/11  Yes Joaquim Namuncan, Graham S, MD  sorbitol 70 % solution Take 2 tablespoonfuls every two hours as needed.    Yes [provider]  tamsulosin (FLOMAX) 0.4 MG CAPS capsule Take 0.8 mg by mouth at bedtime.   Yes [provider]  warfarin (COUMADIN) 1 MG tablet Take 2 mg Monday to Friday and Take 1 mg sat and sunday 12/26/18  Yes Enedina FinnerPatel, Sona, MD    Allergies: No Known Allergies  Review of Systems: Review of Systems  Constitutional: Negative for chills and fever.   Respiratory: Negative for shortness of breath.   Cardiovascular: Negative for chest pain.  Gastrointestinal: Negative for abdominal pain, nausea and vomiting.  Musculoskeletal: Positive for falls.    Physical Exam BP 107/65   Pulse (!) 57   Temp 97.7 F (36.5 C) (Temporal)   Resp 16   Ht 5\' 8"  (1.727 m)   SpO2 96%   BMI 25.85 kg/m  CONSTITUTIONAL: No acute distress HEENT:  Normocephalic, atraumatic, extraocular motion intact. RESPIRATORY:  Lungs are clear, and breath sounds are equal bilaterally. Normal respiratory effort without pathologic use of accessory muscles. CARDIOVASCULAR: Heart is regular without murmurs, gallops, or rubs. GI: The abdomen is soft, obese, nondistended, nontender to palpation.  Prior drain site in the right upper quadrant is well-healed.  NEUROLOGIC:  Motor and sensation is grossly normal.  Cranial nerves are grossly intact. PSYCH:  Alert and oriented to person, place and time. Affect is normal.  Labs/Imaging: None recently  Assessment and Plan: This is a 78 y.o. male with a history of acute cholecystitis status post percutaneous cholecystostomy tube placement, removed on 02/10/2019.  Discussed with the patient that at this point as he is having no symptoms and is tolerating a diet without complications, there is no acute need for any surgical intervention.  We can continue with watchful waiting at this time.  However given how he presented last time did discuss and reinforced with the patient and his family that if he were to start having any milder symptoms of right upper quadrant pain, nausea, vomiting that he should contact us right away as we may end up having to push for surgery instead.  I would rather know about this sooner rather than later so that we are not having to face another episode of cholecystitis with a terrible presentation as he had last time.  Patient agrees with this and he will keep us informed.  Face-to-face time spent with the patient  and care providers was 15 minutes, with more than 50% of the time spent counseling, educating, and coordinating care of the patient.     Howie IllJose Luis Lenzi Marmo, MD [facility]

## 2019-04-25 ENCOUNTER — Other Ambulatory Visit: Payer: Self-pay

## 2019-04-25 ENCOUNTER — Encounter: Payer: Medicare (Managed Care) | Attending: Physician Assistant | Admitting: Physician Assistant

## 2019-04-25 DIAGNOSIS — I480 Paroxysmal atrial fibrillation: Secondary | ICD-10-CM | POA: Diagnosis not present

## 2019-04-25 DIAGNOSIS — E11622 Type 2 diabetes mellitus with other skin ulcer: Secondary | ICD-10-CM | POA: Insufficient documentation

## 2019-04-25 DIAGNOSIS — I129 Hypertensive chronic kidney disease with stage 1 through stage 4 chronic kidney disease, or unspecified chronic kidney disease: Secondary | ICD-10-CM | POA: Diagnosis not present

## 2019-04-25 DIAGNOSIS — Z87891 Personal history of nicotine dependence: Secondary | ICD-10-CM | POA: Diagnosis not present

## 2019-04-25 DIAGNOSIS — I2510 Atherosclerotic heart disease of native coronary artery without angina pectoris: Secondary | ICD-10-CM | POA: Insufficient documentation

## 2019-04-25 DIAGNOSIS — N183 Chronic kidney disease, stage 3 unspecified: Secondary | ICD-10-CM | POA: Insufficient documentation

## 2019-04-25 DIAGNOSIS — B1910 Unspecified viral hepatitis B without hepatic coma: Secondary | ICD-10-CM | POA: Insufficient documentation

## 2019-04-25 DIAGNOSIS — E11621 Type 2 diabetes mellitus with foot ulcer: Secondary | ICD-10-CM | POA: Diagnosis not present

## 2019-04-25 DIAGNOSIS — L97812 Non-pressure chronic ulcer of other part of right lower leg with fat layer exposed: Secondary | ICD-10-CM | POA: Diagnosis not present

## 2019-04-25 DIAGNOSIS — Z7901 Long term (current) use of anticoagulants: Secondary | ICD-10-CM | POA: Diagnosis not present

## 2019-04-25 DIAGNOSIS — E1122 Type 2 diabetes mellitus with diabetic chronic kidney disease: Secondary | ICD-10-CM | POA: Insufficient documentation

## 2019-04-25 NOTE — Progress Notes (Signed)
Darrell Hayes, Darrell Hayes ([id number]) Visit Report for 04/25/2019 Allergy List Details Patient Name: Darrell Hayes, Darrell Hayes Date of Service: 04/25/2019 1:15 PM Medical Record Number: [id number] Patient Account Number: [id number] Date of Birth/Sex: January 13, 1941 (78 y.o. M) Treating RN: Arnette Norris Primary Care Lindsey Hommel: Hinda Lenis Other Clinician: Referring Atasha Colebank: Hinda Lenis Treating Deborah Lazcano/Extender: STONE III, HOYT Weeks in Treatment: 0 Allergies Active Allergies Tamiflu Severity: Moderate Allergy Notes Electronic Signature(s) Signed: 04/25/2019 4:21:49 PM By: Arnette Norris Entered By: Arnette Norris on 04/25/2019 13:27:48 Darrell Hayes ([id number]) -------------------------------------------------------------------------------- Arrival Information Details Patient Name: Darrell Hayes Date of Service: 04/25/2019 1:15 PM Medical Record Number: [id number] Patient Account Number: [id number] Date of Birth/Sex: 1940/08/15 (78 y.o. M) Treating RN: Curtis Sites Primary Care Hasset Chaviano: Hinda Lenis Other Clinician: Referring Chastin Riesgo: Hinda Lenis Treating Kenita Bines/Extender: Linwood Dibbles, HOYT Weeks in Treatment: 0 Visit Information Patient Arrived: Wheel Chair Arrival Time: 13:21 Accompanied By: daughter Transfer Assistance: None Patient Identification Verified: Yes Secondary Verification Process Completed: Yes Electronic Signature(s) Signed: 04/25/2019 2:42:16 PM By: Dayton Martes RCP, RRT, CHT Entered By: Dayton Martes on 04/25/2019 13:21:24 Darrell Hayes ([id number]) -------------------------------------------------------------------------------- Clinic Level of Care Assessment Details Patient Name: Darrell Hayes Date of Service: 04/25/2019 1:15 PM Medical Record Number: [id number] Patient Account Number: [id number] Date of Birth/Sex: 04-09-1941 (78 y.o. M) Treating RN: Curtis Sites Primary Care Winda Summerall: Hinda Lenis Other  Clinician: Referring Makhya Arave: Hinda Lenis Treating Aanchal Cope/Extender: Linwood Dibbles, HOYT Weeks in Treatment: 0 Clinic Level of Care Assessment Items TOOL 1 Quantity Score []  - Use when EandM and Procedure is performed on INITIAL visit 0 ASSESSMENTS - Nursing Assessment / Reassessment X - General Physical Exam (combine w/ comprehensive assessment (listed just below) when 1 20 performed on new pt. evals) X- 1 25 Comprehensive Assessment (HX, ROS, Risk Assessments, Wounds Hx, etc.) ASSESSMENTS - Wound and Skin Assessment / Reassessment []  - Dermatologic / Skin Assessment (not related to wound area) 0 ASSESSMENTS - Ostomy and/or Continence Assessment and Care []  - Incontinence Assessment and Management 0 []  - 0 Ostomy Care Assessment and Management (repouching, etc.) PROCESS - Coordination of Care X - Simple Patient / Family Education for ongoing care 1 15 []  - 0 Complex (extensive) Patient / Family Education for ongoing care X- 1 10 Staff obtains , Records, Test Results / Process Orders []  - 0 Staff telephones HHA, Nursing Homes / Clarify orders / etc []  - 0 Routine Transfer to another Facility (non-emergent condition) []  - 0 Routine Hospital Admission (non-emergent condition) X- 1 15 New Admissions / / Ordering NPWT, Apligraf, etc. []  - 0 Emergency Hospital Admission (emergent condition) PROCESS - Special Needs []  - Pediatric / Minor Patient Management 0 []  - 0 Isolation Patient Management []  - 0 Hearing / Language / Visual special needs []  - 0 Assessment of Community assistance (transportation, D/C planning, etc.) []  - 0 Additional assistance / Altered mentation []  - 0 Support Surface(s) Assessment (bed, cushion, seat, etc.) EMMAUS, BRANDI. ( ) INTERVENTIONS - Miscellaneous []  - External ear exam 0 []  - 0 Patient Transfer (multiple staff / / Similar devices) []  - 0 Simple Staple / Suture removal (25 or less) []   - 0 Complex Staple / Suture removal (26 or more) []  - 0 Hypo/Hyperglycemic Management (do not check if billed separately) X- 1 15 Ankle / Brachial Index (ABI) - do not check if billed separately Has the patient been seen at the hospital within the last three years: Yes Total Score: 100 Level Of  Care: New/Established - Level 3 Electronic Signature(s) Signed: 04/25/2019 4:44:40 PM By: Curtis Sites Entered By: Curtis Sites on 04/25/2019 14:22:46 Darrell Hayes ([id number]) -------------------------------------------------------------------------------- Encounter Discharge Information Details Patient Name: Darrell Hayes Date of Service: 04/25/2019 1:15 PM Medical Record Number: [id number] Patient Account Number: [id number] Date of Birth/Sex: 05-01-1941 (78 y.o. M) Treating RN: Curtis Sites Primary Care Mckaila Duffus: Hinda Lenis Other Clinician: Referring Corrion Stirewalt: Hinda Lenis Treating Tirsa Gail/Extender: Linwood Dibbles, HOYT Weeks in Treatment: 0 Encounter Discharge Information Items Post Procedure Vitals Discharge Condition: Stable Temperature (F): 98.2 Ambulatory Status: Wheelchair Pulse (bpm): 60 Discharge Destination: Home Respiratory Rate (breaths/min): 16 Transportation: Private Auto Blood Pressure (mmHg): 119/59 Accompanied By: daughter Schedule Follow-up Appointment: Yes Clinical Summary of Care: Electronic Signature(s) Signed: 04/25/2019 4:44:40 PM By: Curtis Sites Entered By: Curtis Sites on 04/25/2019 15:07:07 Darrell Hayes ([id number]) -------------------------------------------------------------------------------- Lower Extremity Assessment Details Patient Name: Darrell Hayes Date of Service: 04/25/2019 1:15 PM Medical Record Number: [id number] Patient Account Number: [id number] Date of Birth/Sex: 09-26-1940 (78 y.o. M) Treating RN: Huel Coventry Primary Care Gerod Caligiuri: Hinda Lenis Other Clinician: Referring Anari Evitt: Hinda Lenis Treating  Haevyn Ury/Extender: Linwood Dibbles, HOYT Weeks in Treatment: 0 Edema Assessment Assessed: [Left: No] [Right: No] [Left: Edema] [Right: :] Calf Left: Right: Point of Measurement: 31 cm From Medial Instep 40 cm 39.5 cm Ankle Left: Right: Point of Measurement: 12 cm From Medial Instep 22.4 cm 22 cm Vascular Assessment Pulses: Dorsalis Pedis Palpable: [Left:Yes] [Right:Yes] Doppler Audible: [Left:Yes] [Right:Yes] Posterior Tibial Palpable: [Left:Yes] [Right:Yes] Doppler Audible: [Left:Yes] [Right:Yes] Blood Pressure: Brachial: [Left:145] [Right:145] Dorsalis Pedis: 150 [Left:Dorsalis Pedis: 144] Ankle: Posterior Tibial: 120 [Left:Posterior Tibial: 118 1.03] [Right:0.99] Electronic Signature(s) Signed: 04/25/2019 1:44:38 PM By: Elliot Gurney, BSN, RN, CWS, Kim RN, BSN Entered By: Elliot Gurney, BSN, RN, CWS, Kim on 04/25/2019 13:44:38 Darrell Hayes ([id number]) -------------------------------------------------------------------------------- Multi Wound Chart Details Patient Name: Darrell Hayes Date of Service: 04/25/2019 1:15 PM Medical Record Number: [id number] Patient Account Number: [id number] Date of Birth/Sex: 1940-11-24 (78 y.o. M) Treating RN: Curtis Sites Primary Care Jema Deegan: Hinda Lenis Other Clinician: Referring Bryne Lindon: Hinda Lenis Treating Aizlynn Digilio/Extender: STONE III, HOYT Weeks in Treatment: 0 Vital Signs Height(in): Pulse(bpm): 60 Weight(lbs): Blood Pressure(mmHg): 119/59 Body Mass Index(BMI): Temperature(F): 98.2 Respiratory Rate 16 (breaths/min): Photos: [N/A:N/A] Wound Location: Right Knee - Medial N/A N/A Wounding Event: Trauma N/A N/A Primary Etiology: Diabetic Wound/Ulcer of the N/A N/A Lower Extremity Comorbid History: Arrhythmia, Coronary Artery N/A N/A Disease, Hypertension, Hepatitis B, Type II Diabetes Date Acquired: 12/05/2018 N/A N/A Weeks of Treatment: 0 N/A N/A Wound Status: Open N/A N/A Measurements L x W x D 1x1.4x0.1 N/A  N/A (cm) Area (cm) : 1.1 N/A N/A Volume (cm) : 0.11 N/A N/A % Reduction in Area: 0.00% N/A N/A % Reduction in Volume: 0.00% N/A N/A Classification: Grade 1 N/A N/A Exudate Amount: Small N/A N/A Exudate Type: Serosanguineous N/A N/A Exudate Color: red, brown N/A N/A Wound Margin: Flat and Intact N/A N/A Granulation Amount: Medium (34-66%) N/A N/A Granulation Quality: Red N/A N/A Necrotic Amount: Small (1-33%) N/A N/A Exposed Structures: Fat Layer (Subcutaneous N/A N/A Tissue) Exposed: Yes Fascia: No Tendon: No Muscle: No ESLEY, BROOKING. ([id number]) Joint: No Bone: No Epithelialization: None N/A N/A Debridement: Debridement - Excisional N/A N/A Pre-procedure 14:16 N/A N/A Verification/Time Out Taken: Pain Control: Lidocaine 4% Topical Solution N/A N/A Tissue Debrided: Subcutaneous N/A N/A Level: Skin/Subcutaneous Tissue N/A N/A Debridement Area (sq cm): 1.4 N/A N/A Instrument: Curette N/A N/A Bleeding: Minimum N/A N/A Hemostasis Achieved: Pressure N/A N/A Procedural  Pain: 0 N/A N/A Post Procedural Pain: 0 N/A N/A Debridement Treatment Procedure was tolerated well N/A N/A Response: Post Debridement 1x1.4x0.2 N/A N/A Measurements L x W x D (cm) Post Debridement Volume: 0.22 N/A N/A (cm) Procedures Performed: Debridement N/A N/A Treatment Notes Electronic Signature(s) Signed: 04/25/2019 2:32:22 PM By: Worthy Keeler PA-C Entered By: Worthy Keeler on 04/25/2019 14:32:21 Darrell Hayes ([id number]) -------------------------------------------------------------------------------- [place] Details Patient Name: Darrell Hayes Date of Service: 04/25/2019 1:15 PM Medical Record Number: [id number] Patient Account Number: [id number] Date of Birth/Sex: 1941-05-06 (78 y.o. M) Treating RN: Montey Hora Primary Care Nargis Abrams: Durenda Guthrie Other Clinician: Referring Aziah Brostrom: Durenda Guthrie Treating Chaylee Ehrsam/Extender: Melburn Hake, HOYT Weeks in  Treatment: 0 Active Inactive Abuse / Safety / Falls / Self Care Management Nursing Diagnoses: Potential for falls Goals: Patient will remain injury free related to falls Date Initiated: 04/25/2019 Target Resolution Date: 07/15/2019 Goal Status: Active Interventions: Assess fall risk on admission and as needed Notes: Orientation to the Wound Care Program Nursing Diagnoses: Knowledge deficit related to the wound healing center program Goals: Patient/caregiver will verbalize understanding of the [facility] Program Date Initiated: 04/25/2019 Target Resolution Date: 07/15/2019 Goal Status: Active Interventions: Provide education on orientation to the wound center Notes: Pressure Nursing Diagnoses: Potential for impaired tissue integrity related to pressure, friction, moisture, and shear Goals: Patient will remain free of pressure ulcers Date Initiated: 04/25/2019 Target Resolution Date: 07/15/2019 Goal Status: Active Interventions: Assess: immobility, friction, shearing, incontinence upon admission and as needed KMARION, RAWL ([id number]) Assess potential for pressure ulcer upon admission and as needed Notes: Wound/Skin Impairment Nursing Diagnoses: Impaired tissue integrity Goals: Ulcer/skin breakdown will heal within 14 weeks Date Initiated: 04/25/2019 Target Resolution Date: 07/15/2019 Goal Status: Active Interventions: Assess patient/caregiver ability to obtain necessary supplies Assess patient/caregiver ability to perform ulcer/skin care regimen upon admission and as needed Assess ulceration(s) every visit Notes: Electronic Signature(s) Signed: 04/25/2019 4:44:40 PM By: Montey Hora Entered By: Montey Hora on 04/25/2019 14:15:50 Darrell Hayes ([id number]) -------------------------------------------------------------------------------- Pain Assessment Details Patient Name: Darrell Hayes Date of Service: 04/25/2019 1:15 PM Medical Record Number:  [id number] Patient Account Number: [id number] Date of Birth/Sex: [birth year] (78 y.o. M) Treating RN: Montey Hora Primary Care Khanh Tanori: Durenda Guthrie Other Clinician: Referring Annalyse Langlais: Durenda Guthrie Treating Carmela Piechowski/Extender: Melburn Hake, HOYT Weeks in Treatment: 0 Active Problems Location of Pain Severity and Description of Pain Patient Has Paino No Site Locations Pain Management and Medication Current Pain Management: Electronic Signature(s) Signed: 04/25/2019 2:42:16 PM By: Paulla Fore, RRT, CHT Signed: 04/25/2019 4:44:40 PM By: Montey Hora Entered By: Lorine Bears on 04/25/2019 13:21:41 Darrell Hayes ([id number]) -------------------------------------------------------------------------------- Patient/Caregiver Education Details Patient Name: Darrell Hayes Date of Service: 04/25/2019 1:15 PM Medical Record Number: [id number] Patient Account Number: [id number] Date of Birth/Gender: [birth year] (78 y.o. M) Treating RN: Montey Hora Primary Care Physician: Durenda Guthrie Other Clinician: Referring Physician: Durenda Guthrie Treating Physician/Extender: Sharalyn Ink in Treatment: 0 Education Assessment Education Provided To: Patient and Caregiver Education Topics Provided Wound/Skin Impairment: Handouts: Other: wound care as ordered Methods: Demonstration, Explain/Verbal Responses: State content correctly Electronic Signature(s) Signed: 04/25/2019 4:44:40 PM By: Montey Hora Entered By: Montey Hora on 04/25/2019 14:23:07 Darrell Hayes ([id number]) -------------------------------------------------------------------------------- Wound Assessment Details Patient Name: Darrell Hayes Date of Service: 04/25/2019 1:15 PM Medical Record Number: [id number] Patient Account Number: [id number] Date of Birth/Sex: 11-Mar-1941 (78 y.o. M) Treating RN: Harold Barban Primary Care Shaunna Rosetti: Durenda Guthrie Other  Clinician: Referring  Jeffrie Lofstrom: Hinda LenisEILLY, SHARON Treating Francena Zender/Extender: STONE III, HOYT Weeks in Treatment: 0 Wound Status Wound Number: 1 Primary Diabetic Wound/Ulcer of the Lower Extremity Etiology: Wound Location: Right Knee - Medial Wound Open Wounding Event: Trauma Status: Date Acquired: 12/05/2018 Comorbid Arrhythmia, Coronary Artery Disease, Weeks Of Treatment: 0 History: Hypertension, Hepatitis B, Type II Diabetes Clustered Wound: No Photos Wound Measurements Length: (cm) 1 % Reduction in Width: (cm) 1.4 % Reduction in Depth: (cm) 0.1 Epithelializat Area: (cm) 1.1 Tunneling: Volume: (cm) 0.11 Undermining: Area: 0% Volume: 0% ion: None No No Wound Description Classification: Grade 1 Foul Odor Aft Wound Margin: Flat and Intact Slough/Fibrin Exudate Amount: Small Exudate Type: Serosanguineous Exudate Color: red, brown er Cleansing: No o Yes Wound Bed Granulation Amount: Medium (34-66%) Exposed Structure Granulation Quality: Red Fascia Exposed: No Necrotic Amount: Small (1-33%) Fat Layer (Subcutaneous Tissue) Exposed: Yes Necrotic Quality: Adherent Slough Tendon Exposed: No Muscle Exposed: No Joint Exposed: No Bone Exposed: No Treatment Notes Darrell Hayes, Darrell C. ([id number]) Wound #1 (Right, Medial Knee) Notes hydrofera blue, BFD Electronic Signature(s) Signed: 04/25/2019 2:32:10 PM By: Lenda KelpStone III, Hoyt PA-C Signed: 04/25/2019 4:21:49 PM By: Arnette NorrisBiell, Kristina Entered By: Lenda KelpStone III, Hoyt on 04/25/2019 14:32:10 Darrell Hayes, Darrell C. ([id number]) -------------------------------------------------------------------------------- Vitals Details Patient Name: Darrell Hayes, Darrell C. Date of Service: 04/25/2019 1:15 PM Medical Record Number: [id number] Patient Account Number: [id number] Date of Birth/Sex: 1941/06/03 (78 y.o. M) Treating RN: Curtis Sitesorthy, Joanna Primary Care Schawn Byas: Hinda LenisEILLY, SHARON Other Clinician: Referring Reign Bartnick: Hinda LenisEILLY, SHARON Treating Magdalyn Arenivas/Extender:  STONE III, HOYT Weeks in Treatment: 0 Vital Signs Time Taken: 13:20 Temperature (F): 98.2 Pulse (bpm): 60 Respiratory Rate (breaths/min): 16 Blood Pressure (mmHg): 119/59 Reference Range: 80 - 120 mg / dl Electronic Signature(s) Signed: 04/25/2019 2:42:16 PM By: Dayton MartesWallace, RCP,RRT,CHT, Sallie RCP, RRT, CHT Entered By: Dayton MartesWallace, RCP,RRT,CHT, Sallie on 04/25/2019 13:22:40

## 2019-04-25 NOTE — Progress Notes (Signed)
Darrell Hayes, Darrell Hayes ([id number]) Visit Report for 04/25/2019 Abuse/Suicide Risk Screen Details Patient Name: Darrell Hayes, Darrell Hayes Date of Service: 04/25/2019 1:15 PM Medical Record Number: [id number] Patient Account Number: [id number] Date of Birth/Sex: Mar 08, 1941 (78 y.o. M) Treating RN: Harold Barban Primary Care Chaela Branscum: Durenda Guthrie Other Clinician: Referring Nakea Gouger: Durenda Guthrie Treating Khalik Pewitt/Extender: STONE III, HOYT Weeks in Treatment: 0 Abuse/Suicide Risk Screen Items Answer ABUSE RISK SCREEN: Has anyone close to you tried to hurt or harm you recentlyo No Do you feel uncomfortable with anyone in your familyo No Has anyone forced you do things that you didnot want to doo No Electronic Signature(s) Signed: 04/25/2019 4:21:49 PM By: Harold Barban Entered By: Harold Barban on 04/25/2019 13:37:20 Darrell Hayes ([id number]) -------------------------------------------------------------------------------- Activities of Daily Living Details Patient Name: Darrell Hayes Date of Service: 04/25/2019 1:15 PM Medical Record Number: [id number] Patient Account Number: [id number] Date of Birth/Sex: May 28, 1941 (78 y.o. M) Treating RN: Harold Barban Primary Care Tavian Callander: Durenda Guthrie Other Clinician: Referring Allante Beane: Durenda Guthrie Treating Imagene Boss/Extender: Melburn Hake, HOYT Weeks in Treatment: 0 Activities of Daily Living Items Answer Activities of Daily Living (Please select one for each item) Drive Automobile Not Able Take Medications Need Assistance Use Telephone Need Assistance Care for Appearance Need Assistance Use Toilet Need Assistance Bath / Shower Need Assistance Dress Self Need Assistance Feed Self Completely Able Walk Need Assistance Get In / Out Bed Need Assistance Housework Not Able Prepare Meals Not Able Handle Money Need Assistance Shop for Self Not Able Electronic Signature(s) Signed: 04/25/2019 4:21:49 PM By: Harold Barban Entered By:  Harold Barban on 04/25/2019 13:38:10 Darrell Hayes ([id number]) -------------------------------------------------------------------------------- Education Screening Details Patient Name: Darrell Hayes Date of Service: 04/25/2019 1:15 PM Medical Record Number: [id number] Patient Account Number: [id number] Date of Birth/Sex: [birth year] (78 y.o. M) Treating RN: Harold Barban Primary Care Audriella Blakeley: Durenda Guthrie Other Clinician: Referring Jeanann Balinski: Durenda Guthrie Treating Breely Panik/Extender: Melburn Hake, HOYT Weeks in Treatment: 0 Primary Learner Assessed: Patient Learning Preferences/Education Level/Primary Language Learning Preference: Explanation Highest Education Level: High School Preferred Language: English Cognitive Barrier Language Barrier: No Translator Needed: No Memory Deficit: No Emotional Barrier: No Cultural/Religious Beliefs Affecting Medical Care: No Physical Barrier Impaired Vision: No Impaired Hearing: No Decreased Hand dexterity: No Knowledge/Comprehension Knowledge Level: High Comprehension Level: High Ability to understand written High instructions: Ability to understand verbal High instructions: Motivation Anxiety Level: Calm Cooperation: Cooperative Education Importance: Acknowledges Need Interest in Health Problems: Asks Questions Perception: Coherent Willingness to Engage in Self- High Management Activities: Readiness to Engage in Self- High Management Activities: Electronic Signature(s) Signed: 04/25/2019 4:21:49 PM By: Harold Barban Entered By: Harold Barban on 04/25/2019 13:38:37 Darrell Hayes ([id number]) -------------------------------------------------------------------------------- Fall Risk Assessment Details Patient Name: Darrell Hayes Date of Service: 04/25/2019 1:15 PM Medical Record Number: [id number] Patient Account Number: [id number] Date of Birth/Sex: 08-20-1940 (78 y.o. M) Treating RN: Harold Barban Primary  Care Teighan Aubert: Durenda Guthrie Other Clinician: Referring Adore Kithcart: Durenda Guthrie Treating Magdala Brahmbhatt/Extender: Melburn Hake, HOYT Weeks in Treatment: 0 Fall Risk Assessment Items Have you had 2 or more falls in the last 12 monthso 0 Yes Have you had any fall that resulted in injury in the last 12 monthso 0 No FALLS RISK SCREEN History of falling - immediate or within 3 months 0 No Secondary diagnosis (Do you have 2 or more medical diagnoseso) 0 No Ambulatory aid None/bed rest/wheelchair/nurse 0 No Crutches/cane/walker 15 Yes Furniture 30 Yes Intravenous therapy Access/Saline/Heparin Lock 0 No Gait/Transferring Normal/ bed rest/ wheelchair 0 No Weak (short  steps with or without shuffle, stooped but able to lift head while 0 No walking, may seek support from furniture) Impaired (short steps with shuffle, may have difficulty arising from chair, head 20 Yes down, impaired balance) Mental Status Oriented to own ability 0 No Electronic Signature(s) Signed: 04/25/2019 4:21:49 PM By: Arnette Norris Entered By: Arnette Norris on 04/25/2019 13:39:57 Darrell Hayes ([id number]) -------------------------------------------------------------------------------- Foot Assessment Details Patient Name: Darrell Hayes Date of Service: 04/25/2019 1:15 PM Medical Record Number: [id number] Patient Account Number: [id number] Date of Birth/Sex: Mar 12, 1941 (78 y.o. M) Treating RN: Arnette Norris Primary Care Seward Coran: Hinda Lenis Other Clinician: Referring Jeramie Scogin: Hinda Lenis Treating Krystyne Tewksbury/Extender: Linwood Dibbles, HOYT Weeks in Treatment: 0 Foot Assessment Items Site Locations + = Sensation present, - = Sensation absent, C = Callus, U = Ulcer R = Redness, W = Warmth, M = Maceration, PU = Pre-ulcerative lesion F = Fissure, S = Swelling, D = Dryness Assessment Right: Left: Other Deformity: No No Prior Foot Ulcer: No No Prior Amputation: No No Charcot Joint: No No Ambulatory Status:  Ambulatory With Help Assistance Device: Walker Gait: Surveyor, mining) Signed: 04/25/2019 4:21:49 PM By: Arnette Norris Entered By: Arnette Norris on 04/25/2019 13:42:05 Darrell Hayes ([id number]) -------------------------------------------------------------------------------- Nutrition Risk Screening Details Patient Name: Darrell Hayes Date of Service: 04/25/2019 1:15 PM Medical Record Number: [id number] Patient Account Number: [id number] Date of Birth/Sex: 30-Oct-1940 (78 y.o. M) Treating RN: Arnette Norris Primary Care Linnette Panella: Hinda Lenis Other Clinician: Referring Davied Nocito: Hinda Lenis Treating Andrika Peraza/Extender: STONE III, HOYT Weeks in Treatment: 0 Height (in): Weight (lbs): Body Mass Index (BMI): Nutrition Risk Screening Items Score Screening NUTRITION RISK SCREEN: I have an illness or condition that made me change the kind and/or amount of 0 No food I eat I eat fewer than two meals per day 0 No I eat few fruits and vegetables, or milk products 0 No I have three or more drinks of beer, liquor or wine almost every day 0 No I have tooth or mouth problems that make it hard for me to eat 0 No I don't always have enough money to buy the food I need 0 No I eat alone most of the time 0 No I take three or more different prescribed or over-the-counter drugs a day 1 Yes Without wanting to, I have lost or gained 10 pounds in the last six months 0 No I am not always physically able to shop, cook and/or feed myself 0 No Nutrition Protocols Good Risk Protocol Moderate Risk Protocol High Risk Proctocol Risk Level: Good Risk Score: 1 Electronic Signature(s) Signed: 04/25/2019 4:21:49 PM By: Arnette Norris Entered By: Arnette Norris on 04/25/2019 13:40:29

## 2019-04-26 NOTE — Progress Notes (Signed)
YUVIN, BUSSIERE ([id number]) Visit Report for 04/25/2019 Chief Complaint Document Details Patient Name: Darrell Hayes, Darrell Hayes Date of Service: 04/25/2019 1:15 PM Medical Record Number: [id number] Patient Account Number: [id number] Date of Birth/Sex: December 31, 1940 (78 y.o. M) Treating RN: Curtis Sites Primary Care Provider: Hinda Lenis Other Clinician: Referring Provider: Hinda Lenis Treating Provider/Extender: Linwood Dibbles, HOYT Weeks in Treatment: 0 Information Obtained from: Patient Chief Complaint Right knee ulcer Electronic Signature(s) Signed: 04/25/2019 1:52:39 PM By: Lenda Kelp PA-C Entered By: Lenda Kelp on 04/25/2019 13:52:39 Darrell Hayes ([id number]) -------------------------------------------------------------------------------- Debridement Details Patient Name: Darrell Hayes Date of Service: 04/25/2019 1:15 PM Medical Record Number: [id number] Patient Account Number: [id number] Date of Birth/Sex: [birth year] (78 y.o. M) Treating RN: Curtis Sites Primary Care Provider: Hinda Lenis Other Clinician: Referring Provider: Hinda Lenis Treating Provider/Extender: Linwood Dibbles, HOYT Weeks in Treatment: 0 Debridement Performed for Wound #1 Right,Medial Knee Assessment: Performed By: Physician STONE III, HOYT E., PA-C Debridement Type: Debridement Severity of Tissue Pre Fat layer exposed Debridement: Level of Consciousness (Pre- Awake and Alert procedure): Pre-procedure Verification/Time Yes - 14:16 Out Taken: Start Time: 14:16 Pain Control: Lidocaine 4% Topical Solution Total Area Debrided (L x W): 1 (cm) x 1.4 (cm) = 1.4 (cm) Tissue and other material Viable, Non-Viable, Subcutaneous, Hyper-granulation debrided: Level: Skin/Subcutaneous Tissue Debridement Description: Excisional Instrument: Curette Bleeding: Minimum Hemostasis Achieved: Pressure End Time: 14:18 Procedural Pain: 0 Post Procedural Pain: 0 Response to Treatment: Procedure was  tolerated well Level of Consciousness Awake and Alert (Post-procedure): Post Debridement Measurements of Total Wound Length: (cm) 1 Width: (cm) 1.4 Depth: (cm) 0.2 Volume: (cm) 0.22 Character of Wound/Ulcer Post Debridement: Improved Severity of Tissue Post Debridement: Fat layer exposed Post Procedure Diagnosis Same as Pre-procedure Electronic Signature(s) Signed: 04/25/2019 4:44:40 PM By: Curtis Sites Signed: 04/25/2019 7:13:08 PM By: Lenda Kelp PA-C Entered By: Curtis Sites on 04/25/2019 14:18:48 Darrell Hayes ([id number]) -------------------------------------------------------------------------------- HPI Details Patient Name: Darrell Hayes Date of Service: 04/25/2019 1:15 PM Medical Record Number: [id number] Patient Account Number: [id number] Date of Birth/Sex: [birth year] (78 y.o. M) Treating RN: Curtis Sites Primary Care Provider: Hinda Lenis Other Clinician: Referring Provider: Hinda Lenis Treating Provider/Extender: Linwood Dibbles, HOYT Weeks in Treatment: 0 History of Present Illness HPI Description: 04/25/2019 on evaluation today patient presents for initial inspection here in our clinic concerning issues that he has been having with his right knee he sustained a fall in June 2020 when he fell in the bedroom. 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 Subsequently he is weak and does tend to fall secondary to this. Fortunately there is no signs of active infection at this time which is good news they have been using initially Medihoney for the treatment and then subsequently transition to Xeroform. I believe the biggest issue right now may be that despite the fact they have been using some silver nitrate on the area it has not been enough to completely pare down and get the hyper granulation tissue under control especially when moisture adding dressing such as Medihoney or Xeroform gauze were added over top of this. Nonetheless the  patient is on Coumadin therefore he is likely to bleed we got to be cognizant of this nonetheless he really does need some debridement to clear away the hyper granular tissue to allow this to heal more appropriately. No fevers, chills, nausea, vomiting, or diarrhea. The patient does have a history of hypertension, paroxysmal atrial fibrillation for which she is on long-term anticoagulant therapy,  and diabetes mellitus type 2 although he tells me even though this is in his chart that he is considered "prediabetic" I do not believe he is on any medications at this point for diabetes. Electronic Signature(s) Signed: 04/25/2019 2:26:25 PM By: Lenda Kelp PA-C Entered By: Lenda Kelp on 04/25/2019 14:26:24 Darrell Hayes ([id number]) -------------------------------------------------------------------------------- Physical Exam Details Patient Name: Darrell Hayes Date of Service: 04/25/2019 1:15 PM Medical Record Number: [id number] Patient Account Number: [id number] Date of Birth/Sex: 01-08-1941 (78 y.o. M) Treating RN: Curtis Sites Primary Care Provider: Hinda Lenis Other Clinician: Referring Provider: Hinda Lenis Treating Provider/Extender: STONE III, HOYT Weeks in Treatment: 0 Constitutional sitting or standing blood pressure is within target range for patient.. pulse regular and within target range for patient.Marland Kitchen respirations regular, non-labored and within target range for patient.Marland Kitchen temperature within target range for patient.. Well- nourished and well-hydrated in no acute distress. Eyes conjunctiva clear no eyelid edema noted. pupils equal round and reactive to light and accommodation. Ears, Nose, Mouth, and Throat no gross abnormality of ear auricles or external auditory canals. patient has hearing loss. mucus membranes moist. Respiratory normal breathing without difficulty. clear to auscultation bilaterally. Cardiovascular regular rate and rhythm with normal S1, S2.  2+ dorsalis pedis/posterior tibialis pulses. no clubbing, cyanosis, significant edema, <3 sec cap refill. Gastrointestinal (GI) soft, non-tender, non-distended, +BS. no ventral hernia noted. Musculoskeletal normal gait and posture. no significant deformity or arthritic changes, no loss or range of motion, no clubbing. Notes Patient's wound bed currently did show evidence of hyper granulation which did require sharp debridement to pare down the hyper granular tissue today down to good subcutaneous wound bed. He tolerated this debridement today without complication and post debridement the wound bed appears to be doing excellent. Patient has excellent blood flow seemingly into his lower extremities with good arterial ABIs noted to be normal today. He has no significant lower extremity edema at this time which is also good news. Electronic Signature(s) Signed: 04/25/2019 2:27:12 PM By: Lenda Kelp PA-C Entered By: Lenda Kelp on 04/25/2019 14:27:11 Darrell Hayes ([id number]) -------------------------------------------------------------------------------- Physician Orders Details Patient Name: Darrell Hayes Date of Service: 04/25/2019 1:15 PM Medical Record Number: [id number] Patient Account Number: [id number] Date of Birth/Sex: [birth year] (78 y.o. M) Treating RN: Curtis Sites Primary Care Provider: Hinda Lenis Other Clinician: Referring Provider: Hinda Lenis Treating Provider/Extender: Linwood Dibbles, HOYT Weeks in Treatment: 0 Verbal / Phone Orders: No Diagnosis Coding ICD-10 Coding Code Description E11.622 Type 2 diabetes mellitus with other skin ulcer L97.812 Non-pressure chronic ulcer of other part of right lower leg with fat layer exposed I10 Essential (primary) hypertension I48.0 Paroxysmal atrial fibrillation Z79.01 Long term (current) use of anticoagulants Wound Cleansing Wound #1 Right,Medial Knee o Dial antibacterial soap, wash wounds, rinse and pat dry  prior to dressing wounds o May Shower, gently pat wound dry prior to applying new dressing. Primary Wound Dressing Wound #1 Right,Medial Knee o Hydrafera Blue Ready Transfer Secondary Dressing Wound #1 Right,Medial Knee o Boardered Foam Dressing Dressing Change Frequency Wound #1 Right,Medial Knee o Change dressing every other day. Follow-up Appointments Wound #1 Right,Medial Knee o Return Appointment in 2 weeks. Home Health Wound #1 Right,Medial Knee o Continue Home [facility] Visits - [facility] o Home Health Nurse may visit PRN to address patientos wound care needs. o FACE TO FACE ENCOUNTER: MEDICARE and MEDICAID PATIENTS: I certify that this patient is under my care and that I had a face-to-face encounter that meets the  physician face-to-face encounter requirements with this patient on this date. The encounter with the patient was in whole or in part for the following MEDICAL CONDITION: (primary reason for Home Healthcare) MEDICAL NECESSITY: I certify, that based on my findings, NURSING services are a medically necessary home health service. HOME BOUND STATUS: I certify that my clinical findings support that this patient is homebound (i.e., Due to illness or injury, pt requires aid of supportive devices such as crutches, cane, wheelchairs, walkers, the use of special transportation or the assistance of another person to leave their place of residence. There is a normal inability to leave the home Guttenberg, Jasdeep C. ([id number]) and doing so requires considerable and taxing effort. Other absences are for medical reasons / religious services and are infrequent or of short duration when for other reasons). o If current dressing causes regression in wound condition, may D/C ordered dressing product/s and apply Normal Saline Moist Dressing daily until next [facility] / Other MD appointment. Notify [facility] of regression in wound condition at  [phone]. o Please direct any NON-WOUND related issues/requests for orders to patient's Primary Care Physician Electronic Signature(s) Signed: 04/25/2019 4:44:40 PM By: Curtis Sites Signed: 04/25/2019 7:13:08 PM By: Lenda Kelp PA-C Entered By: Curtis Sites on 04/25/2019 14:22:18 Darrell Hayes ([id number]) -------------------------------------------------------------------------------- Problem List Details Patient Name: Darrell Hayes Date of Service: 04/25/2019 1:15 PM Medical Record Number: [id number] Patient Account Number: [id number] Date of Birth/Sex: 12/28/1940 (78 y.o. M) Treating RN: Curtis Sites Primary Care Provider: Hinda Lenis Other Clinician: Referring Provider: Hinda Lenis Treating Provider/Extender: Linwood Dibbles, HOYT Weeks in Treatment: 0 Active Problems ICD-10 Evaluated Encounter Code Description Active Date Today Diagnosis E11.622 Type 2 diabetes mellitus with other skin ulcer 04/25/2019 No Yes L97.812 Non-pressure chronic ulcer of other part of right lower leg 04/25/2019 No Yes with fat layer exposed I10 Essential (primary) hypertension 04/25/2019 No Yes I48.0 Paroxysmal atrial fibrillation 04/25/2019 No Yes Z79.01 Long term (current) use of anticoagulants 04/25/2019 No Yes Inactive Problems Resolved Problems Electronic Signature(s) Signed: 04/25/2019 1:52:17 PM By: Lenda Kelp PA-C Entered By: Lenda Kelp on 04/25/2019 13:52:16 Darrell Hayes ([id number]) -------------------------------------------------------------------------------- Progress Note Details Patient Name: Darrell Hayes Date of Service: 04/25/2019 1:15 PM Medical Record Number: [id number] Patient Account Number: [id number] Date of Birth/Sex: [birth year] (78 y.o. M) Treating RN: Curtis Sites Primary Care Provider: Hinda Lenis Other Clinician: Referring Provider: Hinda Lenis Treating Provider/Extender: Linwood Dibbles, HOYT Weeks in Treatment:  0 Subjective Chief Complaint Information obtained from Patient Right knee ulcer History of Present Illness (HPI) 04/25/2019 on evaluation today patient presents for initial inspection here in our clinic concerning issues that he has been having with his right knee he sustained a fall in June 2020 when he fell in the bedroom. 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 Subsequently he is weak and does tend to fall secondary to this. Fortunately there is no signs of active infection at this time which is good news they have been using initially Medihoney for the treatment and then subsequently transition to Xeroform. I believe the biggest issue right now may be that despite the fact they have been using some silver nitrate on the area it has not been enough to completely pare down and get the hyper granulation tissue under control especially when moisture adding dressing such as Medihoney or Xeroform gauze were added over top of this. Nonetheless the patient is on Coumadin therefore he is likely to  bleed we got to be cognizant of this nonetheless he really does need some debridement to clear away the hyper granular tissue to allow this to heal more appropriately. No fevers, chills, nausea, vomiting, or diarrhea. The patient does have a history of hypertension, paroxysmal atrial fibrillation for which she is on long- term anticoagulant therapy, and diabetes mellitus type 2 although he tells me even though this is in his chart that he is considered "prediabetic" I do not believe he is on any medications at this point for diabetes. Patient History Information obtained from Patient. Allergies Tamiflu (Severity: Moderate) Family History Heart Disease - Mother, Stroke - Mother, No family history of Cancer, Diabetes, Hereditary Spherocytosis, Hypertension, Kidney Disease, Lung Disease, Seizures, Thyroid Problems, Tuberculosis. Social History Former smoker - ended on 07/06/2006, Marital  Status - Divorced, Alcohol Use - Never, Drug Use - No History, Caffeine Use - Daily - coffee tea. Medical History Eyes Denies history of Cataracts, Glaucoma, Optic Neuritis Ear/Nose/Mouth/Throat Denies history of Chronic sinus problems/congestion, Middle ear problems Hematologic/Lymphatic Denies history of Anemia, Hemophilia, Human Immunodeficiency Virus, Lymphedema, Sickle Cell Disease Respiratory Denies history of Aspiration, Asthma, Chronic Obstructive Pulmonary Disease (COPD), Pneumothorax, Sleep Apnea, Tuberculosis Cardiovascular THEODORE, VIRGIN ([id number]) Patient has history of Arrhythmia - Afib, Coronary Artery Disease, Hypertension Denies history of Congestive Heart Failure, Deep Vein Thrombosis, Hypotension, Myocardial Infarction, Peripheral Arterial Disease, Peripheral Venous Disease, Phlebitis, Vasculitis Gastrointestinal Patient has history of Hepatitis B Denies history of Cirrhosis , Colitis, Crohn s, Hepatitis A, Hepatitis C Endocrine Patient has history of Type II Diabetes Denies history of Type I Diabetes Genitourinary Denies history of End Stage Renal Disease Immunological Denies history of Lupus Erythematosus, Raynaud s, Scleroderma Integumentary (Skin) Denies history of History of Burn, History of pressure wounds Musculoskeletal Denies history of Gout, Rheumatoid Arthritis, Osteoarthritis, Osteomyelitis Neurologic Denies history of Dementia, Neuropathy, Quadriplegia, Paraplegia, Seizure Disorder Oncologic Denies history of Received Chemotherapy, Received Radiation Psychiatric Denies history of Anorexia/bulimia, Confinement Anxiety Patient is treated with Controlled Diet. Blood sugar is not tested. Review of Systems (ROS) Constitutional Symptoms (General Health) Denies complaints or symptoms of Fatigue, Fever, Chills, Marked Weight Change. Eyes Complains or has symptoms of Glasses / Contacts. Ear/Nose/Mouth/Throat Denies complaints or symptoms of  Difficult clearing ears, Sinusitis. Hematologic/Lymphatic Denies complaints or symptoms of Bleeding / Clotting Disorders, Human Immunodeficiency Virus. Respiratory Denies complaints or symptoms of Chronic or frequent coughs, Shortness of Breath. Cardiovascular Denies complaints or symptoms of Chest pain, LE edema. Gastrointestinal Denies complaints or symptoms of Frequent diarrhea, Nausea, Vomiting. Endocrine Denies complaints or symptoms of Hepatitis, Thyroid disease, Polydypsia (Excessive Thirst). Genitourinary Denies complaints or symptoms of Kidney failure/ Dialysis, Incontinence/dribbling, CKD Stage 3 Immunological Denies complaints or symptoms of Hives, Itching. Integumentary (Skin) Complains or has symptoms of Wounds. Denies complaints or symptoms of Bleeding or bruising tendency, Breakdown, Swelling. Musculoskeletal Denies complaints or symptoms of Muscle Pain, Muscle Weakness. Neurologic Complains or has symptoms of Numbness/parasthesias, Focal/Weakness, CVA 2008 Right Psychiatric Denies complaints or symptoms of Anxiety, Claustrophobia. Darrell Hayes ([id number]) Objective Constitutional sitting or standing blood pressure is within target range for patient.. pulse regular and within target range for patient.Marland Kitchen respirations regular, non-labored and within target range for patient.Marland Kitchen temperature within target range for patient.. Well- nourished and well-hydrated in no acute distress. Vitals Time Taken: 1:20 PM, Temperature: 98.2 F, Pulse: 60 bpm, Respiratory Rate: 16 breaths/min, Blood Pressure: 119/59 mmHg. Eyes conjunctiva clear no eyelid edema noted. pupils equal round and reactive to light and accommodation. Ears, Nose, Mouth, and  Throat no gross abnormality of ear auricles or external auditory canals. patient has hearing loss. mucus membranes moist. Respiratory normal breathing without difficulty. clear to auscultation bilaterally. Cardiovascular regular rate and  rhythm with normal S1, S2. 2+ dorsalis pedis/posterior tibialis pulses. no clubbing, cyanosis, significant edema, Gastrointestinal (GI) soft, non-tender, non-distended, +BS. no ventral hernia noted. Musculoskeletal normal gait and posture. no significant deformity or arthritic changes, no loss or range of motion, no clubbing. General Notes: Patient's wound bed currently did show evidence of hyper granulation which did require sharp debridement to pare down the hyper granular tissue today down to good subcutaneous wound bed. He tolerated this debridement today without complication and post debridement the wound bed appears to be doing excellent. Patient has excellent blood flow seemingly into his lower extremities with good arterial ABIs noted to be normal today. He has no significant lower extremity edema at this time which is also good news. Integumentary (Hair, Skin) Wound #1 status is Open. Original cause of wound was Trauma. The wound is located on the Right,Medial Knee. The wound measures 1cm length x 1.4cm width x 0.1cm depth; 1.1cm^2 area and 0.11cm^3 volume. There is Fat Layer (Subcutaneous Tissue) Exposed exposed. There is no tunneling or undermining noted. There is a small amount of serosanguineous drainage noted. The wound margin is flat and intact. There is medium (34-66%) red granulation within the wound bed. There is a small (1-33%) amount of necrotic tissue within the wound bed including Adherent Slough. Assessment Active Problems ICD-10 Type 2 diabetes mellitus with other skin ulcer Bakken, Graden C. ([id number]) Non-pressure chronic ulcer of other part of right lower leg with fat layer exposed Essential (primary) hypertension Paroxysmal atrial fibrillation Long term (current) use of anticoagulants Procedures Wound #1 Pre-procedure diagnosis of Wound #1 is a Diabetic Wound/Ulcer of the Lower Extremity located on the Right,Medial Knee .Severity of Tissue Pre Debridement is:  Fat layer exposed. There was a Excisional Skin/Subcutaneous Tissue Debridement with a total area of 1.4 sq cm performed by STONE III, HOYT E., PA-C. With the following instrument(s): Curette to remove Viable and Non-Viable tissue/material. Material removed includes Subcutaneous Tissue and Hyper-granulation and after achieving pain control using Lidocaine 4% Topical Solution. No specimens were taken. A time out was conducted at 14:16, prior to the start of the procedure. A Minimum amount of bleeding was controlled with Pressure. The procedure was tolerated well with a pain level of 0 throughout and a pain level of 0 following the procedure. Post Debridement Measurements: 1cm length x 1.4cm width x 0.2cm depth; 0.22cm^3 volume. Character of Wound/Ulcer Post Debridement is improved. Severity of Tissue Post Debridement is: Fat layer exposed. Post procedure Diagnosis Wound #1: Same as Pre-Procedure Plan Wound Cleansing: Wound #1 Right,Medial Knee: Dial antibacterial soap, wash wounds, rinse and pat dry prior to dressing wounds May Shower, gently pat wound dry prior to applying new dressing. Primary Wound Dressing: Wound #1 Right,Medial Knee: Hydrafera Blue Ready Transfer Secondary Dressing: Wound #1 Right,Medial Knee: Boardered Foam Dressing Dressing Change Frequency: Wound #1 Right,Medial Knee: Change dressing every other day. Follow-up Appointments: Wound #1 Right,Medial Knee: Return Appointment in 2 weeks. Home Health: Wound #1 Right,Medial Knee: [facility] Health Nurse may visit PRN to address patient s wound care needs. FACE TO FACE ENCOUNTER: MEDICARE and MEDICAID PATIENTS: I certify that this patient is under my care and that I had a face-to-face encounter that meets the physician face-to-face encounter requirements with this patient on this date.  The encounter with the patient was in whole or in part for the following MEDICAL CONDITION:  (primary reason for [place]) MEDICAL NECESSITY: I certify, that based on my findings, NURSING services are a medically necessary home health service. HOME BOUND STATUS: I certify that my clinical findings support that this patient is homebound (i.e., Due to illness or injury, pt requires aid of supportive devices such as crutches, cane, wheelchairs, walkers, the use of special transportation or the assistance of another person to leave their place of residence. There is a normal inability to leave the Goehner, RODRICUS CANDELARIA. ([id number]) home and doing so requires considerable and taxing effort. Other absences are for medical reasons / religious services and are infrequent or of short duration when for other reasons). If current dressing causes regression in wound condition, may D/C ordered dressing product/s and apply Normal Saline Moist Dressing daily until next [place] / Other MD appointment. [place] of regression in wound condition at [phone]. Please direct any NON-WOUND related issues/requests for orders to patient's Primary Care Physician 1. I would recommend currently that we go ahead and initiate treatment with a Hydrofera Blue dressing I do not think we need to do silver nitrate today if the bleeding does not stop we will use some just to get the bleeding to stop. 2. With regard to the wound itself I think that the [facility] alone will likely keep the hyper granulation down if we need to debride in the future we will do so or even utilize the silver nitrate. 3. He can cover this with a bordered foam dressing and we will see how things do. Overall I think that we will provide some cushioning as well as something for the wound to drain into if needed as well. We will see patient back for reevaluation in 2 weeks here in the clinic. If anything worsens or changes patient will contact our office for additional recommendations. If anything changes with  regard to the status of the wound in the meantime they will contact the office and let us know however they could not come in for visits next week during the time that Dr. Dellia Nims was in the clinic Tuesdays really work out best for them I am okay with that. Electronic Signature(s) Signed: 04/25/2019 2:32:33 PM By: Worthy Keeler PA-C Previous Signature: 04/25/2019 2:28:04 PM Version By: Worthy Keeler PA-C Entered By: Worthy Keeler on 04/25/2019 14:32:33 Andrew Au ([id number]) -------------------------------------------------------------------------------- ROS/PFSH Details Patient Name: Andrew Au Date of Service: 04/25/2019 1:15 PM Medical Record Number: [id number] Patient Account Number: [id number] Date of Birth/Sex: Nov 08, 1940 (78 y.o. M) Treating RN: Harold Barban Primary Care Provider: Durenda Guthrie Other Clinician: Referring Provider: Durenda Guthrie Treating Provider/Extender: Melburn Hake, HOYT Weeks in Treatment: 0 Information Obtained From Patient Constitutional Symptoms (General Health) Complaints and Symptoms: Negative for: Fatigue; Fever; Chills; Marked Weight Change Eyes Complaints and Symptoms: Positive for: Glasses / Contacts Medical History: Negative for: Cataracts; Glaucoma; Optic Neuritis Ear/Nose/Mouth/Throat Complaints and Symptoms: Negative for: Difficult clearing ears; Sinusitis Medical History: Negative for: Chronic sinus problems/congestion; Middle ear problems Hematologic/Lymphatic Complaints and Symptoms: Negative for: Bleeding / Clotting Disorders; Human Immunodeficiency Virus Medical History: Negative for: Anemia; Hemophilia; Human Immunodeficiency Virus; Lymphedema; Sickle Cell Disease Respiratory Complaints and Symptoms: Negative for: Chronic or frequent coughs; Shortness of Breath Medical History: Negative for: Aspiration; Asthma; Chronic Obstructive Pulmonary Disease (COPD); Pneumothorax; Sleep  Apnea; Tuberculosis Cardiovascular Complaints and Symptoms: Negative for: Chest pain; LE edema Medical  History: Positive for: Arrhythmia - Afib; Coronary Artery Disease; Hypertension Negative for: Congestive Heart Failure; Deep Vein Thrombosis; Hypotension; Myocardial Infarction; Peripheral Arterial Disease; Peripheral Venous Disease; Phlebitis; Vasculitis AMARIUS, TOTO ([id number]) Gastrointestinal Complaints and Symptoms: Negative for: Frequent diarrhea; Nausea; Vomiting Medical History: Positive for: Hepatitis B Negative for: Cirrhosis ; Colitis; Crohnos; Hepatitis A; Hepatitis C Endocrine Complaints and Symptoms: Negative for: Hepatitis; Thyroid disease; Polydypsia (Excessive Thirst) Medical History: Positive for: Type II Diabetes Negative for: Type I Diabetes Treated with: Diet Blood sugar tested every day: No Genitourinary Complaints and Symptoms: Negative for: Kidney failure/ Dialysis; Incontinence/dribbling Review of System Notes: CKD Stage 3 Medical History: Negative for: End Stage Renal Disease Immunological Complaints and Symptoms: Negative for: Hives; Itching Medical History: Negative for: Lupus Erythematosus; Raynaudos; Scleroderma Integumentary (Skin) Complaints and Symptoms: Positive for: Wounds Negative for: Bleeding or bruising tendency; Breakdown; Swelling Medical History: Negative for: History of Burn; History of pressure wounds Musculoskeletal Complaints and Symptoms: Negative for: Muscle Pain; Muscle Weakness Medical History: Negative for: Gout; Rheumatoid Arthritis; Osteoarthritis; Osteomyelitis Neurologic Complaints and Symptoms: Positive for: Numbness/parasthesias; Focal/Weakness MICHAELPAUL, APO ([id number]) Review of System 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 Medical History: Negative for: Dementia; Neuropathy; Quadriplegia; Paraplegia; Seizure Disorder Psychiatric Complaints and Symptoms: Negative for: Anxiety; Claustrophobia Medical  History: Negative for: Anorexia/bulimia; Confinement Anxiety Oncologic Medical History: Negative for: Received Chemotherapy; Received Radiation Immunizations Pneumococcal Vaccine: Received Pneumococcal Vaccination: No Implantable Devices None Family and Social History Cancer: No; Diabetes: No; Heart Disease: Yes - Mother; Hereditary Spherocytosis: No; Hypertension: No; Kidney Disease: No; Lung Disease: No; Seizures: No; Stroke: Yes - Mother; Thyroid Problems: No; Tuberculosis: No; Former smoker - ended on 07/06/2006; Marital Status - Divorced; Alcohol Use: Never; Drug Use: No History; Caffeine Use: Daily - coffee tea; Financial Concerns: No; Food, Clothing or Shelter Needs: No; Support System Lacking: No; Transportation Concerns: No Electronic Signature(s) Signed: 04/25/2019 4:21:49 PM By: Arnette Norris Signed: 04/25/2019 7:13:08 PM By: Lenda Kelp PA-C Entered By: Arnette Norris on 04/25/2019 13:36:57 Darrell Hayes ([id number]) -------------------------------------------------------------------------------- SuperBill Details Patient Name: Darrell Hayes Date of Service: 04/25/2019 Medical Record Number: [id number] Patient Account Number: [id number] Date of Birth/Sex: [birth year] (78 y.o. M) Treating RN: Curtis Sites Primary Care Provider: Hinda Lenis Other Clinician: Referring Provider: Hinda Lenis Treating Provider/Extender: Linwood Dibbles, HOYT Weeks in Treatment: 0 Diagnosis Coding ICD-10 Codes Code Description E11.622 Type 2 diabetes mellitus with other skin ulcer L97.812 Non-pressure chronic ulcer of other part of right lower leg with fat layer exposed I10 Essential (primary) hypertension I48.0 Paroxysmal atrial fibrillation Z79.01 Long term (current) use of anticoagulants Facility Procedures CPT4 Code Description: 53299242 99213 - WOUND CARE VISIT-LEV 3 EST PT Modifier: Quantity: 1 CPT4 Code Description: [id number] 11042 - DEB SUBQ TISSUE 20 SQ CM/< ICD-10  Diagnosis Description L97.812 Non-pressure chronic ulcer of other part of right lower leg wit Modifier: h fat layer expo Quantity: 1 sed Physician Procedures CPT4 Code Description: [id number] WC PHYS LEVEL 3 o NEW PT ICD-10 Diagnosis Description E11.622 Type 2 diabetes mellitus with other skin ulcer L97.812 Non-pressure chronic ulcer of other part of right lower leg wi I10 Essential (primary) hypertension I48.0  Paroxysmal atrial fibrillation Modifier: 25 th fat layer expo Quantity: 1 sed CPT4 Code Description: 2119417 11042 - WC PHYS SUBQ TISS 20 SQ CM ICD-10 Diagnosis Description L97.812 Non-pressure chronic ulcer of other part of right lower leg wi Modifier: th fat layer expo Quantity: 1 sed Electronic Signature(s) Signed: 04/25/2019 2:28:25 PM By: Lenda Kelp PA-C Entered By: Larina Bras  IIILeonard Schwartz on 04/25/2019 14:28:25

## 2019-05-09 ENCOUNTER — Other Ambulatory Visit: Payer: Self-pay

## 2019-05-09 ENCOUNTER — Encounter: Payer: Medicare (Managed Care) | Attending: Physician Assistant | Admitting: Physician Assistant

## 2019-05-09 DIAGNOSIS — L97812 Non-pressure chronic ulcer of other part of right lower leg with fat layer exposed: Secondary | ICD-10-CM | POA: Insufficient documentation

## 2019-05-09 DIAGNOSIS — I2510 Atherosclerotic heart disease of native coronary artery without angina pectoris: Secondary | ICD-10-CM | POA: Insufficient documentation

## 2019-05-09 DIAGNOSIS — E11622 Type 2 diabetes mellitus with other skin ulcer: Secondary | ICD-10-CM | POA: Insufficient documentation

## 2019-05-09 DIAGNOSIS — Z7901 Long term (current) use of anticoagulants: Secondary | ICD-10-CM | POA: Insufficient documentation

## 2019-05-09 DIAGNOSIS — Z8249 Family history of ischemic heart disease and other diseases of the circulatory system: Secondary | ICD-10-CM | POA: Insufficient documentation

## 2019-05-09 DIAGNOSIS — W19XXXA Unspecified fall, initial encounter: Secondary | ICD-10-CM | POA: Insufficient documentation

## 2019-05-09 DIAGNOSIS — I480 Paroxysmal atrial fibrillation: Secondary | ICD-10-CM | POA: Diagnosis not present

## 2019-05-09 DIAGNOSIS — Z87891 Personal history of nicotine dependence: Secondary | ICD-10-CM | POA: Diagnosis not present

## 2019-05-09 DIAGNOSIS — I10 Essential (primary) hypertension: Secondary | ICD-10-CM | POA: Diagnosis not present

## 2019-05-09 NOTE — Progress Notes (Addendum)
Darrell Hayes ([id number]) Visit Report for 05/09/2019 Chief Complaint Document Details Patient Name: Darrell Hayes Date of Service: 05/09/2019 11:00 AM Medical Record Number: [id number] Patient Account Number: [id number] Date of Birth/Sex: 1941/07/06 (78 y.o. M) Treating RN: Montey Hora Primary Care Provider: Durenda Guthrie Other Clinician: Referring Provider: Durenda Guthrie Treating Provider/Extender: Melburn Hake, HOYT Weeks in Treatment: 2 Information Obtained from: Patient Chief Complaint Right knee ulcer Electronic Signature(s) Signed: 05/09/2019 11:28:49 AM By: Worthy Keeler PA-C Entered By: Worthy Keeler on 05/09/2019 11:28:49 Darrell Hayes ([id number]) -------------------------------------------------------------------------------- HPI Details Patient Name: Darrell Hayes Date of Service: 05/09/2019 11:00 AM Medical Record Number: [id number] Patient Account Number: [id number] Date of Birth/Sex: 11/18/1940 (78 y.o. M) Treating RN: Montey Hora Primary Care Provider: Durenda Guthrie Other Clinician: Referring Provider: Durenda Guthrie Treating Provider/Extender: Melburn Hake, HOYT Weeks in Treatment: 2 History of Present Illness HPI Description: 04/25/2019 on evaluation today patient presents for initial inspection here in our clinic concerning issues that he has been having with his right knee he sustained a fall in June 2020 when he fell in the bedroom. 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 Subsequently he is weak and does tend to fall secondary to this. Fortunately there is no signs of active infection at this time which is good news they have been using initially Medihoney for the treatment and then subsequently transition to Xeroform. I believe the biggest issue right now may be that despite the fact they have been using some silver nitrate on the area it has not been enough to completely pare down and get the hyper granulation tissue  under control especially when moisture adding dressing such as Medihoney or Xeroform gauze were added over top of this. Nonetheless the patient is on Coumadin therefore he is likely to bleed we got to be cognizant of this nonetheless he really does need some debridement to clear away the hyper granular tissue to allow this to heal more appropriately. No fevers, chills, nausea, vomiting, or diarrhea. The patient does have a history of hypertension, paroxysmal atrial fibrillation for which she is on long-term anticoagulant therapy, and diabetes mellitus type 2 although he tells me even though this is in his chart that he is considered "prediabetic" I do not believe he is on any medications at this point for diabetes. 05/09/2019 on evaluation today patient's knee ulcer actually appears to be doing quite well. He has a lot of healing noted and in fact the original wound that I remember seen actually seems to be healed today. With that being said he is having an issue right now with some of the irritation surrounding the actual wound opening where he has some skin breakdown I think the dressing has been sticking to this region unfortunately. We may want to try a contact layer underneath the [facility] and see if that will help these final areas heal up. Electronic Signature(s) Signed: 05/11/2019 8:59:56 AM By: Worthy Keeler PA-C Entered By: Worthy Keeler on 05/11/2019 08:59:55 Darrell Hayes ([id number]) -------------------------------------------------------------------------------- Physical Exam Details Patient Name: Darrell Hayes Date of Service: 05/09/2019 11:00 AM Medical Record Number: [id number] Patient Account Number: [id number] Date of Birth/Sex: 10-07-1940 (78 y.o. M) Treating RN: Montey Hora Primary Care Provider: Durenda Guthrie Other Clinician: Referring Provider: Durenda Guthrie Treating Provider/Extender: STONE III, HOYT Weeks in Treatment:  2 Constitutional Well-nourished and well-hydrated in no acute distress. Respiratory normal breathing without difficulty. clear to auscultation bilaterally. Cardiovascular regular rate and  rhythm with normal S1, S2. Psychiatric this patient is able to make decisions and demonstrates good insight into disease process. Alert and Oriented x 3. pleasant and cooperative. Notes Patient's wound bed currently showed signs of good granulation at this time the does not appear to be evidence of active infection which is good news. No fevers, chills, nausea, vomiting, or diarrhea. No sharp debridement was necessary and again the original wound which we were really taking care of appears to likely be healed although there is a lot of skin breakdown around this that I think is due to the dressing sticking to the surrounding skin regions. Electronic Signature(s) Signed: 05/11/2019 9:00:27 AM By: Lenda Kelp PA-C Entered By: Lenda Kelp on 05/11/2019 09:00:27 Darrell Hayes ([id number]) -------------------------------------------------------------------------------- Physician Orders Details Patient Name: Darrell Hayes Date of Service: 05/09/2019 11:00 AM Medical Record Number: [id number] Patient Account Number: [id number] Date of Birth/Sex: 06-Jan-1941 (78 y.o. M) Treating RN: Curtis Sites Primary Care Provider: Hinda Lenis Other Clinician: Referring Provider: Hinda Lenis Treating Provider/Extender: Linwood Dibbles, HOYT Weeks in Treatment: 2 Verbal / Phone Orders: No Diagnosis Coding ICD-10 Coding Code Description E11.622 Type 2 diabetes mellitus with other skin ulcer L97.812 Non-pressure chronic ulcer of other part of right lower leg with fat layer exposed I10 Essential (primary) hypertension I48.0 Paroxysmal atrial fibrillation Z79.01 Long term (current) use of anticoagulants Wound Cleansing Wound #1 Right,Medial Knee o Dial antibacterial soap, wash wounds, rinse and pat dry  prior to dressing wounds o May Shower, gently pat wound dry prior to applying new dressing. Primary Wound Dressing Wound #1 Right,Medial Knee o Hydrafera Blue Ready Transfer o Other: - adaptic or oil emulsion contact layer Secondary Dressing Wound #1 Right,Medial Knee o Boardered Foam Dressing Dressing Change Frequency Wound #1 Right,Medial Knee o Change dressing every other day. Follow-up Appointments Wound #1 Right,Medial Knee o Return Appointment in 1 week. Home Health Wound #1 Right,Medial Knee o Continue Home Health Visits - [facility] o Home Health Nurse may visit PRN to address patientos wound care needs. o FACE TO FACE ENCOUNTER: MEDICARE and MEDICAID PATIENTS: I certify that this patient is under my care and that I had a face-to-face encounter that meets the physician face-to-face encounter requirements with this patient on this date. The encounter with the patient was in whole or in part for the following MEDICAL CONDITION: (primary reason for Home Healthcare) MEDICAL NECESSITY: I certify, that based on my findings, NURSING services are a medically necessary home health service. HOME BOUND STATUS: I certify that my clinical findings support that this patient is homebound (i.e., Due to illness or injury, pt requires aid of supportive devices such as crutches, cane, wheelchairs, walkers, the use of special transportation or the McDonald, FLORENCE ANTONELLI. ([id number]) assistance of another person to leave their place of residence. There is a normal inability to leave the home and doing so requires considerable and taxing effort. Other absences are for medical reasons / religious services and are infrequent or of short duration when for other reasons). o If current dressing causes regression in wound condition, may D/C ordered dressing product/s and apply Normal Saline Moist Dressing daily until next Wound Healing Center / Other MD appointment. Notify  Wound Healing Center of regression in wound condition at [phone]. o Please direct any NON-WOUND related issues/requests for orders to patient's Primary Care Physician Electronic Signature(s) Signed: 05/09/2019 4:57:37 PM By: Curtis Sites Signed: 05/11/2019 6:06:55 PM By: Lenda Kelp PA-C Entered By: Curtis Sites on 05/09/2019  11:38:53 NIKKI, RUSNAK ([id number]) -------------------------------------------------------------------------------- Problem List Details Patient Name: CAMARA, ROSANDER Date of Service: 05/09/2019 11:00 AM Medical Record Number: [id number] Patient Account Number: [id number] Date of Birth/Sex: January 15, 1941 (78 y.o. M) Treating RN: Curtis Sites Primary Care Provider: Hinda Lenis Other Clinician: Referring Provider: Hinda Lenis Treating Provider/Extender: Linwood Dibbles, HOYT Weeks in Treatment: 2 Active Problems ICD-10 Evaluated Encounter Code Description Active Date Today Diagnosis E11.622 Type 2 diabetes mellitus with other skin ulcer 04/25/2019 No Yes L97.812 Non-pressure chronic ulcer of other part of right lower leg 04/25/2019 No Yes with fat layer exposed I10 Essential (primary) hypertension 04/25/2019 No Yes I48.0 Paroxysmal atrial fibrillation 04/25/2019 No Yes Z79.01 Long term (current) use of anticoagulants 04/25/2019 No Yes Inactive Problems Resolved Problems Electronic Signature(s) Signed: 05/09/2019 11:28:43 AM By: Lenda Kelp PA-C Entered By: Lenda Kelp on 05/09/2019 11:28:42 Darrell Hayes ([id number]) -------------------------------------------------------------------------------- Progress Note Details Patient Name: Darrell Hayes Date of Service: 05/09/2019 11:00 AM Medical Record Number: [id number] Patient Account Number: [id number] Date of Birth/Sex: 1941-02-19 (78 y.o. M) Treating RN: Curtis Sites Primary Care Provider: Hinda Lenis Other Clinician: Referring Provider: Hinda Lenis Treating  Provider/Extender: Linwood Dibbles, HOYT Weeks in Treatment: 2 Subjective Chief Complaint Information obtained from Patient Right knee ulcer History of Present Illness (HPI) 04/25/2019 on evaluation today patient presents for initial inspection here in our clinic concerning issues that he has been having with his right knee he sustained a fall in June 2020 when he fell in the bedroom. 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 Subsequently he is weak and does tend to fall secondary to this. Fortunately there is no signs of active infection at this time which is good news they have been using initially Medihoney for the treatment and then subsequently transition to Xeroform. I believe the biggest issue right now may be that despite the fact they have been using some silver nitrate on the area it has not been enough to completely pare down and get the hyper granulation tissue under control especially when moisture adding dressing such as Medihoney or Xeroform gauze were added over top of this. Nonetheless the patient is on Coumadin therefore he is likely to bleed we got to be cognizant of this nonetheless he really does need some debridement to clear away the hyper granular tissue to allow this to heal more appropriately. No fevers, chills, nausea, vomiting, or diarrhea. The patient does have a history of hypertension, paroxysmal atrial fibrillation for which she is on long- term anticoagulant therapy, and diabetes mellitus type 2 although he tells me even though this is in his chart that he is considered "prediabetic" I do not believe he is on any medications at this point for diabetes. 05/09/2019 on evaluation today patient's knee ulcer actually appears to be doing quite well. He has a lot of healing noted and in fact the original wound that I remember seen actually seems to be healed today. With that being said he is having an issue right now with some of the irritation surrounding the  actual wound opening where he has some skin breakdown I think the dressing has been sticking to this region unfortunately. We may want to try a contact layer underneath the [facility] and see if that will help these final areas heal up. Patient History Information obtained from Patient. Family History Heart Disease - Mother, Stroke - Mother, No family history of Cancer, Diabetes, Hereditary Spherocytosis, Hypertension, Kidney Disease, Lung Disease, Seizures, Thyroid  Problems, Tuberculosis. Social History Former smoker - ended on 07/06/2006, Marital Status - Divorced, Alcohol Use - Never, Drug Use - No History, Caffeine Use - Daily - coffee tea. Medical History Eyes Denies history of Cataracts, Glaucoma, Optic Neuritis Ear/Nose/Mouth/Throat Denies history of Chronic sinus problems/congestion, Middle ear problems Hematologic/Lymphatic Denies history of Anemia, Hemophilia, Human Immunodeficiency Virus, Lymphedema, Sickle Cell Disease Respiratory Denies history of Aspiration, Asthma, Chronic Obstructive Pulmonary Disease (COPD), Pneumothorax, Sleep Apnea, JAAN, FISCHEL ([id number]) Tuberculosis Cardiovascular Patient has history of Arrhythmia - Afib, Coronary Artery Disease, Hypertension Denies history of Congestive Heart Failure, Deep Vein Thrombosis, Hypotension, Myocardial Infarction, Peripheral Arterial Disease, Peripheral Venous Disease, Phlebitis, Vasculitis Gastrointestinal Patient has history of Hepatitis B Denies history of Cirrhosis , Colitis, Crohn s, Hepatitis A, Hepatitis C Endocrine Patient has history of Type II Diabetes Denies history of Type I Diabetes Genitourinary Denies history of End Stage Renal Disease Immunological Denies history of Lupus Erythematosus, Raynaud s, Scleroderma Integumentary (Skin) Denies history of History of Burn, History of pressure wounds Musculoskeletal Denies history of Gout, Rheumatoid Arthritis, Osteoarthritis,  Osteomyelitis Neurologic Denies history of Dementia, Neuropathy, Quadriplegia, Paraplegia, Seizure Disorder Oncologic Denies history of Received Chemotherapy, Received Radiation Psychiatric Denies history of Anorexia/bulimia, Confinement Anxiety Review of Systems (ROS) Constitutional Symptoms (General Health) Denies complaints or symptoms of Fatigue, Fever, Chills, Marked Weight Change. Respiratory Denies complaints or symptoms of Chronic or frequent coughs, Shortness of Breath. Cardiovascular Denies complaints or symptoms of Chest pain, LE edema. Psychiatric Denies complaints or symptoms of Anxiety, Claustrophobia. Objective Constitutional Well-nourished and well-hydrated in no acute distress. Vitals Time Taken: 11:20 AM, Height: 69 in, Source: Stated, Weight: 190 lbs, Source: Stated, BMI: 28.1, Temperature: 97.8  F, Pulse: 67 bpm, Respiratory Rate: 18 breaths/min, Blood Pressure: 109/54 mmHg. Respiratory normal breathing without difficulty. clear to auscultation bilaterally. Cardiovascular regular rate and rhythm with normal S1, S2. Rexrode, Gregorio C. ([id number]) Psychiatric this patient is able to make decisions and demonstrates good insight into disease process. Alert and Oriented x 3. pleasant and cooperative. General Notes: Patient's wound bed currently showed signs of good granulation at this time the does not appear to be evidence of active infection which is good news. No fevers, chills, nausea, vomiting, or diarrhea. No sharp debridement was necessary and again the original wound which we were really taking care of appears to likely be healed although there is a lot of skin breakdown around this that I think is due to the dressing sticking to the surrounding skin regions. Integumentary (Hair, Skin) Wound #1 status is Open. Original cause of wound was Trauma. The wound is located on the Right,Medial Knee. The wound measures 0.7cm length x 1cm width x 0.1cm depth; 0.55cm^2  area and 0.055cm^3 volume. There is Fat Layer (Subcutaneous Tissue) Exposed exposed. There is no tunneling or undermining noted. There is a small amount of serosanguineous drainage noted. The wound margin is flat and intact. There is medium (34-66%) red granulation within the wound bed. There is a small (1-33%) amount of necrotic tissue within the wound bed including Adherent Slough. Assessment Active Problems ICD-10 Type 2 diabetes mellitus with other skin ulcer Non-pressure chronic ulcer of other part of right lower leg with fat layer exposed Essential (primary) hypertension Paroxysmal atrial fibrillation Long term (current) use of anticoagulants Plan Wound Cleansing: Wound #1 Right,Medial Knee: Dial antibacterial soap, wash wounds, rinse and pat dry prior to dressing wounds May Shower, gently pat wound dry prior to applying new dressing. Primary Wound Dressing: Wound #1 Right,Medial Knee:  Hydrafera Blue Ready Transfer Other: - adaptic or oil emulsion contact layer Secondary Dressing: Wound #1 Right,Medial Knee: Boardered Foam Dressing Dressing Change Frequency: Wound #1 Right,Medial Knee: Change dressing every other day. Follow-up Appointments: Wound #1 Right,Medial Knee: Return Appointment in 1 week. Home Health: Wound #1 Right,Medial Knee: [facility] Visits - [facility] [place] [facility] DARRIAN, GRZELAK ([id number]) Home Health Nurse may visit PRN to address patient s wound care needs. FACE TO FACE ENCOUNTER: MEDICARE and MEDICAID PATIENTS: I certify that this patient is under my care and that I had a face-to-face encounter that meets the physician face-to-face encounter requirements with this patient on this date. The encounter with the patient was in whole or in part for the following MEDICAL CONDITION: (primary reason for Home Healthcare) MEDICAL NECESSITY: I certify, that based on my findings, NURSING services are a medically necessary home health service. HOME BOUND  STATUS: I certify that my clinical findings support that this patient is homebound (i.e., Due to illness or injury, pt requires aid of supportive devices such as crutches, cane, wheelchairs, walkers, the use of special transportation or the assistance of another person to leave their place of residence. There is a normal inability to leave the home and doing so requires considerable and taxing effort. Other absences are for medical reasons / religious services and are infrequent or of short duration when for other reasons). If current dressing causes regression in wound condition, may D/C ordered dressing product/s and apply Normal Saline Moist Dressing daily until next Wound Healing Center / Other MD appointment. Notify Wound Healing Center of regression in wound condition at [phone]. Please direct any NON-WOUND related issues/requests for orders to patient's Primary Care Physician 1. I am in a suggest that we continue with the [facility] although we will add a contact layer underneath things for today I think that is appropriate. 2. I am also going to suggest that we go ahead and continue to cover this with a border foam dressing which I think has been beneficial as well as far as protecting the knee area. We will see patient back for reevaluation in 1 week here in the clinic. If anything worsens or changes patient will contact our office for additional recommendations. Electronic Signature(s) Signed: 05/11/2019 9:01:06 AM By: Lenda Kelp PA-C Entered By: Lenda Kelp on 05/11/2019 09:01:05 Darrell Hayes ([id number]) -------------------------------------------------------------------------------- ROS/PFSH Details Patient Name: Darrell Hayes Date of Service: 05/09/2019 11:00 AM Medical Record Number: [id number] Patient Account Number: [id number] Date of Birth/Sex: 03/31/1941 (78 y.o. M) Treating RN: Curtis Sites Primary Care Provider: Hinda Lenis Other  Clinician: Referring Provider: Hinda Lenis Treating Provider/Extender: Linwood Dibbles, HOYT Weeks in Treatment: 2 Information Obtained From Patient Constitutional Symptoms (General Health) Complaints and Symptoms: Negative for: Fatigue; Fever; Chills; Marked Weight Change Respiratory Complaints and Symptoms: Negative for: Chronic or frequent coughs; Shortness of Breath Medical History: Negative for: Aspiration; Asthma; Chronic Obstructive Pulmonary Disease (COPD); Pneumothorax; Sleep Apnea; Tuberculosis Cardiovascular Complaints and Symptoms: Negative for: Chest pain; LE edema Medical History: Positive for: Arrhythmia - Afib; Coronary Artery Disease; Hypertension Negative for: Congestive Heart Failure; Deep Vein Thrombosis; Hypotension; Myocardial Infarction; Peripheral Arterial Disease; Peripheral Venous Disease; Phlebitis; Vasculitis Psychiatric Complaints and Symptoms: Negative for: Anxiety; Claustrophobia Medical History: Negative for: Anorexia/bulimia; Confinement Anxiety Eyes Medical History: Negative for: Cataracts; Glaucoma; Optic Neuritis Ear/Nose/Mouth/Throat Medical History: Negative for: Chronic sinus problems/congestion; Middle ear problems Hematologic/Lymphatic Medical History: Negative for: Anemia; Hemophilia; Human Immunodeficiency Virus; Lymphedema; Sickle Cell Disease Hayes, Darrell C. (  [id number]) Gastrointestinal Medical History: Positive for: Hepatitis B Negative for: Cirrhosis ; Colitis; Crohnos; Hepatitis A; Hepatitis C Endocrine Medical History: Positive for: Type II Diabetes Negative for: Type I Diabetes Treated with: Diet Blood sugar tested every day: No Genitourinary Medical History: Negative for: End Stage Renal Disease Immunological Medical History: Negative for: Lupus Erythematosus; Raynaudos; Scleroderma Integumentary (Skin) Medical History: Negative for: History of Burn; History of pressure wounds Musculoskeletal Medical  History: Negative for: Gout; Rheumatoid Arthritis; Osteoarthritis; Osteomyelitis Neurologic Medical History: Negative for: Dementia; Neuropathy; Quadriplegia; Paraplegia; Seizure Disorder Oncologic Medical History: Negative for: Received Chemotherapy; Received Radiation Immunizations Pneumococcal Vaccine: Received Pneumococcal Vaccination: No Implantable Devices None Family and Social History Cancer: No; Diabetes: No; Heart Disease: Yes - Mother; Hereditary Spherocytosis: No; Hypertension: No; Kidney Disease: No; Lung Disease: No; Seizures: No; Stroke: Yes - Mother; Thyroid Problems: No; Tuberculosis: No; Former smoker - ended on 07/06/2006; Marital Status - Divorced; Alcohol Use: Never; Drug Use: No History; Caffeine Use: Daily - coffee tea; Financial Concerns: No; Food, Clothing or Shelter Needs: No; Support System Lacking: No; Transportation Concerns: No Physician Affirmation I have reviewed and agree with the above information. Darrell Hayes, Darrell C. ([id number]) Electronic Signature(s) Signed: 05/11/2019 6:06:55 PM By: Lenda KelpStone III, Hoyt PA-C Signed: 05/24/2019 4:47:24 PM By: Curtis Sitesorthy, Joanna Entered By: Lenda KelpStone III, Hoyt on 05/11/2019 09:00:08 Darrell Hayes, Kemontae C. ([id number]) -------------------------------------------------------------------------------- SuperBill Details Patient Name: Darrell Hayes, Kimo C. Date of Service: 05/09/2019 Medical Record Number: [id number] Patient Account Number: [id number] Date of Birth/Sex: 06/23/1941 (78 y.o. M) Treating RN: Curtis Sitesorthy, Joanna Primary Care Provider: Hinda LenisEILLY, SHARON Other Clinician: Referring Provider: Hinda LenisEILLY, SHARON Treating Provider/Extender: Linwood DibblesSTONE III, HOYT Weeks in Treatment: 2 Diagnosis Coding ICD-10 Codes Code Description E11.622 Type 2 diabetes mellitus with other skin ulcer L97.812 Non-pressure chronic ulcer of other part of right lower leg with fat layer exposed I10 Essential (primary) hypertension I48.0 Paroxysmal atrial fibrillation Z79.01  Long term (current) use of anticoagulants Facility Procedures CPT4 Code: [id number] Description: 99213 - WOUND CARE VISIT-LEV 3 EST PT Modifier: Quantity: 1 Physician Procedures CPT4 Code Description: [id number] 99214 - WC PHYS LEVEL 4 - EST PT ICD-10 Diagnosis Description E11.622 Type 2 diabetes mellitus with other skin ulcer L97.812 Non-pressure chronic ulcer of other part of right lower leg w I10 Essential (primary) hypertension  I48.0 Paroxysmal atrial fibrillation Modifier: ith fat layer expo Quantity: 1 sed Electronic Signature(s) Signed: 05/11/2019 9:01:23 AM By: Lenda KelpStone III, Hoyt PA-C Entered By: Lenda KelpStone III, Hoyt on 05/11/2019 09:01:23

## 2019-05-10 NOTE — Progress Notes (Signed)
Darrell Hayes, Darrell Hayes ([id number]) Visit Report for 05/09/2019 Arrival Information Details Patient Name: Darrell Hayes, Darrell Hayes Date of Service: 05/09/2019 11:00 AM Medical Record Number: [id number] Patient Account Number: [id number] Date of Birth/Sex: Nov 06, 1940 (78 y.o. M) Treating RN: Harold Barban Primary Care Joleah Kosak: Durenda Guthrie Other Clinician: Referring Jhalen Eley: Durenda Guthrie Treating Lorrinda Ramstad/Extender: Melburn Hake, HOYT Weeks in Treatment: 2 Visit Information History Since Last Visit Added or deleted any medications: No Patient Arrived: Wheel Chair Any new allergies or adverse reactions: No Arrival Time: 11:14 Had a fall or experienced change in No Accompanied By: family activities of daily living that may affect Transfer Assistance: None risk of falls: Patient Identification Verified: Yes Signs or symptoms of abuse/neglect since last visito No Secondary Verification Process Completed: Yes Hospitalized since last visit: No Has Dressing in Place as Prescribed: Yes Pain Present Now: No Electronic Signature(s) Signed: 05/09/2019 4:24:18 PM By: Harold Barban Entered By: Harold Barban on 05/09/2019 11:15:03 Darrell Hayes ([id number]) -------------------------------------------------------------------------------- Clinic Level of Care Assessment Details Patient Name: Darrell Hayes Date of Service: 05/09/2019 11:00 AM Medical Record Number: [id number] Patient Account Number: [id number] Date of Birth/Sex: 09-Jan-1941 (78 y.o. M) Treating RN: Montey Hora Primary Care Axie Hayne: Durenda Guthrie Other Clinician: Referring Lucio Litsey: Durenda Guthrie Treating Jax Abdelrahman/Extender: Melburn Hake, HOYT Weeks in Treatment: 2 Clinic Level of Care Assessment Items TOOL 4 Quantity Score []  - Use when only an EandM is performed on FOLLOW-UP visit 0 ASSESSMENTS - Nursing Assessment / Reassessment X - Reassessment of Co-morbidities (includes updates in patient status) 1 10 X- 1 5 Reassessment  of Adherence to Treatment Plan ASSESSMENTS - Wound and Skin Assessment / Reassessment X - Simple Wound Assessment / Reassessment - one wound 1 5 []  - 0 Complex Wound Assessment / Reassessment - multiple wounds []  - 0 Dermatologic / Skin Assessment (not related to wound area) ASSESSMENTS - Focused Assessment []  - Circumferential Edema Measurements - multi extremities 0 []  - 0 Nutritional Assessment / Counseling / Intervention X- 1 5 Lower Extremity Assessment (monofilament, tuning fork, pulses) []  - 0 Peripheral Arterial Disease Assessment (using hand held doppler) ASSESSMENTS - Ostomy and/or Continence Assessment and Care []  - Incontinence Assessment and Management 0 []  - 0 Ostomy Care Assessment and Management (repouching, etc.) PROCESS - Coordination of Care X - Simple Patient / Family Education for ongoing care 1 15 []  - 0 Complex (extensive) Patient / Family Education for ongoing care X- 1 10 Staff obtains Programmer, systems, Records, Test Results / Process Orders []  - 0 Staff telephones HHA, Nursing Homes / Clarify orders / etc []  - 0 Routine Transfer to another Facility (non-emergent condition) []  - 0 Routine Hospital Admission (non-emergent condition) []  - 0 New Admissions / Biomedical engineer / Ordering NPWT, Apligraf, etc. []  - 0 Emergency Hospital Admission (emergent condition) X- 1 10 Simple Discharge Coordination ARNELL, SLIVINSKI ([id number]) []  - 0 Complex (extensive) Discharge Coordination PROCESS - Special Needs []  - Pediatric / Minor Patient Management 0 []  - 0 Isolation Patient Management []  - 0 Hearing / Language / Visual special needs []  - 0 Assessment of Community assistance (transportation, D/C planning, etc.) []  - 0 Additional assistance / Altered mentation []  - 0 Support Surface(s) Assessment (bed, cushion, seat, etc.) INTERVENTIONS - Wound Cleansing / Measurement X - Simple Wound Cleansing - one wound 1 5 []  - 0 Complex Wound Cleansing -  multiple wounds X- 1 5 Wound Imaging (photographs - any number of wounds) []  - 0 Wound Tracing (instead of photographs) X- 1 5 Simple  Wound Measurement - one wound  - 0 Complex Wound Measurement - multiple wounds INTERVENTIONS - Wound Dressings X - Small Wound Dressing one or multiple wounds 1 10  - 0 Medium Wound Dressing one or multiple wounds  - 0 Large Wound Dressing one or multiple wounds  - 0 Application of Medications - topical  - 0 Application of Medications - injection INTERVENTIONS - Miscellaneous  - External ear exam 0  - 0 Specimen Collection (cultures, biopsies, blood, body fluids, etc.)  - 0 Specimen(s) / Culture(s) sent or taken to Lab for analysis  - 0 Patient Transfer (multiple staff / Michiel Sites Lift / Similar devices)  - 0 Simple Staple / Suture removal (25 or less)  - 0 Complex Staple / Suture removal (26 or more)  - 0 Hypo / Hyperglycemic Management (close monitor of Blood Glucose)  - 0 Ankle / Brachial Index (ABI) - do not check if billed separately X- 1 5 Vital Signs Adamik, Elige C. ([id number]) Has the patient been seen at the hospital within the last three years: Yes Total Score: 90 Level Of Care: New/Established - Level 3 Electronic Signature(s) Signed: 05/09/2019 4:57:37 PM By: Curtis Sites Entered By: Curtis Sites on 05/09/2019 11:50:20 Darrell Hayes ([id number]) -------------------------------------------------------------------------------- Encounter Discharge Information Details Patient Name: Darrell Hayes Date of Service: 05/09/2019 11:00 AM Medical Record Number: [id number] Patient Account Number: [id number] Date of Birth/Sex: [birth year] (78 y.o. M) Treating RN: Curtis Sites Primary Care Delanna Blacketer: Hinda Lenis Other Clinician: Referring Sherley Leser: Hinda Lenis Treating De Libman/Extender: Skeet Simmer in Treatment: 2 Encounter Discharge Information Items Discharge Condition:  Stable Ambulatory Status: Ambulatory Discharge Destination: Home Transportation: Private Auto Accompanied By: self Schedule Follow-up Appointment: Yes Clinical Summary of Care: Electronic Signature(s) Signed: 05/09/2019 11:52:22 AM By: Curtis Sites Entered By: Curtis Sites on 05/09/2019 11:52:21 Darrell Hayes ([id number]) -------------------------------------------------------------------------------- Lower Extremity Assessment Details Patient Name: Darrell Hayes Date of Service: 05/09/2019 11:00 AM Medical Record Number: [id number] Patient Account Number: [id number] Date of Birth/Sex: 03-26-1941 (78 y.o. M) Treating RN: Arnette Norris Primary Care Jerran Tappan: Hinda Lenis Other Clinician: Referring Darsha Zumstein: Hinda Lenis Treating Rees Santistevan/Extender: Linwood Dibbles, HOYT Weeks in Treatment: 2 Electronic Signature(s) Signed: 05/09/2019 4:24:18 PM By: Arnette Norris Entered By: Arnette Norris on 05/09/2019 11:25:59 Darrell Hayes ([id number]) -------------------------------------------------------------------------------- Multi Wound Chart Details Patient Name: Darrell Hayes Date of Service: 05/09/2019 11:00 AM Medical Record Number: [id number] Patient Account Number: [id number] Date of Birth/Sex: Sep 15, 1940 (78 y.o. M) Treating RN: Curtis Sites Primary Care Mahkayla Preece: Hinda Lenis Other Clinician: Referring Kamaury Cutbirth: Hinda Lenis Treating Adryel Wortmann/Extender: STONE III, HOYT Weeks in Treatment: 2 Vital Signs Height(in): 69 Pulse(bpm): 67 Weight(lbs): 190 Blood Pressure(mmHg): 109/54 Body Mass Index(BMI): 28 Temperature(F): 97.8 Respiratory Rate 18 (breaths/min): Photos: [N/A:N/A] Wound Location: Right Knee - Medial N/A N/A Wounding Event: Trauma N/A N/A Primary Etiology: Diabetic Wound/Ulcer of the N/A N/A Lower Extremity Comorbid History: Arrhythmia, Coronary Artery N/A N/A Disease, Hypertension, Hepatitis B, Type II Diabetes Date Acquired: 12/05/2018 N/A  N/A Weeks of Treatment: 2 N/A N/A Wound Status: Open N/A N/A Measurements L x W x D 0.7x1x0.1 N/A N/A (cm) Area (cm) : 0.55 N/A N/A Volume (cm) : 0.055 N/A N/A % Reduction in Area: 50.00% N/A N/A % Reduction in Volume: 50.00% N/A N/A Classification: Grade 1 N/A N/A Exudate Amount: Small N/A N/A Exudate Type: Serosanguineous N/A N/A Exudate Color: red, brown N/A N/A Wound Margin: Flat and Intact N/A N/A Granulation Amount: Medium (34-66%) N/A N/A Granulation Quality: Red N/A N/A Necrotic Amount:  Small (1-33%) N/A N/A Exposed Structures: Fat Layer (Subcutaneous N/A N/A Tissue) Exposed: Yes Fascia: No Tendon: No Muscle: No Darrell FairWALLER, Darrell C. ([id number]) Joint: No Bone: No Epithelialization: None N/A N/A Treatment Notes Electronic Signature(s) Signed: 05/09/2019 4:57:37 PM By: Curtis Sitesorthy, Joanna Entered By: Curtis Sitesorthy, Joanna on 05/09/2019 11:36:00 Darrell FairWALLER, Darrell C. ([id number]) -------------------------------------------------------------------------------- Multi-Disciplinary Care Plan Details Patient Name: Darrell FairWALLER, Darrell C. Date of Service: 05/09/2019 11:00 AM Medical Record Number: [id number] Patient Account Number: [id number] Date of Birth/Sex: [birth year] (78 y.o. M) Treating RN: Curtis Sitesorthy, Joanna Primary Care Laquitta Dominski: Hinda LenisEILLY, SHARON Other Clinician: Referring Caliann Leckrone: Hinda LenisEILLY, SHARON Treating Ellisha Bankson/Extender: Linwood DibblesSTONE III, HOYT Weeks in Treatment: 2 Active Inactive Abuse / Safety / Falls / Self Care Management Nursing Diagnoses: Potential for falls Goals: Patient will remain injury free related to falls Date Initiated: 04/25/2019 Target Resolution Date: 07/15/2019 Goal Status: Active Interventions: Assess fall risk on admission and as needed Notes: Orientation to the Wound Care Program Nursing Diagnoses: Knowledge deficit related to the wound healing center program Goals: Patient/caregiver will verbalize understanding of the Wound Healing Center Program Date Initiated:  04/25/2019 Target Resolution Date: 07/15/2019 Goal Status: Active Interventions: Provide education on orientation to the wound center Notes: Pressure Nursing Diagnoses: Potential for impaired tissue integrity related to pressure, friction, moisture, and shear Goals: Patient will remain free of pressure ulcers Date Initiated: 04/25/2019 Target Resolution Date: 07/15/2019 Goal Status: Active Interventions: Assess: immobility, friction, shearing, incontinence upon admission and as needed Darrell FairWALLER, Romelo C. ([id number]) Assess potential for pressure ulcer upon admission and as needed Notes: Wound/Skin Impairment Nursing Diagnoses: Impaired tissue integrity Goals: Ulcer/skin breakdown will heal within 14 weeks Date Initiated: 04/25/2019 Target Resolution Date: 07/15/2019 Goal Status: Active Interventions: Assess patient/caregiver ability to obtain necessary supplies Assess patient/caregiver ability to perform ulcer/skin care regimen upon admission and as needed Assess ulceration(s) every visit Notes: Electronic Signature(s) Signed: 05/09/2019 4:57:37 PM By: Curtis Sitesorthy, Joanna Entered By: Curtis Sitesorthy, Joanna on 05/09/2019 11:35:44 Darrell FairWALLER, Darrell C. ([id number]) -------------------------------------------------------------------------------- Pain Assessment Details Patient Name: Darrell FairWALLER, Darrell C. Date of Service: 05/09/2019 11:00 AM Medical Record Number: [id number] Patient Account Number: [id number] Date of Birth/Sex: [birth year] (78 y.o. M) Treating RN: Arnette NorrisBiell, Kristina Primary Care Leighana Neyman: Hinda LenisEILLY, SHARON Other Clinician: Referring Shatira Dobosz: Hinda LenisEILLY, SHARON Treating Rhian Asebedo/Extender: Linwood DibblesSTONE III, HOYT Weeks in Treatment: 2 Active Problems Location of Pain Severity and Description of Pain Patient Has Paino No Site Locations Pain Management and Medication Current Pain Management: Electronic Signature(s) Signed: 05/09/2019 4:24:18 PM By: Arnette NorrisBiell, Kristina Entered By: Arnette NorrisBiell, Kristina on 05/09/2019  11:15:09 Darrell FairWALLER, Darrell C. ([id number]) -------------------------------------------------------------------------------- Patient/Caregiver Education Details Patient Name: Darrell FairWALLER, Darrell C. Date of Service: 05/09/2019 11:00 AM Medical Record Number: [id number] Patient Account Number: [id number] Date of Birth/Gender: [birth year] (78 y.o. M) Treating RN: Curtis Sitesorthy, Joanna Primary Care Physician: Hinda LenisEILLY, SHARON Other Clinician: Referring Physician: Hinda LenisEILLY, SHARON Treating Physician/Extender: Skeet SimmerSTONE III, HOYT Weeks in Treatment: 2 Education Assessment Education Provided To: Patient and Caregiver Education Topics Provided Wound/Skin Impairment: Handouts: Other: wound care as ordered Methods: Demonstration, Explain/Verbal Responses: State content correctly Electronic Signature(s) Signed: 05/09/2019 4:57:37 PM By: Curtis Sitesorthy, Joanna Entered By: Curtis Sitesorthy, Joanna on 05/09/2019 11:51:12 Darrell FairWALLER, Darrell C. ([id number]) -------------------------------------------------------------------------------- Wound Assessment Details Patient Name: Darrell FairWALLER, Darrell C. Date of Service: 05/09/2019 11:00 AM Medical Record Number: [id number] Patient Account Number: [id number] Date of Birth/Sex: [birth year] (78 y.o. M) Treating RN: Arnette NorrisBiell, Kristina Primary Care Shail Urbas: Hinda LenisEILLY, SHARON Other Clinician: Referring Daisia Slomski: Hinda LenisEILLY, SHARON Treating Kamiyah Kindel/Extender: STONE III, HOYT Weeks in Treatment: 2 Wound Status Wound Number: 1 Primary Diabetic Wound/Ulcer of the Lower Extremity Etiology: Wound Location:  Right Knee - Medial Wound Open Wounding Event: Trauma Status: Date Acquired: 12/05/2018 Comorbid Arrhythmia, Coronary Artery Disease, Weeks Of Treatment: 2 History: Hypertension, Hepatitis B, Type II Diabetes Clustered Wound: No Photos Wound Measurements Length: (cm) 0.7 % Reduction i Width: (cm) 1 % Reduction i Depth: (cm) 0.1 Epithelializa Area: (cm) 0.55 Tunneling: Volume: (cm) 0.055 Undermining: n Area: 50% n  Volume: 50% tion: None No No Wound Description Classification: Grade 1 Foul Odor Af Wound Margin: Flat and Intact Slough/Fibri Exudate Amount: Small Exudate Type: Serosanguineous Exudate Color: red, brown ter Cleansing: No no Yes Wound Bed Granulation Amount: Medium (34-66%) Exposed Structure Granulation Quality: Red Fascia Exposed: No Necrotic Amount: Small (1-33%) Fat Layer (Subcutaneous Tissue) Exposed: Yes Necrotic Quality: Adherent Slough Tendon Exposed: No Muscle Exposed: No Joint Exposed: No Bone Exposed: No Treatment Notes Darrell Hayes, CUSH. ([id number]) Wound #1 (Right, Medial Knee) Notes adaptic, hydrofera blue, BFD Electronic Signature(s) Signed: 05/09/2019 4:24:18 PM By: Arnette Norris Entered By: Arnette Norris on 05/09/2019 11:25:36 Darrell Hayes ([id number]) -------------------------------------------------------------------------------- Vitals Details Patient Name: Darrell Hayes Date of Service: 05/09/2019 11:00 AM Medical Record Number: [id number] Patient Account Number: [id number] Date of Birth/Sex: 1940-11-04 (78 y.o. M) Treating RN: Arnette Norris Primary Care Nixie Laube: Hinda Lenis Other Clinician: Referring Lastacia Solum: Hinda Lenis Treating Mettie Roylance/Extender: Linwood Dibbles, HOYT Weeks in Treatment: 2 Vital Signs Time Taken: 11:20 Temperature (F): 97.8 Height (in): 69 Pulse (bpm): 67 Source: Stated Respiratory Rate (breaths/min): 18 Weight (lbs): 190 Blood Pressure (mmHg): 109/54 Source: Stated Reference Range: 80 - 120 mg / dl Body Mass Index (BMI): 28.1 Electronic Signature(s) Signed: 05/09/2019 4:24:18 PM By: Arnette Norris Entered By: Arnette Norris on 05/09/2019 11:21:53

## 2019-05-16 ENCOUNTER — Other Ambulatory Visit: Payer: Self-pay

## 2019-05-16 ENCOUNTER — Encounter: Payer: Medicare (Managed Care) | Admitting: Physician Assistant

## 2019-05-16 DIAGNOSIS — E11622 Type 2 diabetes mellitus with other skin ulcer: Secondary | ICD-10-CM | POA: Diagnosis not present

## 2019-05-16 NOTE — Progress Notes (Addendum)
Darrell Hayes ([id number]) Visit Report for 05/16/2019 Chief Complaint Document Details Patient Name: Darrell Hayes Date of Service: 05/16/2019 2:45 PM Medical Record Number: [id number] Patient Account Number: [id number] Date of Birth/Sex: [birth year] (78 y.o. M) Treating RN: Montey Hora Primary Care Provider: Durenda Guthrie Other Clinician: Referring Provider: Durenda Guthrie Treating Provider/Extender: Melburn Hake, HOYT Weeks in Treatment: 3 Information Obtained from: Patient Chief Complaint Right knee ulcer Electronic Signature(s) Signed: 05/16/2019 3:17:23 PM By: Worthy Keeler PA-C Entered By: Worthy Keeler on 05/16/2019 15:17:23 Darrell Hayes ([id number]) -------------------------------------------------------------------------------- HPI Details Patient Name: Darrell Hayes Date of Service: 05/16/2019 2:45 PM Medical Record Number: [id number] Patient Account Number: [id number] Date of Birth/Sex: Nov 10, 1940 (78 y.o. M) Treating RN: Montey Hora Primary Care Provider: Durenda Guthrie Other Clinician: Referring Provider: Durenda Guthrie Treating Provider/Extender: Melburn Hake, HOYT Weeks in Treatment: 3 History of Present Illness HPI Description: 04/25/2019 on evaluation today patient presents for initial inspection here in our clinic concerning issues that he has been having with his right knee he sustained a fall in June 2020 when he fell in the bedroom. 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 Subsequently he is weak and does tend to fall secondary to this. Fortunately there is no signs of active infection at this time which is good news they have been using initially Medihoney for the treatment and then subsequently transition to Xeroform. I believe the biggest issue right now may be that despite the fact they have been using some silver nitrate on the area it has not been enough to completely pare down and get the hyper granulation tissue  under control especially when moisture adding dressing such as Medihoney or Xeroform gauze were added over top of this. Nonetheless the patient is on Coumadin therefore he is likely to bleed we got to be cognizant of this nonetheless he really does need some debridement to clear away the hyper granular tissue to allow this to heal more appropriately. No fevers, chills, nausea, vomiting, or diarrhea. The patient does have a history of hypertension, paroxysmal atrial fibrillation for which she is on long-term anticoagulant therapy, and diabetes mellitus type 2 although he tells me even though this is in his chart that he is considered "prediabetic" I do not believe he is on any medications at this point for diabetes. 05/09/2019 on evaluation today patient's knee ulcer actually appears to be doing quite well. He has a lot of healing noted and in fact the original wound that I remember seen actually seems to be healed today. With that being said he is having an issue right now with some of the irritation surrounding the actual wound opening where he has some skin breakdown I think the dressing has been sticking to this region unfortunately. We may want to try a contact layer underneath the [facility] and see if that will help these final areas heal up. 05/16/2019 on evaluation today patient appears to be doing very well with regard to his wound. In fact this appears to be completely healed and overall I am very pleased with the progress he has made. This is still an area that somewhat fragile and prone to reopening especially in the first weeks following closure. For that reason I discussed with the patient and his daughter today that likely he needs to be very careful in regard to this wound keeping it protected for the next 2 weeks. Fortunately there is no signs of active infection at this time.  Electronic Signature(s) Signed: 05/16/2019 5:27:34 PM By: Lenda Kelp PA-C Entered By: Lenda Kelp on 05/16/2019 17:27:34 Darrell Hayes ([id number]) -------------------------------------------------------------------------------- Physical Exam Details Patient Name: Darrell Hayes Date of Service: 05/16/2019 2:45 PM Medical Record Number: [id number] Patient Account Number: [id number] Date of Birth/Sex: 20-Jun-1941 (78 y.o. M) Treating RN: Curtis Sites Primary Care Provider: Hinda Lenis Other Clinician: Referring Provider: Hinda Lenis Treating Provider/Extender: STONE III, HOYT Weeks in Treatment: 3 Constitutional Well-nourished and well-hydrated in no acute distress. Respiratory normal breathing without difficulty. Psychiatric this patient is able to make decisions and demonstrates good insight into disease process. Alert and Oriented x 3. pleasant and cooperative. Notes Patient's wound bed currently showed signs of good granulation there is no signs of active infection and overall I am very pleased with how things seem to be progressing. No fevers, chills, nausea, vomiting, or diarrhea. Electronic Signature(s) Signed: 05/16/2019 5:28:56 PM By: Lenda Kelp PA-C Entered By: Lenda Kelp on 05/16/2019 17:28:56 Darrell Hayes ([id number]) -------------------------------------------------------------------------------- Physician Orders Details Patient Name: Darrell Hayes Date of Service: 05/16/2019 2:45 PM Medical Record Number: [id number] Patient Account Number: [id number] Date of Birth/Sex: [birth year] (78 y.o. M) Treating RN: Curtis Sites Primary Care Provider: Hinda Lenis Other Clinician: Referring Provider: Hinda Lenis Treating Provider/Extender: Linwood Dibbles, HOYT Weeks in Treatment: 3 Verbal / Phone Orders: No Diagnosis Coding ICD-10 Coding Code Description E11.622 Type 2 diabetes mellitus with other skin ulcer L97.812 Non-pressure chronic ulcer of other part of right lower leg with fat layer exposed I10 Essential (primary)  hypertension I48.0 Paroxysmal atrial fibrillation Z79.01 Long term (current) use of anticoagulants Discharge From [facility] o Discharge from [facility] Center - Please protect this area for the next couple of weeks Electronic Signature(s) Signed: 05/16/2019 5:03:01 PM By: Curtis Sites Signed: 05/16/2019 5:47:19 PM By: Lenda Kelp PA-C Entered By: Curtis Sites on 05/16/2019 15:36:18 Darrell Hayes ([id number]) -------------------------------------------------------------------------------- Problem List Details Patient Name: Darrell Hayes Date of Service: 05/16/2019 2:45 PM Medical Record Number: [id number] Patient Account Number: [id number] Date of Birth/Sex: [birth year] (78 y.o. M) Treating RN: Curtis Sites Primary Care Provider: Hinda Lenis Other Clinician: Referring Provider: Hinda Lenis Treating Provider/Extender: Skeet Simmer in Treatment: 3 Active Problems ICD-10 Evaluated Encounter Code Description Active Date Today Diagnosis E11.622 Type 2 diabetes mellitus with other skin ulcer 04/25/2019 No Yes L97.812 Non-pressure chronic ulcer of other part of right lower leg 04/25/2019 No Yes with fat layer exposed I10 Essential (primary) hypertension 04/25/2019 No Yes I48.0 Paroxysmal atrial fibrillation 04/25/2019 No Yes Z79.01 Long term (current) use of anticoagulants 04/25/2019 No Yes Inactive Problems Resolved Problems Electronic Signature(s) Signed: 05/16/2019 3:17:17 PM By: Lenda Kelp PA-C Entered By: Lenda Kelp on 05/16/2019 15:17:16 Darrell Hayes ([id number]) -------------------------------------------------------------------------------- Progress Note Details Patient Name: Darrell Hayes Date of Service: 05/16/2019 2:45 PM Medical Record Number: [id number] Patient Account Number: [id number] Date of Birth/Sex: July 22, 1940 (78 y.o. M) Treating RN: Curtis Sites Primary Care Provider: Hinda Lenis Other Clinician: Referring  Provider: Hinda Lenis Treating Provider/Extender: Linwood Dibbles, HOYT Weeks in Treatment: 3 Subjective Chief Complaint Information obtained from Patient Right knee ulcer History of Present Illness (HPI) 04/25/2019 on evaluation today patient presents for initial inspection here in our clinic concerning issues that he has been having with his right knee he sustained a fall in June 2020 when he fell in the bedroom. 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 Subsequently he is weak and does  tend to fall secondary to this. Fortunately there is no signs of active infection at this time which is good news they have been using initially Medihoney for the treatment and then subsequently transition to Xeroform. I believe the biggest issue right now may be that despite the fact they have been using some silver nitrate on the area it has not been enough to completely pare down and get the hyper granulation tissue under control especially when moisture adding dressing such as Medihoney or Xeroform gauze were added over top of this. Nonetheless the patient is on Coumadin therefore he is likely to bleed we got to be cognizant of this nonetheless he really does need some debridement to clear away the hyper granular tissue to allow this to heal more appropriately. No fevers, chills, nausea, vomiting, or diarrhea. The patient does have a history of hypertension, paroxysmal atrial fibrillation for which she is on long- term anticoagulant therapy, and diabetes mellitus type 2 although he tells me even though this is in his chart that he is considered "prediabetic" I do not believe he is on any medications at this point for diabetes. 05/09/2019 on evaluation today patient's knee ulcer actually appears to be doing quite well. He has a lot of healing noted and in fact the original wound that I remember seen actually seems to be healed today. With that being said he is having an issue right now with  some of the irritation surrounding the actual wound opening where he has some skin breakdown I think the dressing has been sticking to this region unfortunately. We may want to try a [facility] and see if that will help these final areas heal up. 05/16/2019 on evaluation today patient appears to be doing very well with regard to his wound. In fact this appears to be completely healed and overall I am very pleased with the progress he has made. This is still an area that somewhat fragile and prone to reopening especially in the first weeks following closure. For that reason I discussed with the patient and his daughter today that likely he needs to be very careful in regard to this wound keeping it protected for the next 2 weeks. Fortunately there is no signs of active infection at this time. Patient History Information obtained from Patient. Family History Heart Disease - Mother, Stroke - Mother, No family history of Cancer, Diabetes, Hereditary Spherocytosis, Hypertension, Kidney Disease, Lung Disease, Seizures, Thyroid Problems, Tuberculosis. Social History Former smoker - ended on 07/06/2006, Marital Status - Divorced, Alcohol Use - Never, Drug Use - No History, Caffeine Use - Daily - coffee tea. Medical History Eyes Denies history of Cataracts, Glaucoma, Optic Neuritis Darrell FairWALLER, Darrell C. ([id number]) Ear/Nose/Mouth/Throat Denies history of Chronic sinus problems/congestion, Middle ear problems Hematologic/Lymphatic Denies history of Anemia, Hemophilia, Human Immunodeficiency Virus, Lymphedema, Sickle Cell Disease Respiratory Denies history of Aspiration, Asthma, Chronic Obstructive Pulmonary Disease (COPD), Pneumothorax, Sleep Apnea, Tuberculosis Cardiovascular Patient has history of Arrhythmia - Afib, Coronary Artery Disease, Hypertension Denies history of Congestive Heart Failure, Deep Vein Thrombosis, Hypotension, Myocardial Infarction, Peripheral  Arterial Disease, Peripheral Venous Disease, Phlebitis, Vasculitis Gastrointestinal Patient has history of Hepatitis B Denies history of Cirrhosis , Colitis, Crohn s, Hepatitis A, Hepatitis C Endocrine Patient has history of Type II Diabetes Denies history of Type I Diabetes Genitourinary Denies history of End Stage Renal Disease Immunological Denies history of Lupus Erythematosus, Raynaud s, Scleroderma Integumentary (Skin) Denies history of History of Burn, History of pressure wounds  Musculoskeletal Denies history of Gout, Rheumatoid Arthritis, Osteoarthritis, Osteomyelitis Neurologic Denies history of Dementia, Neuropathy, Quadriplegia, Paraplegia, Seizure Disorder Oncologic Denies history of Received Chemotherapy, Received Radiation Psychiatric Denies history of Anorexia/bulimia, Confinement Anxiety Review of Systems (ROS) Constitutional Symptoms (General Health) Denies complaints or symptoms of Fatigue, Fever, Chills, Marked Weight Change. Respiratory Denies complaints or symptoms of Chronic or frequent coughs, Shortness of Breath. Cardiovascular Denies complaints or symptoms of Chest pain, LE edema. Psychiatric Denies complaints or symptoms of Anxiety, Claustrophobia. Objective Constitutional Well-nourished and well-hydrated in no acute distress. Vitals Time Taken: 3:00 PM, Height: 69 in, Weight: 190 lbs, BMI: 28.1, Temperature: 98.6 F, Pulse: 67 bpm, Respiratory Rate: 16 breaths/min, Blood Pressure: 119/57 mmHg. Respiratory Darrell Hayes, Darrell C. ([id number]) normal breathing without difficulty. Psychiatric this patient is able to make decisions and demonstrates good insight into disease process. Alert and Oriented x 3. pleasant and cooperative. General Notes: Patient's wound bed currently showed signs of good granulation there is no signs of active infection and overall I am very pleased with how things seem to be progressing. No fevers, chills, nausea, vomiting, or  diarrhea. Integumentary (Hair, Skin) Wound #1 status is Healed - Epithelialized. Original cause of wound was Trauma. The wound is located on the Right,Medial Knee. The wound measures 0cm length x 0cm width x 0cm depth; 0cm^2 area and 0cm^3 volume. There is Fat Layer (Subcutaneous Tissue) Exposed exposed. There is no tunneling or undermining noted. There is a small amount of serosanguineous drainage noted. The wound margin is flat and intact. There is large (67-100%) red granulation within the wound bed. There is a small (1-33%) amount of necrotic tissue within the wound bed including Eschar. Assessment Active Problems ICD-10 Type 2 diabetes mellitus with other skin ulcer Non-pressure chronic ulcer of other part of right lower leg with fat layer exposed Essential (primary) hypertension Paroxysmal atrial fibrillation Long term (current) use of anticoagulants Plan Discharge From [facility]: Discharge from Wound Care Center - Please protect this area for the next couple of weeks 1. My suggestion at this time is going to be that we go ahead and initiate treatment with just a protective dressing over the next 1-2 weeks in order to ensure this area does not attempt to reopen and does not get damaged as far as anything hitting on the area. This would likely do well with using an adaptic dressing then cover with an abd pad to be secured with paper tape outside of the fragile skin region. 2. As far as activity other than that I feel like the patient can be active is much as he wants without any risk or complication. We will see the patient back for a follow-up visit as needed. Electronic Signature(s) Signed: 05/18/2019 9:25:33 AM By: Lenda Kelp PA-C Previous Signature: 05/16/2019 5:30:24 PM Version By: Lenda Kelp PA-C Entered By: Lenda Kelp on 05/18/2019 09:25:33 Darrell Hayes ([id number] Hayes  ([id number]) -------------------------------------------------------------------------------- ROS/PFSH Details Patient Name: Darrell Hayes Date of Service: 05/16/2019 2:45 PM Medical Record Number: [id number] Patient Account Number: [id number] Date of Birth/Sex: [birth year] (78 y.o. M) Treating RN: Curtis Sites Primary Care Provider: Hinda Lenis Other Clinician: Referring Provider: Hinda Lenis Treating Provider/Extender: Linwood Dibbles, HOYT Weeks in Treatment: 3 Information Obtained From Patient Constitutional Symptoms (General Health) Complaints and Symptoms: Negative for: Fatigue; Fever; Chills; Marked Weight Change Respiratory Complaints and Symptoms: Negative for: Chronic or frequent coughs; Shortness of Breath Medical History: Negative for: Aspiration; Asthma; Chronic Obstructive Pulmonary Disease (COPD); Pneumothorax; Sleep  Apnea; Tuberculosis Cardiovascular Complaints and Symptoms: Negative for: Chest pain; LE edema Medical History: Positive for: Arrhythmia - Afib; Coronary Artery Disease; Hypertension Negative for: Congestive Heart Failure; Deep Vein Thrombosis; Hypotension; Myocardial Infarction; Peripheral Arterial Disease; Peripheral Venous Disease; Phlebitis; Vasculitis Psychiatric Complaints and Symptoms: Negative for: Anxiety; Claustrophobia Medical History: Negative for: Anorexia/bulimia; Confinement Anxiety Eyes Medical History: Negative for: Cataracts; Glaucoma; Optic Neuritis Ear/Nose/Mouth/Throat Medical History: Negative for: Chronic sinus problems/congestion; Middle ear problems Hematologic/Lymphatic Medical History: Negative for: Anemia; Hemophilia; Human Immunodeficiency Virus; Lymphedema; Sickle Cell Disease Darrell Hayes, Darrell Hayes ([id number]) Gastrointestinal Medical History: Positive for: Hepatitis B Negative for: Cirrhosis ; Colitis; Crohnos; Hepatitis A; Hepatitis C Endocrine Medical History: Positive for: Type II Diabetes Negative for: Type  I Diabetes Treated with: Diet Blood sugar tested every day: No Genitourinary Medical History: Negative for: End Stage Renal Disease Immunological Medical History: Negative for: Lupus Erythematosus; Raynaudos; Scleroderma Integumentary (Skin) Medical History: Negative for: History of Burn; History of pressure wounds Musculoskeletal Medical History: Negative for: Gout; Rheumatoid Arthritis; Osteoarthritis; Osteomyelitis Neurologic Medical History: Negative for: Dementia; Neuropathy; Quadriplegia; Paraplegia; Seizure Disorder Oncologic Medical History: Negative for: Received Chemotherapy; Received Radiation Immunizations Pneumococcal Vaccine: Received Pneumococcal Vaccination: No Implantable Devices None Family and Social History Cancer: No; Diabetes: No; Heart Disease: Yes - Mother; Hereditary Spherocytosis: No; Hypertension: No; Kidney Disease: No; Lung Disease: No; Seizures: No; Stroke: Yes - Mother; Thyroid Problems: No; Tuberculosis: No; Former smoker - ended on 07/06/2006; Marital Status - Divorced; Alcohol Use: Never; Drug Use: No History; Caffeine Use: Daily - coffee tea; Financial Concerns: No; Food, Clothing or Shelter Needs: No; Support System Lacking: No; Transportation Concerns: No Physician Affirmation I have reviewed and agree with the above information. Darrell Hayes, Darrell Hayes ([id number]) Electronic Signature(s) Signed: 05/16/2019 5:47:19 PM By: Lenda Kelp PA-C Signed: 05/17/2019 5:04:33 PM By: Curtis Sites Entered By: Lenda Kelp on 05/16/2019 17:28:06 Darrell Hayes ([id number]) -------------------------------------------------------------------------------- SuperBill Details Patient Name: Darrell Hayes Date of Service: 05/16/2019 Medical Record Number: [id number] Patient Account Number: [id number] Date of Birth/Sex: 1941/02/18 (78 y.o. M) Treating RN: Curtis Sites Primary Care Provider: Hinda Lenis Other Clinician: Referring Provider: Hinda Lenis Treating Provider/Extender: Linwood Dibbles, HOYT Weeks in Treatment: 3 Diagnosis Coding ICD-10 Codes Code Description E11.622 Type 2 diabetes mellitus with other skin ulcer L97.812 Non-pressure chronic ulcer of other part of right lower leg with fat layer exposed I10 Essential (primary) hypertension I48.0 Paroxysmal atrial fibrillation Z79.01 Long term (current) use of anticoagulants Facility Procedures CPT4 Code: [id number] Description: 60109 - WOUND CARE VISIT-LEV 2 EST PT Modifier: Quantity: 1 Physician Procedures CPT4 Code Description: [id number] - WC PHYS LEVEL 3 - EST PT ICD-10 Diagnosis Description E11.622 Type 2 diabetes mellitus with other skin ulcer L97.812 Non-pressure chronic ulcer of other part of right lower leg w I10 Essential (primary) hypertension  I48.0 Paroxysmal atrial fibrillation Modifier: ith fat layer expo Quantity: 1 sed Electronic Signature(s) Signed: 05/16/2019 5:30:38 PM By: Lenda Kelp PA-C Entered By: Lenda Kelp on 05/16/2019 17:30:38

## 2019-05-18 NOTE — Progress Notes (Signed)
TYRUS, WILMS ([id number]) Visit Report for 05/16/2019 Arrival Information Details Patient Name: Darrell Hayes, Darrell Hayes Date of Service: 05/16/2019 2:45 PM Medical Record Number: [id number] Patient Account Number: [id number] Date of Birth/Sex: [birth year] (78 y.o. M) Treating RN: Montey Hora Primary Care Careen Mauch: Durenda Guthrie Other Clinician: Referring Danine Hor: Durenda Guthrie Treating Payden Docter/Extender: Melburn Hake, HOYT Weeks in Treatment: 3 Visit Information History Since Last Visit Added or deleted any medications: No Patient Arrived: Wheel Chair Any new allergies or adverse reactions: No Arrival Time: 14:59 Had a fall or experienced change in No Accompanied By: daughter activities of daily living that may affect Transfer Assistance: None risk of falls: Patient Identification Verified: Yes Signs or symptoms of abuse/neglect since last visito No Secondary Verification Process Completed: Yes Hospitalized since last visit: No Implantable device outside of the clinic excluding No cellular tissue based products placed in the center since last visit: Pain Present Now: No Electronic Signature(s) Signed: 05/16/2019 4:25:36 PM By: Lorine Bears RCP, RRT, CHT Entered By: Lorine Bears on 05/16/2019 15:00:59 Darrell Hayes ([id number]) -------------------------------------------------------------------------------- Clinic Level of Care Assessment Details Patient Name: Darrell Hayes Date of Service: 05/16/2019 2:45 PM Medical Record Number: [id number] Patient Account Number: [id number] Date of Birth/Sex: September 27, 1940 (78 y.o. M) Treating RN: Montey Hora Primary Care Kaleigha Chamberlin: Durenda Guthrie Other Clinician: Referring Dezra Mandella: Durenda Guthrie Treating Terryon Pineiro/Extender: Melburn Hake, HOYT Weeks in Treatment: 3 Clinic Level of Care Assessment Items TOOL 4 Quantity Score []  - Use when only an EandM is performed on FOLLOW-UP visit 0 ASSESSMENTS - Nursing  Assessment / Reassessment X - Reassessment of Co-morbidities (includes updates in patient status) 1 10 X- 1 5 Reassessment of Adherence to Treatment Plan ASSESSMENTS - Wound and Skin Assessment / Reassessment X - Simple Wound Assessment / Reassessment - one wound 1 5 []  - 0 Complex Wound Assessment / Reassessment - multiple wounds []  - 0 Dermatologic / Skin Assessment (not related to wound area) ASSESSMENTS - Focused Assessment []  - Circumferential Edema Measurements - multi extremities 0 []  - 0 Nutritional Assessment / Counseling / Intervention []  - 0 Lower Extremity Assessment (monofilament, tuning fork, pulses) []  - 0 Peripheral Arterial Disease Assessment (using hand held doppler) ASSESSMENTS - Ostomy and/or Continence Assessment and Care []  - Incontinence Assessment and Management 0 []  - 0 Ostomy Care Assessment and Management (repouching, etc.) PROCESS - Coordination of Care X - Simple Patient / Family Education for ongoing care 1 15 []  - 0 Complex (extensive) Patient / Family Education for ongoing care X- 1 10 Staff obtains Programmer, systems, Records, Test Results / Process Orders []  - 0 Staff telephones HHA, Nursing Homes / Clarify orders / etc []  - 0 Routine Transfer to another Facility (non-emergent condition) []  - 0 Routine Hospital Admission (non-emergent condition) []  - 0 New Admissions / Biomedical engineer / Ordering NPWT, Apligraf, etc. []  - 0 Emergency Hospital Admission (emergent condition) X- 1 10 Simple Discharge Coordination Darrell Hayes ([id number]) []  - 0 Complex (extensive) Discharge Coordination PROCESS - Special Needs []  - Pediatric / Minor Patient Management 0 []  - 0 Isolation Patient Management []  - 0 Hearing / Language / Visual special needs []  - 0 Assessment of Community assistance (transportation, D/C planning, etc.) []  - 0 Additional assistance / Altered mentation []  - 0 Support Surface(s) Assessment (bed, cushion, seat,  etc.) INTERVENTIONS - Wound Cleansing / Measurement X - Simple Wound Cleansing - one wound 1 5 []  - 0 Complex Wound Cleansing - multiple wounds X- 1 5 Wound Imaging (photographs -  any number of wounds) []  - 0 Wound Tracing (instead of photographs) X- 1 5 Simple Wound Measurement - one wound []  - 0 Complex Wound Measurement - multiple wounds INTERVENTIONS - Wound Dressings []  - Small Wound Dressing one or multiple wounds 0 []  - 0 Medium Wound Dressing one or multiple wounds []  - 0 Large Wound Dressing one or multiple wounds []  - 0 Application of Medications - topical []  - 0 Application of Medications - injection INTERVENTIONS - Miscellaneous []  - External ear exam 0 []  - 0 Specimen Collection (cultures, biopsies, blood, body fluids, etc.) []  - 0 Specimen(s) / Culture(s) sent or taken to Lab for analysis []  - 0 Patient Transfer (multiple staff / Nurse, adultHoyer Lift / Similar devices) []  - 0 Simple Staple / Suture removal (25 or less) []  - 0 Complex Staple / Suture removal (26 or more) []  - 0 Hypo / Hyperglycemic Management (close monitor of Blood Glucose) []  - 0 Ankle / Brachial Index (ABI) - do not check if billed separately X- 1 5 Vital Signs Alderman, Tiffany C. ([id number]) Has the patient been seen at the hospital within the last three years: Yes Total Score: 75 Level Of Care: New/Established - Level 2 Electronic Signature(s) Signed: 05/16/2019 5:03:01 PM By: Curtis Sitesorthy, Joanna Entered By: Curtis Sitesorthy, Joanna on 05/16/2019 15:42:44 Darrell FairWALLER, Darrell C. ([id number]) -------------------------------------------------------------------------------- Encounter Discharge Information Details Patient Name: Darrell FairWALLER, Darrell C. Date of Service: 05/16/2019 2:45 PM Medical Record Number: [id number] Patient Account Number: [id number] Date of Birth/Sex: [birth year] (78 y.o. M) Treating RN: Curtis Sitesorthy, Joanna Primary Care Kierre Hintz: Hinda LenisEILLY, SHARON Other Clinician: Referring Dulse Rutan: Hinda LenisEILLY, SHARON Treating  Terree Gaultney/Extender: Linwood DibblesSTONE III, HOYT Weeks in Treatment: 3 Encounter Discharge Information Items Discharge Condition: Stable Ambulatory Status: Wheelchair Discharge Destination: Home Transportation: Private Auto Accompanied By: daughter Schedule Follow-up Appointment: No Clinical Summary of Care: Electronic Signature(s) Signed: 05/16/2019 3:43:36 PM By: Curtis Sitesorthy, Joanna Entered By: Curtis Sitesorthy, Joanna on 05/16/2019 15:43:35 Darrell FairWALLER, Darrell C. ([id number]) -------------------------------------------------------------------------------- Lower Extremity Assessment Details Patient Name: Darrell FairWALLER, Darrell C. Date of Service: 05/16/2019 2:45 PM Medical Record Number: [id number] Patient Account Number: [id number] Date of Birth/Sex: [birth year] (78 y.o. M) Treating RN: Rodell PernaScott, Dajea Primary Care Reeve Mallo: Hinda LenisEILLY, SHARON Other Clinician: Referring Surie Suchocki: Hinda LenisEILLY, SHARON Treating Jeananne Bedwell/Extender: STONE III, HOYT Weeks in Treatment: 3 Edema Assessment Assessed: [Left: No] [Right: No] Edema: [Left: N] [Right: o] Vascular Assessment Pulses: Dorsalis Pedis Palpable: [Right:Yes] Electronic Signature(s) Signed: 05/18/2019 3:26:34 PM By: Rodell PernaScott, Dajea Entered By: Rodell PernaScott, Dajea on 05/16/2019 15:03:24 Darrell FairWALLER, Darrell C. ([id number]) -------------------------------------------------------------------------------- Multi-Disciplinary Care Plan Details Patient Name: Darrell FairWALLER, Darrell C. Date of Service: 05/16/2019 2:45 PM Medical Record Number: [id number] Patient Account Number: [id number] Date of Birth/Sex: [birth year] (78 y.o. M) Treating RN: Curtis Sitesorthy, Joanna Primary Care Bert Givans: Hinda LenisEILLY, SHARON Other Clinician: Referring Shandon Matson: Hinda LenisEILLY, SHARON Treating Johnjoseph Rolfe/Extender: Linwood DibblesSTONE III, HOYT Weeks in Treatment: 3 Active Inactive Electronic Signature(s) Signed: 05/16/2019 5:03:01 PM By: Curtis Sitesorthy, Joanna Entered By: Curtis Sitesorthy, Joanna on 05/16/2019 15:35:36 Darrell FairWALLER, Darrell C.  ([id number]) -------------------------------------------------------------------------------- Pain Assessment Details Patient Name: Darrell FairWALLER, Darrell C. Date of Service: 05/16/2019 2:45 PM Medical Record Number: [id number] Patient Account Number: [id number] Date of Birth/Sex: [birth year] (78 y.o. M) Treating RN: Curtis Sitesorthy, Joanna Primary Care Urvi Imes: Hinda LenisEILLY, SHARON Other Clinician: Referring Jovani Colquhoun: Hinda LenisEILLY, SHARON Treating Rashelle Ireland/Extender: Linwood DibblesSTONE III, HOYT Weeks in Treatment: 3 Active Problems Location of Pain Severity and Description of Pain Patient Has Paino No Site Locations Pain Management and Medication Current Pain Management: Electronic Signature(s) Signed: 05/16/2019 4:25:36 PM By: Sallee ProvencalWallace, RCP,RRT,CHT, Sallie RCP, RRT, CHT Signed: 05/16/2019 5:03:01 PM By: Curtis Sitesorthy, Joanna  Entered By: Dayton Martes on 05/16/2019 15:01:06 Darrell Hayes ([id number]) -------------------------------------------------------------------------------- Patient/Caregiver Education Details Patient Name: Darrell Hayes Date of Service: 05/16/2019 2:45 PM Medical Record Number: [id number] Patient Account Number: [id number] Date of Birth/Gender: [birth year] (78 y.o. M) Treating RN: Curtis Sites Primary Care Physician: Hinda Lenis Other Clinician: Referring Physician: Hinda Lenis Treating Physician/Extender: Skeet Simmer in Treatment: 3 Education Assessment Education Provided To: Patient and Caregiver Education Topics Provided Basic Hygiene: Handouts: Other: care of newly healed wound site Methods: Explain/Verbal Responses: State content correctly Electronic Signature(s) Signed: 05/16/2019 5:03:01 PM By: Curtis Sites Entered By: Curtis Sites on 05/16/2019 15:43:07 Darrell Hayes ([id number]) -------------------------------------------------------------------------------- Wound Assessment Details Patient Name: Darrell Hayes Date of Service: 05/16/2019 2:45  PM Medical Record Number: [id number] Patient Account Number: [id number] Date of Birth/Sex: Oct 28, 1940 (78 y.o. M) Treating RN: Curtis Sites Primary Care Jumar Greenstreet: Hinda Lenis Other Clinician: Referring Eisha Chatterjee: Hinda Lenis Treating Luka Stohr/Extender: STONE III, HOYT Weeks in Treatment: 3 Wound Status Wound Number: 1 Primary Diabetic Wound/Ulcer of the Lower Extremity Etiology: Wound Location: Right, Medial Knee Wound Healed - Epithelialized Wounding Event: Trauma Status: Date Acquired: 12/05/2018 Comorbid Arrhythmia, Coronary Artery Disease, Weeks Of Treatment: 3 History: Hypertension, Hepatitis B, Type II Diabetes Clustered Wound: No Photos Wound Measurements Length: (cm) 0 % Reductio Width: (cm) 0 % Reductio Depth: (cm) 0 Epithelial Area: (cm) 0 Tunneling Volume: (cm) 0 Undermini n in Area: 100% n in Volume: 100% ization: Large (67-100%) : No ng: No Wound Description Classification: Grade 1 Foul Odor Wound Margin: Flat and Intact Slough/Fi Exudate Amount: Small Exudate Type: Serosanguineous Exudate Color: red, brown After Cleansing: No brino Yes Wound Bed Granulation Amount: Large (67-100%) Exposed Structure Granulation Quality: Red Fascia Exposed: No Necrotic Amount: Small (1-33%) Fat Layer (Subcutaneous Tissue) Exposed: Yes Necrotic Quality: Eschar Tendon Exposed: No Muscle Exposed: No Joint Exposed: No Bone Exposed: No Electronic Signature(s) BOY, DELAMATER ([id number]) Signed: 05/16/2019 5:03:01 PM By: Curtis Sites Entered By: Curtis Sites on 05/16/2019 15:35:19 Darrell Hayes ([id number]) -------------------------------------------------------------------------------- Vitals Details Patient Name: Darrell Hayes Date of Service: 05/16/2019 2:45 PM Medical Record Number: [id number] Patient Account Number: [id number] Date of Birth/Sex: 12/20/1940 (78 y.o. M) Treating RN: Curtis Sites Primary Care Nusrat Encarnacion: Hinda Lenis Other  Clinician: Referring Teyon Odette: Hinda Lenis Treating [place] Dodgen/Extender: Linwood Dibbles, HOYT Weeks in Treatment: 3 Vital Signs Time Taken: 15:00 Temperature (F): 98.6 Height (in): 69 Pulse (bpm): 67 Weight (lbs): 190 Respiratory Rate (breaths/min): 16 Body Mass Index (BMI): 28.1 Blood Pressure (mmHg): 119/57 Reference Range: 80 - 120 mg / dl Electronic Signature(s) Signed: 05/16/2019 4:25:36 PM By: Dayton Martes RCP, RRT, CHT Entered By: Dayton Martes on 05/16/2019 15:01:37

## 2019-05-23 ENCOUNTER — Encounter: Payer: Medicare (Managed Care) | Admitting: Physician Assistant

## 2019-06-05 ENCOUNTER — Telehealth: Payer: Self-pay

## 2019-06-05 NOTE — Telephone Encounter (Signed)
I received a phone call from patient's daughter Joannie [place]. She states that this patient was established with Dr. Damita Dunnings years ago, but then [facility]. Patient's daughter states that they are needing a second opinion, but would also like for patient to establish again with you. Is this ok?  Patient's daughter asked that she be contacted regarding this Leanora Ivanoff - [phone]   Thanks!

## 2019-06-06 NOTE — Telephone Encounter (Signed)
Okay with me.  Needs 61min OV.  Thanks.

## 2019-06-06 NOTE — Telephone Encounter (Signed)
Darrell Hayes scheduled an appointment.

## 2019-06-15 ENCOUNTER — Ambulatory Visit (INDEPENDENT_AMBULATORY_CARE_PROVIDER_SITE_OTHER): Payer: Medicare (Managed Care) | Admitting: Family Medicine

## 2019-06-15 ENCOUNTER — Other Ambulatory Visit: Payer: Self-pay

## 2019-06-15 ENCOUNTER — Encounter: Payer: Self-pay | Admitting: Family Medicine

## 2019-06-15 VITALS — BP 110/56 | HR 62 | Temp 97.7°F | Ht 68.0 in | Wt 203.4 lb

## 2019-06-15 DIAGNOSIS — Z87440 Personal history of urinary (tract) infections: Secondary | ICD-10-CM

## 2019-06-15 DIAGNOSIS — R739 Hyperglycemia, unspecified: Secondary | ICD-10-CM | POA: Diagnosis not present

## 2019-06-15 DIAGNOSIS — Z23 Encounter for immunization: Secondary | ICD-10-CM

## 2019-06-15 DIAGNOSIS — I639 Cerebral infarction, unspecified: Secondary | ICD-10-CM | POA: Diagnosis not present

## 2019-06-15 DIAGNOSIS — I4891 Unspecified atrial fibrillation: Secondary | ICD-10-CM

## 2019-06-15 DIAGNOSIS — N390 Urinary tract infection, site not specified: Secondary | ICD-10-CM | POA: Diagnosis not present

## 2019-06-15 DIAGNOSIS — E1159 Type 2 diabetes mellitus with other circulatory complications: Secondary | ICD-10-CM

## 2019-06-15 MED ORDER — WARFARIN SODIUM 1 MG PO TABS: ORAL_TABLET | ORAL | Status: DC

## 2019-06-15 NOTE — Patient Instructions (Signed)
Flu shot today.  I'll request your records.  Don't change your meds for now.  Go to the lab on the way out.  We'll contact you with your lab report. We'll call about cardiology and urology appointment.  Take care.  Glad to see you.

## 2019-06-15 NOTE — Progress Notes (Signed)
This visit occurred during the SARS-CoV-2 public health emergency.  Safety protocols were in place, including screening questions prior to the visit, additional usage of staff PPE, and extensive cleaning of exam room while observing appropriate contact time as indicated for disinfecting solutions.  Previously seen here >3 years ago.  Here to reestablish care as family is considering transferring from [facility] back to the clinic here.  Cardiac history discussed.  History of A. fib.  Currently anticoagulated.  Discussed getting cardiology referral set up- would prefer Dr. Rockey Situ.  It was not an issue where he had an immediate cardiac concern but it would make sense to see cardiology at some point.  No chest pain.  Hyperglycemia versus possible diabetes noted.  Not on medication currently.  He needs labs done.   History of UTI, recurrent.  Discussed getting urology evaluation done given his history of recurrent UTIs.  On coumadin.  On septra for UTI proph.  Recent INR done and within goal range per family report.  Goal 2-3 for AF.  Total Coumadin dose- 3mg  on 5 days a week.  2mg  on Tues and Sat.   19mg  in a week.    No emergent issues right now but noted that he had speech changes about 3 weeks ago.  It was thought to be secondary to UTI.  He has chronic speech changes at baseline.  He has improved in the meantime.  He does not have any acute new neurologic symptoms.  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  We talked about his home situation.  He is living with family.  He has appropriate hardware in his wheelchair is okay for now.  Status post hospitalization previously where he had cholecystostomy tube.  Tube has been removed in the meantime.  No fevers.  No abdominal pain.  No vomiting.  No jaundice.  PMH and SH reviewed  ROS: Per HPI unless specifically indicated in ROS section   Meds, vitals, and allergies reviewed.   GEN: nad, alert  and oriented HEENT: ncat NECK: supple w/o LA CV: Sounds of a regular rate and rhythm. 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  He is at his typical baseline per family report.

## 2019-06-16 ENCOUNTER — Other Ambulatory Visit: Payer: Self-pay | Admitting: Family Medicine

## 2019-06-16 DIAGNOSIS — Z23 Encounter for immunization: Secondary | ICD-10-CM

## 2019-06-16 LAB — CBC WITH DIFFERENTIAL/PLATELET
Basophils Absolute: 0 10*3/uL (ref 0.0–0.1)
Basophils Relative: 0.4 % (ref 0.0–3.0)
Eosinophils Absolute: 0.1 10*3/uL (ref 0.0–0.7)
Eosinophils Relative: 0.7 % (ref 0.0–5.0)
HCT: 35 % — ABNORMAL LOW (ref 39.0–52.0)
Hemoglobin: 11.7 g/dL — ABNORMAL LOW (ref 13.0–17.0)
Lymphocytes Relative: 29.9 % (ref 12.0–46.0)
Lymphs Abs: 2 10*3/uL (ref 0.7–4.0)
MCHC: 33.5 g/dL (ref 30.0–36.0)
MCV: 94.7 fl (ref 78.0–100.0)
Monocytes Absolute: 0.6 10*3/uL (ref 0.1–1.0)
Monocytes Relative: 8.8 % (ref 3.0–12.0)
Neutro Abs: 4.1 10*3/uL (ref 1.4–7.7)
Neutrophils Relative %: 60.2 % (ref 43.0–77.0)
Platelets: 246 10*3/uL (ref 150.0–400.0)
RBC: 3.69 Mil/uL — ABNORMAL LOW (ref 4.22–5.81)
RDW: 16.5 % — ABNORMAL HIGH (ref 11.5–15.5)
WBC: 6.8 10*3/uL (ref 4.0–10.5)

## 2019-06-16 LAB — COMPREHENSIVE METABOLIC PANEL
ALT: 9 U/L (ref 0–53)
AST: 13 U/L (ref 0–37)
Albumin: 3.8 g/dL (ref 3.5–5.2)
Alkaline Phosphatase: 78 U/L (ref 39–117)
BUN: 20 mg/dL (ref 6–23)
CO2: 27 mEq/L (ref 19–32)
Calcium: 9.5 mg/dL (ref 8.4–10.5)
Chloride: 102 mEq/L (ref 96–112)
Creatinine, Ser: 1.46 mg/dL (ref 0.40–1.50)
GFR: 46.61 mL/min — ABNORMAL LOW (ref >60.00)
Glucose, Bld: 118 mg/dL — ABNORMAL HIGH (ref 70–99)
Potassium: 4.5 mEq/L (ref 3.5–5.1)
Sodium: 136 mEq/L (ref 135–145)
Total Bilirubin: 0.4 mg/dL (ref 0.2–1.2)
Total Protein: 7.6 g/dL (ref 6.0–8.3)

## 2019-06-16 LAB — LIPID PANEL
Cholesterol: 104 mg/dL (ref 0–200)
HDL: 38.3 mg/dL — ABNORMAL LOW (ref >39.00)
LDL Cholesterol: 52 mg/dL (ref 0–99)
NonHDL: 66
Total CHOL/HDL Ratio: 3
Triglycerides: 72 mg/dL (ref 0.0–149.0)
VLDL: 14.4 mg/dL (ref 0.0–40.0)

## 2019-06-16 LAB — TSH: TSH: 3.02 u[IU]/mL (ref 0.35–4.50)

## 2019-06-16 LAB — HEMOGLOBIN A1C: Hgb A1c MFr Bld: 6.7 % — ABNORMAL HIGH (ref 4.6–6.5)

## 2019-06-16 LAB — PROTIME-INR
INR: 1.7 ratio — ABNORMAL HIGH (ref 0.8–1.0)
Prothrombin Time: 19.6 s — ABNORMAL HIGH (ref 9.6–13.1)

## 2019-06-16 MED ORDER — WARFARIN SODIUM 1 MG PO TABS: ORAL_TABLET | ORAL | Status: DC

## 2019-06-19 ENCOUNTER — Telehealth: Payer: Self-pay | Admitting: Family Medicine

## 2019-06-19 ENCOUNTER — Encounter: Payer: Self-pay | Admitting: Family Medicine

## 2019-06-19 DIAGNOSIS — E1159 Type 2 diabetes mellitus with other circulatory complications: Secondary | ICD-10-CM | POA: Insufficient documentation

## 2019-06-19 NOTE — Telephone Encounter (Signed)
Larrelle advised. DPR

## 2019-06-19 NOTE — Telephone Encounter (Signed)
Patient's daughter,Darrell Hayes,returned Darrell Hayes's call. She said she did receive Darrell Hayes's message, but Darrell Hayes mentioned giving her lab results.

## 2019-06-19 NOTE — Assessment & Plan Note (Signed)
See notes on labs regarding A1c.

## 2019-06-19 NOTE — Assessment & Plan Note (Signed)
Currently on antibiotic suppression.  Continue as is for now.  We will recheck INR today given Septra use.  If this is a chronic medication then we still should be able to account for it with his INR.  See notes on labs.  Refer to urology. >30 minutes spent in face to face time with patient, >50% spent in counselling or coordination of care.

## 2019-06-19 NOTE — Assessment & Plan Note (Signed)
History of.  He had more speech changes about 3 weeks ago.  It was thought to be related to concurrent UTI.  He has improved in the meantime.  We talked about options.  I do not think it would make sense to send him back to the emergency room or hospital at this point for repeat imaging, especially given the pandemic.  It likely would not change management.  Patient and family agree.  If he had not had a stroke, then we would not change in medications.  Even if he had a previous/recent embolic stroke, we would likely continue his same medications.  All are in agreement.  He appears to be back at his previous baseline.

## 2019-06-19 NOTE — Assessment & Plan Note (Signed)
History of.  Continue anticoagulation for now.  See discussion of antibiotic prophylaxis.  Refer back to cardiology.

## 2019-06-20 NOTE — Addendum Note (Signed)
Addended by: Josetta Huddle on: 06/20/2019 10:06 AM   Modules accepted: Orders

## 2019-06-22 ENCOUNTER — Telehealth: Payer: Self-pay | Admitting: Family Medicine

## 2019-06-22 NOTE — Telephone Encounter (Signed)
Daughter called stating pt urine still odor going to bathroom ok.  He has finished 10 day antibiotic.   Pt made appointment  12/21 PT/INR

## 2019-06-22 NOTE — Telephone Encounter (Signed)
Please triage patient and see what details you can get. Thanks.  

## 2019-06-22 NOTE — Telephone Encounter (Signed)
Left detailed message on voicemail of [place] DPR.

## 2019-06-22 NOTE — Telephone Encounter (Signed)
Laurelle said pt finished abx on 06/12/19 or 06/13/19; at that time pt still had slight but strong odor to urine; no burning or pain or frequency of urine; no back or abd pain.no fever. The smell of urine is not worse but the urine is still strong smelling. Pt will not drink water; pt drinks unsweetened tea. Laurelle said when she talked with urologist office today they advised they could put pt on a cancellation list for a sooner appt if one came available but [place] said that MWF she could not go to urologist unless very late and it could be difficult for pts son to be at appt also. Both want to be at appt when pt is seen and Laurelle advised to leave the urology appt scheduled as is on 07/27/19 at this time. Laurelle just wanted to see if Dr Damita Dunnings had any further suggestions or instructions. [facility] [address] [place] request cb.

## 2019-06-22 NOTE — Telephone Encounter (Signed)
I wouldn't routinely treat/change abx if no burning or pain or frequency of urine; no back or abd pain, no fever.  I would have them do whatever they can to inc water intake as that may improve/resolve the issue.  If worse in spite of inc water intake, then needs recheck.  We can recheck here if needed.  I would have them f/u with urology when possible.  Thanks.

## 2019-06-23 ENCOUNTER — Other Ambulatory Visit: Payer: Self-pay | Admitting: Family Medicine

## 2019-06-23 DIAGNOSIS — I4891 Unspecified atrial fibrillation: Secondary | ICD-10-CM

## 2019-06-23 NOTE — Telephone Encounter (Signed)
Larrell, sorry for the confusion w/DPR listing.  It is updated now to include you as DPR.  [place], [phone], tried to call to schedule (Dr. Donivan Scull office).  They were not able to leave voicemail as it was not pt's voice or indicated it was pt's vm.  You can call them to schedule cardiology appointment.  If you need to contact me, please call me [phone].

## 2019-06-26 ENCOUNTER — Ambulatory Visit (INDEPENDENT_AMBULATORY_CARE_PROVIDER_SITE_OTHER): Payer: Medicare (Managed Care) | Admitting: Family Medicine

## 2019-06-26 ENCOUNTER — Ambulatory Visit: Payer: Self-pay | Admitting: Family Medicine

## 2019-06-26 ENCOUNTER — Encounter: Payer: Self-pay | Admitting: Family Medicine

## 2019-06-26 ENCOUNTER — Other Ambulatory Visit: Payer: Self-pay

## 2019-06-26 ENCOUNTER — Telehealth: Payer: Self-pay

## 2019-06-26 ENCOUNTER — Other Ambulatory Visit (INDEPENDENT_AMBULATORY_CARE_PROVIDER_SITE_OTHER): Payer: Medicare (Managed Care)

## 2019-06-26 DIAGNOSIS — I10 Essential (primary) hypertension: Secondary | ICD-10-CM | POA: Diagnosis not present

## 2019-06-26 DIAGNOSIS — I4891 Unspecified atrial fibrillation: Secondary | ICD-10-CM | POA: Diagnosis not present

## 2019-06-26 LAB — PROTIME-INR
INR: 2.7 ratio — ABNORMAL HIGH (ref 0.8–1.0)
Prothrombin Time: 30.6 s — ABNORMAL HIGH (ref 9.6–13.1)

## 2019-06-26 MED ORDER — LISINOPRIL 2.5 MG PO TABS: ORAL_TABLET | ORAL | Status: DC

## 2019-06-26 NOTE — Telephone Encounter (Signed)
See OV note.  Thanks.  

## 2019-06-26 NOTE — Telephone Encounter (Signed)
See phone note for today since pt was in office.

## 2019-06-26 NOTE — Patient Instructions (Addendum)
Stop lisinopril for now.  Drink enough water to keep your urine clear or light colored.  Get a small snack between meals.   Update me as needed.   Take care.  Glad to see you.

## 2019-06-26 NOTE — Progress Notes (Signed)
This visit occurred during the SARS-CoV-2 public health emergency.  Safety protocols were in place, including screening questions prior to the visit, additional usage of staff PPE, and extensive cleaning of exam room while observing appropriate contact time as indicated for disinfecting solutions.  Repeat INR pending.  See notes on labs.  No bleeding.  We work the patient in given his recent symptoms.  Sugar recently 130 on home checks.  He had had some episodic HA, resolved now.  We talked about hypoglycemia cautions.    He doesn't feel lightheaded now but he had improved sitting down.  His voice sounds clearer today compared to prev. No swelling in BLE.  No CP.  Not SOB.    We talked about Covid considerations and in person office considerations.  I need him to call the triage line or have his family call triage line as he may be directed to the clinic or alternative sites depending on his symptoms.  This does not just apply to him.  This is clinic policy for patients in the midst of the pandemic.  Meds, vitals, and allergies reviewed.   ROS: Per HPI unless specifically indicated in ROS section   GEN: nad, alert and oriented, in wheelchair. HEENT: ncat NECK: supple w/o LA CV: Sounds to be regular rate and rhythm. 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

## 2019-06-26 NOTE — Telephone Encounter (Signed)
Call transferred to NT for triage assessment.  Pt's daughter on line. Pt is not present. States pt is on the way to lab for  INR. Reports has had intermittent dizziness and headache since weekend. Tylenol has helped headache. States dizziness worse with sitting to standing. States had episode going to car for appt, resolved with sitting down.  Unable to triage pt, not present. Pt is about to arrive at lab with friend. Attempted to reach practice, recording. Advised to have friend go to door, masked, and let them know pts symptoms, if able to have lab draw.  Pt's daughter requesting pt to be seen at [place]. States does not want to go to ED.  Please advise:  'Zenia Resides' [phone] (presently with pt)  Pts daughter [phone]

## 2019-06-26 NOTE — Telephone Encounter (Signed)
Pt came to office for INR; see nurse triage note. Pt could not answer some of my questions; I spoke with Larrell (DPR signed) Gareth Morgan said this weekend on and off pt had H/a; not sure of pain level and dizziness; not sure if room spinned around or just lightheaded. Pat care giver told [place] pts eyes werer blurred on 12/20.20.no weakness and no CP or SOB. T 97.9 P 61 BP 120/80 pulse ox 97%. Dr Damita Dunnings said since pt is here he will see him when finishes with pts he is seeing now. Pt voiced understanding and is in treatment room to be seen. Pt has been arrived. A friend is with the pt now.

## 2019-06-27 NOTE — Telephone Encounter (Signed)
Larrelle,  [facility]is has attempted to call you several times to schedule your father for an appointment.  Please call [phone] to schedule.

## 2019-06-28 NOTE — Assessment & Plan Note (Signed)
He could have relative hypoglycemia but is more likely that he has mild transient hypotension likely from overtreated hypertension. Stop lisinopril for now.  Drink enough water to keep urine clear or light colored.  Reasonable to get a small snack between meals.   Update me as needed.  Patient and caregiver agree.

## 2019-07-05 ENCOUNTER — Encounter: Payer: Self-pay | Admitting: Family Medicine

## 2019-07-13 ENCOUNTER — Ambulatory Visit (INDEPENDENT_AMBULATORY_CARE_PROVIDER_SITE_OTHER): Payer: Medicare (Managed Care) | Admitting: General Practice

## 2019-07-13 ENCOUNTER — Other Ambulatory Visit: Payer: Self-pay

## 2019-07-13 DIAGNOSIS — Z7901 Long term (current) use of anticoagulants: Secondary | ICD-10-CM | POA: Insufficient documentation

## 2019-07-13 DIAGNOSIS — I4891 Unspecified atrial fibrillation: Secondary | ICD-10-CM

## 2019-07-13 LAB — POCT INR: INR: 2.3 (ref 2.0–3.0)

## 2019-07-13 NOTE — Patient Instructions (Addendum)
Pre visit review using our clinic review tool, if applicable. No additional management support is needed unless otherwise documented below in the visit note.  Please continue to take 3 mg daily except 2 mg on tuesdays.  Re-check in 4 weeks.

## 2019-07-16 NOTE — Progress Notes (Signed)
Agree. Thanks

## 2019-07-17 ENCOUNTER — Encounter: Payer: Self-pay | Admitting: Family Medicine

## 2019-07-18 ENCOUNTER — Telehealth: Payer: Self-pay | Admitting: Lab

## 2019-07-18 NOTE — Telephone Encounter (Signed)
Called Pt (daughter) No answer left VM to call office. 

## 2019-07-18 NOTE — Telephone Encounter (Signed)
Called Pt (daughter) No answer left VM to call office.

## 2019-07-18 NOTE — Telephone Encounter (Signed)
Called Pt daughter with information from Dr. Para March. Pt daughter stated she was supposed to take her dad to [facility] today for his INR check and she did not. Pt daughter stated she will just wait until 08/10/19 ( which she had already scheduled) to bring her dad back to [facility] for his INR check. She also stated her dad will be coming to [facility] as a Pt of Dr. Para March, and they will be paying out of pocket for the visits.

## 2019-07-18 NOTE — Telephone Encounter (Signed)
Patient's daughter,Laurel Long,returned Jill's call.  She can be reached at [phone].

## 2019-07-18 NOTE — Telephone Encounter (Signed)
See my chart message.  Please call the family and make sure responsibility for care is clearly delineated.  See if we are responsible for anything other than INR checks at this point.  Please see when he will be able to transition from the [facility] program to here.  Once that is done, please contact the [facility] program and let them know we can do his INRs here.  Thanks.

## 2019-07-19 NOTE — Telephone Encounter (Signed)
Noted.  Thanks.  I will defer to patient/family.

## 2019-07-27 ENCOUNTER — Ambulatory Visit: Payer: Self-pay | Admitting: Urology

## 2019-07-31 NOTE — Progress Notes (Signed)
Cardiology Office Note  Date:  08/01/2019   ID:  Darrell Hayes, Darrell Hayes [birth year], MRN [id number]  PCP:  Joaquim Nam, MD   Chief Complaint  Patient presents with  . New Patient (Initial Visit)    Patient c/o Swelling in feet. Meds reviewed verbally with patient.m    HPI:  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,  permanent atrial fibrillation, hypertension,  hyperlipidemia,  past h/o pipe smoking (quit 2008),  seizure disorder,  Hospitalization in June 2020 for atrial fibrillation with RVR in the setting of dehydration, acute cholecystitis Who presents for follow-up of his atrial fibrillation  Prior records reviewed Back in June 2020 developed trauma to his right knee, large hematoma right leg managed by [facility] Received treatment for urinary tract infection, on antibiotics Developed copious watery diarrhea abdominal discomfort, particularly right upper quadrant  Was referred to the emergency room  BP 81/50, HR 127 bpm, RR 30, T 98.97F. WBC 19.8, anemia with hemoglobin 10.5   creatinine 3.67, BUN 56,  Troponin negative. Lactic acid 2.4.  INR supratherapeutic at5.6.  Treated with amiodarone/metoprolol for rate control He spent at least 1 month in rehab Has been living at home with his daughter, still followed by [facility] Daughter would like to move away from [facility] and establish with a different agency  UTI in nov 2020 Reports he had difficulty speaking at the time  Dizzy spell in Dec 2020 Lisinopril held, symptoms resolved  INR 2.3, followed by primary care/Dr. Para March  Plan to stay hydrated Drinks tea/coffee/OJ,    Lab work reviewed HBA1C 6.7 Total chol 104, LDL 52  EKG personally reviewed by myself on todays visit Shows normal sinus rhythm with rate 63 bpm no significant ST or T wave  changes  09/26/2008: cardiac catheterization with stent placement  90% stenosis in the LAD  80% stenosis in the distal LAD, EF 55%.  PCI to the mid LAD lesion with bare-metal stent.   PMH:   has a past medical history of Agitation, Alveolar/parietoalveolar pneumonopathy, other, Atrial fibrillation (HCC), CAD (coronary artery disease) (09/2008), CVA (cerebral vascular accident) (HCC) (2007), Dyspnea (09/2008), Hepatitis, Hiatal hernia, History of chickenpox, Hyperlipidemia, Hypertension, Nonspecific abnormal results of pulmonary system function study, Pulmonary nodules (09/2008), and Seizures (HCC) (12/2008).  PSH:    Past Surgical History:  Procedure Laterality Date  . APPENDECTOMY    . IR PERC CHOLECYSTOSTOMY  12/23/2018  . Stent placed  09/2008    Current Outpatient Medications  Medication Sig Dispense Refill  . albuterol (PROVENTIL) (2.5 MG/3ML) 0.083% nebulizer solution Take 2.5 mg by nebulization every 4 (four) hours as needed for wheezing or shortness of breath.    Marland Kitchen amiodarone (PACERONE) 100 MG tablet Take 100 mg by mouth at bedtime.    . cholecalciferol (VITAMIN D) 1000 UNITS tablet Take 1,000 Units by mouth daily.      Marland Kitchen docusate sodium (COLACE) 100 MG capsule Take 100 mg by mouth daily.     Marland Kitchen levothyroxine (SYNTHROID) 50 MCG tablet Take 50 mcg by mouth every evening.    . metoprolol succinate (TOPROL-XL) 25 MG 24 hr tablet Take 25 mg by mouth at bedtime.    . rosuvastatin (CRESTOR) 20 MG tablet Take 1 tablet (20 mg total) by mouth daily. 90 tablet 3  . sulfamethoxazole-trimethoprim (BACTRIM) 400-80 MG tablet Take 1 tablet by mouth once.    . tamsulosin (FLOMAX)  0.4 MG CAPS capsule Take 0.8 mg by mouth at bedtime.    Marland Kitchen warfarin (COUMADIN) 1 MG tablet Take 3 mg Sunday, Monday, Wednesday, Thursday Friday and Saturday.  Take 2 mg Tuesday    . amiodarone (PACERONE) 200 MG tablet Take 1 tablet (200 mg total) by mouth daily as needed (for atrial fibrillation). 30 tablet 0  . metoprolol  succinate (TOPROL-XL) 25 MG 24 hr tablet Take 1 tablet (25 mg total) by mouth daily as needed (for atrial fibrillation). Take with or immediately following a meal. 30 tablet 0   No current facility-administered medications for this visit.    Allergies:   Patient has no known allergies.   Social History:  The patient  reports that he quit smoking about 12 years ago. His smoking use included cigarettes and pipe. He quit after 32.00 years of use. He has never used smokeless tobacco. He reports that he does not drink alcohol.   Family History:   family history includes Arthritis in his mother; Heart disease in an other family member; Hyperlipidemia in his mother; Hypertension in his mother; Stroke in his father.    Review of Systems: Review of Systems  Constitutional: Negative.   HENT: Negative.   Respiratory: Negative.   Cardiovascular: Negative.   Gastrointestinal: Negative.   Musculoskeletal: Negative.        Leg weakness  Neurological: Negative.        Weakness right side  Psychiatric/Behavioral: Negative.   All other systems reviewed and are negative.    PHYSICAL EXAM: VS:  BP 126/60 (BP Location: Left Arm, Patient Position: Sitting, Cuff Size: Normal)   Pulse 63   Ht 5\' 8"  (1.727 m)   Wt 203 lb (92.1 kg)   BMI 30.87 kg/m  , BMI Body mass index is 30.87 kg/m. GEN: Well nourished, well developed, in no acute distress HEENT: normal Neck: no JVD, carotid bruits, or masses Cardiac: RRR; no murmurs, rubs, or gallops,no edema  Respiratory:  clear to auscultation bilaterally, normal work of breathing GI: soft, nontender, nondistended, + BS MS: no deformity or atrophy Skin: warm and dry, no rash Neuro:  Strength and sensation are intact Psych: euthymic mood, full affect   Recent Labs: 12/22/2018: Magnesium 2.5 06/15/2019: ALT 9; BUN 20; Creatinine, Ser 1.46; Hemoglobin 11.7; Platelets 246.0; Potassium 4.5; Sodium 136; TSH 3.02    Lipid Panel Lab Results  Component Value  Date   CHOL 104 06/15/2019   HDL 38.30 (L) 06/15/2019   LDLCALC 52 06/15/2019   TRIG 72.0 06/15/2019    Wt Readings from Last 3 Encounters:  08/01/19 203 lb (92.1 kg)  06/15/19 203 lb 6 oz (92.3 kg)  02/10/19 170 lb (77.1 kg)     ASSESSMENT AND PLAN:  Problem List Items Addressed This Visit      Cardiology Problems   Essential hypertension   Relevant Medications   amiodarone (PACERONE) 200 MG tablet   metoprolol succinate (TOPROL-XL) 25 MG 24 hr tablet   ATRIAL FIBRILLATION - Primary   Relevant Medications   amiodarone (PACERONE) 200 MG tablet   metoprolol succinate (TOPROL-XL) 25 MG 24 hr tablet   Other Relevant Orders   EKG 12-Lead   Type 2 diabetes mellitus with vascular disease (HCC)   Relevant Medications   amiodarone (PACERONE) 200 MG tablet   metoprolol succinate (TOPROL-XL) 25 MG 24 hr tablet   HYPERCHOLESTEROLEMIA, PURE   Relevant Medications   amiodarone (PACERONE) 200 MG tablet   metoprolol succinate (TOPROL-XL) 25 MG 24 hr  tablet    Other Visit Diagnoses    Coronary artery disease of native artery of native heart with stable angina pectoris (HCC)       Relevant Medications   amiodarone (PACERONE) 200 MG tablet   metoprolol succinate (TOPROL-XL) 25 MG 24 hr tablet     1.  Atrial fibrillation with RVR Maintaining normal sinus rhythm Daughter will continue to monitor heart rate Recommend he stay on metoprolol with low-dose amiodarone, warfarin We did discuss other types of anticoagulation, daughter prefers to continue current regiment  2) CAD With stable angina Prior stent LAD 10 years ago denies anginal symptoms Cholesterol at goal  3) PAD Known carotid disease We will discuss with him scheduling surveillance ultrasound  4.  Hyperlipidemia Cholesterol at goal, continue statin  5 history of stroke History of paroxysmal atrial fibrillation, on warfarin Residual right-sided deficits  Disposition:   F/U  6 months  Long review of prior  records with patient's daughter  Total encounter time more than 45 minutes  Greater than 50% was spent in counseling and coordination of care with the patient    Signed, Esmond Plants, M.D., Ph.D. [place], [place]

## 2019-08-01 ENCOUNTER — Ambulatory Visit (INDEPENDENT_AMBULATORY_CARE_PROVIDER_SITE_OTHER): Payer: Medicare (Managed Care) | Admitting: Cardiovascular Disease

## 2019-08-01 ENCOUNTER — Encounter: Payer: Self-pay | Admitting: Cardiovascular Disease

## 2019-08-01 ENCOUNTER — Other Ambulatory Visit: Payer: Self-pay

## 2019-08-01 VITALS — BP 126/60 | HR 63 | Ht 68.0 in | Wt 203.0 lb

## 2019-08-01 DIAGNOSIS — I25118 Atherosclerotic heart disease of native coronary artery with other forms of angina pectoris: Secondary | ICD-10-CM | POA: Diagnosis not present

## 2019-08-01 DIAGNOSIS — I480 Paroxysmal atrial fibrillation: Secondary | ICD-10-CM | POA: Diagnosis not present

## 2019-08-01 DIAGNOSIS — I10 Essential (primary) hypertension: Secondary | ICD-10-CM

## 2019-08-01 DIAGNOSIS — E7800 Pure hypercholesterolemia, unspecified: Secondary | ICD-10-CM

## 2019-08-01 DIAGNOSIS — E1159 Type 2 diabetes mellitus with other circulatory complications: Secondary | ICD-10-CM

## 2019-08-01 MED ORDER — AMIODARONE HCL 200 MG PO TABS: 200.0000 mg | ORAL_TABLET | Freq: Every day | ORAL | 0 refills | Status: DC | PRN

## 2019-08-01 MED ORDER — METOPROLOL SUCCINATE ER 25 MG PO TB24: 25.0000 mg | ORAL_TABLET | Freq: Every day | ORAL | 0 refills | Status: DC | PRN

## 2019-08-01 NOTE — Patient Instructions (Signed)

## 2019-08-10 ENCOUNTER — Ambulatory Visit (INDEPENDENT_AMBULATORY_CARE_PROVIDER_SITE_OTHER): Payer: Medicare (Managed Care)

## 2019-08-10 ENCOUNTER — Telehealth: Payer: Self-pay | Admitting: *Deleted

## 2019-08-10 ENCOUNTER — Other Ambulatory Visit: Payer: Self-pay

## 2019-08-10 ENCOUNTER — Ambulatory Visit: Payer: Self-pay | Admitting: Urology

## 2019-08-10 DIAGNOSIS — I4891 Unspecified atrial fibrillation: Secondary | ICD-10-CM

## 2019-08-10 DIAGNOSIS — I639 Cerebral infarction, unspecified: Secondary | ICD-10-CM

## 2019-08-10 DIAGNOSIS — Z7901 Long term (current) use of anticoagulants: Secondary | ICD-10-CM | POA: Diagnosis not present

## 2019-08-10 LAB — POCT INR: INR: 2.4 (ref 2.0–3.0)

## 2019-08-10 NOTE — Patient Instructions (Addendum)
Pre visit review using our clinic review tool, if applicable. No additional management support is needed unless otherwise documented below in the visit note.  Please continue to take 3 mg daily except 2 mg on tuesdays.  Re-check in 4 weeks.   

## 2019-08-10 NOTE — Telephone Encounter (Signed)
Dr. Lajuana Matte with [facility] left a VM at triage requesting PCP to call her back she wanted to discuss pt's status, she also wants some records sent to them she said the daughter should have signed a release to speak with her, Dr. Victory Dakin request call back at [phone]

## 2019-08-11 NOTE — Telephone Encounter (Signed)
Records release signed and faxed to [facility].

## 2019-08-11 NOTE — Telephone Encounter (Signed)
Late entry.  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  Evidently the patient's daughter wanted to transition him out of the program.  Dr. Victory Dakin wanted to make sure the patient had follow-up here.  He had routine labs done recently and has been following up for his INR.  I reassured her that we would work to meet his care needs as allowed in the setting of an outpatient clinic.  The [facility] program does offer various services and we talked about that.  I can send her a record request again as she did not see that as having come through previously.  I thanked her for the call and she thanked me for looking after the patient.  I appreciate her effort.  Please send record request to [phone].

## 2019-08-11 NOTE — Telephone Encounter (Signed)
Records release filled out for patient, awaiting method of getting signature.

## 2019-08-13 NOTE — Progress Notes (Signed)
Agree. Thanks

## 2019-08-14 ENCOUNTER — Other Ambulatory Visit: Payer: Self-pay | Admitting: Family Medicine

## 2019-08-14 DIAGNOSIS — N390 Urinary tract infection, site not specified: Secondary | ICD-10-CM

## 2019-08-15 ENCOUNTER — Ambulatory Visit (INDEPENDENT_AMBULATORY_CARE_PROVIDER_SITE_OTHER): Payer: PRIVATE HEALTH INSURANCE | Admitting: Urology

## 2019-08-15 ENCOUNTER — Other Ambulatory Visit: Payer: Self-pay

## 2019-08-15 ENCOUNTER — Encounter: Payer: Self-pay | Admitting: Urology

## 2019-08-15 ENCOUNTER — Other Ambulatory Visit
Admission: RE | Admit: 2019-08-15 | Discharge: 2019-08-15 | Disposition: A | Discharge status: Home / Self Care | Payer: Medicare (Managed Care) | Source: Ambulatory Visit | Attending: Urology | Admitting: Urology

## 2019-08-15 VITALS — BP 146/67 | HR 61 | Ht 68.0 in | Wt 203.0 lb

## 2019-08-15 DIAGNOSIS — N390 Urinary tract infection, site not specified: Secondary | ICD-10-CM

## 2019-08-15 LAB — URINALYSIS, COMPLETE (UACMP) WITH MICROSCOPIC
Bacteria, UA: NONE SEEN
Bilirubin Urine: NEGATIVE
Glucose, UA: NEGATIVE mg/dL
Ketones, ur: NEGATIVE mg/dL
Nitrite: NEGATIVE
Protein, ur: NEGATIVE mg/dL
Specific Gravity, Urine: 1.015 (ref 1.005–1.030)
pH: 7 (ref 5.0–8.0)

## 2019-08-15 LAB — BLADDER SCAN AMB NON-IMAGING

## 2019-08-15 NOTE — Patient Instructions (Signed)
Take cranberry tablets twice daily to help prevent UTI   Urinary Tract Infection, Adult A urinary tract infection (UTI) is an infection of any part of the urinary tract. The urinary tract includes:  The kidneys.  The ureters.  The bladder.  The urethra. These organs make, store, and get rid of pee (urine) in the body. What are the causes? This is caused by germs (bacteria) in your genital area. These germs grow and cause swelling (inflammation) of your urinary tract. What increases the risk? You are more likely to develop this condition if:  You have a small, thin tube (catheter) to drain pee.  You cannot control when you pee or poop (incontinence).  You are male, and: ? You use these methods to prevent pregnancy:  A medicine that kills sperm (spermicide).  A device that blocks sperm (diaphragm). ? You have low levels of a male hormone (estrogen). ? You are pregnant.  You have genes that add to your risk.  You are sexually active.  You take antibiotic medicines.  You have trouble peeing because of: ? A prostate that is bigger than normal, if you are male. ? A blockage in the part of your body that drains pee from the bladder (urethra). ? A kidney stone. ? A nerve condition that affects your bladder (neurogenic bladder). ? Not getting enough to drink. ? Not peeing often enough.  You have other conditions, such as: ? Diabetes. ? A weak disease-fighting system (immune system). ? Sickle cell disease. ? Gout. ? Injury of the spine. What are the signs or symptoms? Symptoms of this condition include:  Needing to pee right away (urgently).  Peeing often.  Peeing small amounts often.  Pain or burning when peeing.  Blood in the pee.  Pee that smells bad or not like normal.  Trouble peeing.  Pee that is cloudy.  Fluid coming from the vagina, if you are male.  Pain in the belly or lower back. Other symptoms include:  Throwing up (vomiting).  No  urge to eat.  Feeling mixed up (confused).  Being tired and grouchy (irritable).  A fever.  Watery poop (diarrhea). How is this treated? This condition may be treated with:  Antibiotic medicine.  Other medicines.  Drinking enough water. Follow these instructions at home:  Medicines  Take over-the-counter and prescription medicines only as told by your doctor.  If you were prescribed an antibiotic medicine, take it as told by your doctor. Do not stop taking it even if you start to feel better. General instructions  Make sure you: ? Pee until your bladder is empty. ? Do not hold pee for a long time. ? Empty your bladder after sex. ? Wipe from front to back after pooping if you are a male. Use each tissue one time when you wipe.  Drink enough fluid to keep your pee pale yellow.  Keep all follow-up visits as told by your doctor. This is important. Contact a doctor if:  You do not get better after 1-2 days.  Your symptoms go away and then come back. Get help right away if:  You have very bad back pain.  You have very bad pain in your lower belly.  You have a fever.  You are sick to your stomach (nauseous).  You are throwing up. Summary  A urinary tract infection (UTI) is an infection of any part of the urinary tract.  This condition is caused by germs in your genital area.  There are many  risk factors for a UTI. These include having a small, thin tube to drain pee and not being able to control when you pee or poop.  Treatment includes antibiotic medicines for germs.  Drink enough fluid to keep your pee pale yellow. This information is not intended to replace advice given to you by your health care provider. Make sure you discuss any questions you have with your health care provider. Document Revised: 06/09/2018 Document Reviewed: 12/30/2017 Elsevier Patient Education  2020 [employer].

## 2019-08-15 NOTE — Progress Notes (Signed)
08/15/19 3:20 PM   Darrell Hayes 1940/12/21 [id number]  Referring provider: Gareth Morgan, MD [address] [place],  [place] [address]  CC: rUTI  HPI: I saw Darrell Hayes in urology clinic today for evaluation of recurrent UTIs.  He is a very comorbid and frail-appearing 79 year old male with history of CAD and stroke on Coumadin.  He is here with his daughter today who provides most of the history.  She reports he has had multiple UTIs over the last 6 months, however the only positive culture in our medical record is Enterococcus in June 2020.  She reports that when he has a UTI he has lethargy, fatigue, and confusion.  He denies any urinary complaints at baseline, specifically incontinence, gross hematuria, or difficulty urinating.  He has previously been on low-dose Bactrim prophylaxis which has not improved his recurrent infections.  He also has reportedly had some "mucus" in his urine.  He denies any flank pain.  Most recent imaging was a CT abdomen pelvis without contrast from June 2020 that showed no hydronephrosis or urolithiasis, and bladder was decompressed.  Urinalysis today shows no bacteria, 11-20 RBCs, 6-10 WBCs, 0-5 squamous cells, moderate leukocytes, nitrite negative. PVR 0 mL.   PMH: Past Medical History:  Diagnosis Date  . Agitation    and irritability, situational exacerbation  . Alveolar/parietoalveolar pneumonopathy, other    06/14/09 CT chest, Mild LLL GGO; 02/28/10 CT chest , improved  . Atrial fibrillation (HCC)    Coumadin therapy - Dr. Stanford Breed.  Cardioversion 08/23/09, Amiodarone Rx since 09/03/2009  . CAD (coronary artery disease) 09/2008   Dr. Stanford Breed, stent to LAD  . CVA (cerebral vascular accident) ([place]) 2007   In the setting of atrial fibrillation  . Dyspnea 09/2008   Class 3, since 2008.  Significant tachycardia.  No PE or fibrosis on CT chest 3/10, 12/10, 02/2010, normal PFT but isolated low DLCO  06/2009, normal hgb, TSH, creat and albumin ,  Dec 2010.  Resolved after A Fib cardioversion and Amiodarone Marh 2011  . Hepatitis   . Hiatal hernia   . History of chickenpox   . Hyperlipidemia   . Hypertension   . Nonspecific abnormal results of pulmonary system function study   . Pulmonary nodules 09/2008   Nonspecific reticulonodular nodules RLL superior segment, resolved on CT 06/14/09  . Seizures ([place]) 12/2008   Dr. Gaynell Face    Surgical History: Past Surgical History:  Procedure Laterality Date  . APPENDECTOMY    . IR PERC CHOLECYSTOSTOMY  12/23/2018  . Stent placed  09/2008    Allergies: No Known Allergies  Family History: Family History  Problem Relation Age of Onset  . Hyperlipidemia Mother   . Hypertension Mother   . Arthritis Mother        osteoarthritis  . Stroke Father   . Heart disease Other        Family history of heart disease    Social History:  reports that he quit smoking about 12 years ago. His smoking use included cigarettes and pipe. He quit after 32.00 years of use. He has never used smokeless tobacco. He reports that he does not drink alcohol. No history on file for drug.  ROS: Please see flowsheet from today's date for complete review of systems.  Physical Exam: BP (!) 146/67 (BP Location: Left Arm, Patient Position: Sitting, Cuff Size: Normal)   Pulse 61   Ht 5\' 8"  (1.727 m)   Wt 203 lb (92.1 kg)  BMI 30.87 kg/m    Constitutional: Frail-appearing, in wheelchair. Cardiovascular: No clubbing, cyanosis, or edema. Respiratory: Normal respiratory effort, no increased work of breathing. GI: Abdomen is soft, nontender, nondistended, no abdominal masses Lymph: No cervical or inguinal lymphadenopathy.  Laboratory Data: Reviewed, see HPI  Urinalysis today no bacteria, 11-20 RBCs, 6-10 WBCs, 0-5 squamous cells, moderate leukocytes, nitrite negative  Assessment & Plan:   In summary, he is a co-morbid and frail-appearing 79 year old male with multiple UTIs over the last 6 months.  He is  emptying well with a PVR of 0 mL and recent CT abdomen pelvis in June 2020 showed no urolithiasis or hydronephrosis.  Urinalysis with microscopic hematuria today, will send for culture.  We discussed the evaluation and treatment of patients with recurrent UTIs at length.  We specifically discussed the differences between asymptomatic bacteriuria and true urinary tract infection.  We discussed the AUA definition of recurrent UTI of at least 2 culture proven symptomatic acute cystitis episodes in a 59-month period, or 3 within a 1 year period.  We discussed the importance of culture directed antibiotic treatment, and antibiotic stewardship.  First-line therapy includes nitrofurantoin(5 days), Bactrim(3 days), or fosfomycin(3 g single dose).  Possible etiologies of recurrent infection include constipation, incomplete emptying, anatomic abnormalities, and even genetic predisposition.  Finally, we discussed the role of perineal hygiene, timed voiding, adequate hydration, cranberry prophylaxis, and low-dose antibiotic prophylaxis.  -Will send urine for culture, if no growth recommend hematuria work-up with CT urogram and cystoscopy, call with results -Trial of cranberry prophylaxis twice daily  A total of 45 minutes were spent face-to-face with the patient, greater than 50% was spent in patient education, counseling, and coordination of care regarding recurrent UTIs and treatment options.  Sondra Come, MD  [facility] [address] [place], [place] [address] [phone]

## 2019-08-17 ENCOUNTER — Telehealth: Payer: Self-pay

## 2019-08-17 DIAGNOSIS — N390 Urinary tract infection, site not specified: Secondary | ICD-10-CM

## 2019-08-17 LAB — URINE CULTURE: Culture: 20000 — AB

## 2019-08-17 MED ORDER — CIPROFLOXACIN HCL 500 MG PO TABS: 500.0000 mg | ORAL_TABLET | Freq: Two times a day (BID) | ORAL | 0 refills | Status: AC

## 2019-08-17 NOTE — Progress Notes (Signed)
Pt informed

## 2019-08-17 NOTE — Telephone Encounter (Signed)
Called pt informed him of the information below. Pt gave verbal understanding. RX sent.  

## 2019-08-17 NOTE — Telephone Encounter (Signed)
-----   Message from Sondra Come, MD sent at 08/17/2019  1:18 PM EST ----- Small amount of bacteria in the urine, lets do cipro 500mg  BID x 10 days.  Then follow up with me in 3 months with repeat UA and symptom check.  , MD 08/17/2019

## 2019-09-04 ENCOUNTER — Telehealth: Payer: Self-pay

## 2019-09-04 NOTE — Telephone Encounter (Signed)
Pt has apt tomorrow for coumadin clinic. Needs to be screened for covid. LVM

## 2019-09-05 ENCOUNTER — Ambulatory Visit (INDEPENDENT_AMBULATORY_CARE_PROVIDER_SITE_OTHER): Payer: Medicare (Managed Care)

## 2019-09-05 ENCOUNTER — Other Ambulatory Visit (INDEPENDENT_AMBULATORY_CARE_PROVIDER_SITE_OTHER): Payer: Medicare (Managed Care)

## 2019-09-05 ENCOUNTER — Other Ambulatory Visit: Payer: Self-pay

## 2019-09-05 ENCOUNTER — Encounter: Payer: Self-pay | Admitting: Family Medicine

## 2019-09-05 ENCOUNTER — Telehealth: Payer: Self-pay

## 2019-09-05 DIAGNOSIS — Z7901 Long term (current) use of anticoagulants: Secondary | ICD-10-CM

## 2019-09-05 DIAGNOSIS — I639 Cerebral infarction, unspecified: Secondary | ICD-10-CM

## 2019-09-05 DIAGNOSIS — I4891 Unspecified atrial fibrillation: Secondary | ICD-10-CM

## 2019-09-05 LAB — PROTIME-INR
INR: 6.4 ratio (ref 0.8–1.0)
Prothrombin Time: 69.9 s (ref 9.6–13.1)

## 2019-09-05 LAB — POCT INR: INR: 6 — AB (ref 2.0–3.0)

## 2019-09-05 NOTE — Telephone Encounter (Signed)
See anticoag clinic note.

## 2019-09-05 NOTE — Progress Notes (Signed)
Agree.  Thanks.  cipro rx'd by outside clinic.

## 2019-09-05 NOTE — Patient Instructions (Addendum)
Pre visit review using our clinic review tool, if applicable. No additional management support is needed unless otherwise documented below in the visit note.  Hold dose today, tomorrow and the following day, then resume normal dosing. Return in one week.

## 2019-09-05 NOTE — Progress Notes (Signed)
Advised Dr. Para March of POCT result and lab draw. Dr. Para March verbalized ok to continue with prior instructions given.

## 2019-09-05 NOTE — Telephone Encounter (Signed)
[facility] called in a critical result @ 1705  Protime 69.9 INR 6.4

## 2019-09-08 ENCOUNTER — Ambulatory Visit (INDEPENDENT_AMBULATORY_CARE_PROVIDER_SITE_OTHER): Payer: Medicare (Managed Care)

## 2019-09-08 ENCOUNTER — Other Ambulatory Visit: Payer: Self-pay

## 2019-09-08 DIAGNOSIS — Z7901 Long term (current) use of anticoagulants: Secondary | ICD-10-CM | POA: Diagnosis not present

## 2019-09-08 DIAGNOSIS — I4891 Unspecified atrial fibrillation: Secondary | ICD-10-CM

## 2019-09-08 DIAGNOSIS — I639 Cerebral infarction, unspecified: Secondary | ICD-10-CM

## 2019-09-08 LAB — POCT INR: INR: 2.5 (ref 2.0–3.0)

## 2019-09-08 NOTE — Patient Instructions (Addendum)
Pre visit review using our clinic review tool, if applicable. No additional management support is needed unless otherwise documented below in the visit note.  Continue 3mg  daily except 2 mg on Tuesdays. Recheck in 3 wks.

## 2019-09-10 NOTE — Progress Notes (Signed)
Agree.  Thanks.   Given that he had a small amount of blood in his underwear with recent supratherapeutic INR related to concurrent antibiotic use for UTI, and given that the bleeding is stopped in the meantime as his INR has normalized, and given that he has been treated for UTI, I do not think it makes sense to put the patient through further work-up at this point.  If he has recurrent bleeding then please let me know.  I appreciate your help.

## 2019-09-12 ENCOUNTER — Ambulatory Visit: Payer: Medicare (Managed Care)

## 2019-09-14 ENCOUNTER — Ambulatory Visit (INDEPENDENT_AMBULATORY_CARE_PROVIDER_SITE_OTHER): Payer: Medicare (Managed Care) | Admitting: Family Medicine

## 2019-09-14 ENCOUNTER — Encounter: Payer: Self-pay | Admitting: Family Medicine

## 2019-09-14 ENCOUNTER — Other Ambulatory Visit: Payer: Self-pay

## 2019-09-14 VITALS — BP 122/60 | HR 46 | Temp 97.4°F | Ht 68.0 in | Wt 221.1 lb

## 2019-09-14 DIAGNOSIS — I4891 Unspecified atrial fibrillation: Secondary | ICD-10-CM

## 2019-09-14 DIAGNOSIS — E1159 Type 2 diabetes mellitus with other circulatory complications: Secondary | ICD-10-CM | POA: Diagnosis not present

## 2019-09-14 DIAGNOSIS — I639 Cerebral infarction, unspecified: Secondary | ICD-10-CM

## 2019-09-14 DIAGNOSIS — E119 Type 2 diabetes mellitus without complications: Secondary | ICD-10-CM

## 2019-09-14 DIAGNOSIS — I69351 Hemiplegia and hemiparesis following cerebral infarction affecting right dominant side: Secondary | ICD-10-CM | POA: Diagnosis not present

## 2019-09-14 DIAGNOSIS — R238 Other skin changes: Secondary | ICD-10-CM | POA: Diagnosis not present

## 2019-09-14 LAB — POCT GLYCOSYLATED HEMOGLOBIN (HGB A1C): Hemoglobin A1C: 6.4 % — AB (ref 4.0–5.6)

## 2019-09-14 MED ORDER — METOPROLOL SUCCINATE ER 25 MG PO TB24: 12.5000 mg | ORAL_TABLET | Freq: Every day | ORAL | Status: AC

## 2019-09-14 MED ORDER — NYSTATIN 100000 UNIT/GM EX POWD: 1.0000 "application " | Freq: Every day | CUTANEOUS | Status: AC | PRN

## 2019-09-14 MED ORDER — AMIODARONE HCL 200 MG PO TABS: 200.0000 mg | ORAL_TABLET | Freq: Every day | ORAL | 0 refills | Status: AC

## 2019-09-14 MED ORDER — METOPROLOL SUCCINATE ER 25 MG PO TB24: 12.5000 mg | ORAL_TABLET | Freq: Every day | ORAL | Status: DC

## 2019-09-14 NOTE — Progress Notes (Signed)
This visit occurred during the SARS-CoV-2 public health emergency.  Safety protocols were in place, including screening questions prior to the visit, additional usage of staff PPE, and extensive cleaning of exam room while observing appropriate contact time as indicated for disinfecting solutions.  Diabetes:  No meds.   Hypoglycemic episodes:no Hyperglycemic episodes: no Feet problems: intact sensation.   Blood Sugars averaging: not checked.   eye exam within last year: due this year, discussed.   A1c 6.4, down from 6.7.  Diet is good, eating well.    Skin irritation in the groin, itching locally.  Using nystatin and that seems to be helping in the meantime.  He is sleeping in recliner with feet up.  Some inc BLE edema noted in the last week.    No more bleeding.  Still anticoagulated.   See avs.    Pulse slightly lower than prev.  No heart racing noted by patient.  He gets "winded a little easier" per daughter's report.   Meds, vitals, and allergies reviewed.  ROS: Per HPI unless specifically indicated in ROS section   GEN: nad, alert and oriented, in wheelchair.   HEENT: ncat NECK: supple w/o LA CV: Sounds to be bradycardic. PULM: ctab, no inc wob ABD: soft, +bs EXT: 1+ BLE edema SKIN: no acute rash but he does have some irritation in the groin. Right hand and right foot weak at baseline from old CVA.  Diabetic foot exam: Normal inspection No skin breakdown No calluses  Normal DP pulses Change in sensation to light touch and monofilament at baseline.  He has decreased sensation on the right hand and foot from old CVA. Nails normal

## 2019-09-14 NOTE — Patient Instructions (Addendum)
If you ever take any antibiotics, tell them you are on coumadin and call us.    Cut metoprolol back to 1/2 tab a day and update me about your pulse and how you feel overall.   Recheck with Para March in about 3-4 months.  A1c/labs at the visit.  Recheck INR as scheduled.   Continue with nystatin but okay to use a little hydrocortisone cream if needed for itching.  Take care.  Glad to see you.

## 2019-09-17 DIAGNOSIS — R238 Other skin changes: Secondary | ICD-10-CM | POA: Insufficient documentation

## 2019-09-17 NOTE — Assessment & Plan Note (Signed)
Anticoagulated at baseline.  Continue Coumadin as is.  See after visit summary about medication changes and taking antibiotics in the future.  No more bleeding.   With lower pulse noted with decrease metoprolol down to 12.5 mg a day and update me about his pulse.  See after visit summary.

## 2019-09-17 NOTE — Assessment & Plan Note (Signed)
Right-sided weakness at baseline.  No new changes.  Continue as is.

## 2019-09-17 NOTE — Assessment & Plan Note (Signed)
Some better with nystatin use.  Can use topical hydrocortisone if needed, if still itching.

## 2019-09-17 NOTE — Assessment & Plan Note (Signed)
A1c 6.4, down from 6.7.  Diet is good, eating well.   No change in meds at this point.  Recheck periodically.  See after visit summary.

## 2019-09-26 ENCOUNTER — Ambulatory Visit: Payer: Medicare (Managed Care)

## 2019-09-27 ENCOUNTER — Telehealth: Payer: Self-pay

## 2019-09-27 NOTE — Telephone Encounter (Signed)
Pt has coumadin clinic apt tomorrow and needs screened for covid. LVM 

## 2019-09-28 ENCOUNTER — Ambulatory Visit (INDEPENDENT_AMBULATORY_CARE_PROVIDER_SITE_OTHER): Payer: Medicare (Managed Care)

## 2019-09-28 ENCOUNTER — Other Ambulatory Visit: Payer: Self-pay

## 2019-09-28 DIAGNOSIS — Z7901 Long term (current) use of anticoagulants: Secondary | ICD-10-CM | POA: Diagnosis not present

## 2019-09-28 LAB — POCT INR: INR: 3.5 — AB (ref 2.0–3.0)

## 2019-09-28 NOTE — Patient Instructions (Addendum)
Pre visit review using our clinic review tool, if applicable. No additional management support is needed unless otherwise documented below in the visit note.  Hold dose today then change weekly dosing to take 3mg  daily except 2mg  on Tues and Thurs. Recheck in 2 wks.

## 2019-10-04 ENCOUNTER — Encounter: Payer: Self-pay | Admitting: Surgery

## 2019-10-10 ENCOUNTER — Other Ambulatory Visit: Payer: Self-pay

## 2019-10-10 ENCOUNTER — Ambulatory Visit (INDEPENDENT_AMBULATORY_CARE_PROVIDER_SITE_OTHER): Payer: Medicare (Managed Care)

## 2019-10-10 DIAGNOSIS — I639 Cerebral infarction, unspecified: Secondary | ICD-10-CM

## 2019-10-10 DIAGNOSIS — I4891 Unspecified atrial fibrillation: Secondary | ICD-10-CM | POA: Diagnosis not present

## 2019-10-10 DIAGNOSIS — Z7901 Long term (current) use of anticoagulants: Secondary | ICD-10-CM

## 2019-10-10 LAB — POCT INR: INR: 3.7 — AB (ref 2.0–3.0)

## 2019-10-10 MED ORDER — WARFARIN SODIUM 3 MG PO TABS: ORAL_TABLET | ORAL | 1 refills | Status: AC

## 2019-10-10 MED ORDER — WARFARIN SODIUM 2 MG PO TABS: ORAL_TABLET | ORAL | 1 refills | Status: AC

## 2019-10-10 NOTE — Patient Instructions (Addendum)
Pre visit review using our clinic review tool, if applicable. No additional management support is needed unless otherwise documented below in the visit note.  Hold dose today then change weekly dosing to take 3mg  daily except 2mg  on Tues and Thurs and Sat.  Recheck in 2 wks.

## 2019-10-11 NOTE — Progress Notes (Signed)
Agree. Thanks

## 2019-10-15 ENCOUNTER — Encounter: Payer: Self-pay | Admitting: Family Medicine

## 2019-10-24 ENCOUNTER — Ambulatory Visit (INDEPENDENT_AMBULATORY_CARE_PROVIDER_SITE_OTHER): Payer: Medicare (Managed Care)

## 2019-10-24 ENCOUNTER — Other Ambulatory Visit: Payer: Self-pay

## 2019-10-24 DIAGNOSIS — Z7901 Long term (current) use of anticoagulants: Secondary | ICD-10-CM

## 2019-10-24 DIAGNOSIS — I4891 Unspecified atrial fibrillation: Secondary | ICD-10-CM

## 2019-10-24 DIAGNOSIS — I639 Cerebral infarction, unspecified: Secondary | ICD-10-CM

## 2019-10-24 LAB — POCT INR: INR: 3.1 — AB (ref 2.0–3.0)

## 2019-10-24 NOTE — Patient Instructions (Addendum)
Pre visit review using our clinic review tool, if applicable. No additional management support is needed unless otherwise documented below in the visit note.  Take 1mg  today then continue to take 3mg  daily except 2mg  on Tues and Thurs and Sat.  Recheck in 4 wks.

## 2019-10-25 NOTE — Progress Notes (Signed)
Agree. Thanks

## 2019-11-07 ENCOUNTER — Encounter: Payer: Self-pay | Admitting: Family Medicine

## 2019-11-14 ENCOUNTER — Telehealth (INDEPENDENT_AMBULATORY_CARE_PROVIDER_SITE_OTHER): Payer: Medicare (Managed Care) | Admitting: Urology

## 2019-11-14 ENCOUNTER — Other Ambulatory Visit: Payer: Self-pay

## 2019-11-14 DIAGNOSIS — N390 Urinary tract infection, site not specified: Secondary | ICD-10-CM | POA: Diagnosis not present

## 2019-11-14 NOTE — Progress Notes (Signed)
Virtual Visit via Telephone Note  I connected with  Darrell Hayes daughter Darrell Hayes by telephone and verified that I am speaking with the correct person using two identifiers.   I discussed the limitations, risks, security and privacy concerns of performing an evaluation and management service by telephone and the availability of in person appointments. We discussed the impact of the COVID-19 pandemic on the healthcare system, and the importance of social distancing and reducing patient and provider exposure. I also discussed with the patient that there may be a patient responsible charge related to this service. The patient expressed understanding and agreed to proceed.  Reason for visit: Hx UTI  History of Present Illness: I had phone follow-up with the patient and his daughter today.  He is a very comorbid 79 year old male with a history of UTI and mucus in the urine who I saw in early February 2021.  He had a Streptococcus UTI at that time with some microscopic hematuria, and was treated with Cipro.  We also started cranberry tablet prophylaxis for UTIs at that visit.  She reports has been doing very well and has had no problems since her last visit.  She denies any UTIs, gross hematuria, or urinary complaints of dysuria, urgency, or frequency.  She does occasionally notice some mucus in his pull-up.  Overall, he is doing well.  I recommended continuing cranberry tablet prophylaxis for history of UTI.  We discussed return precautions including gross hematuria, recurrent infections, or new urinary symptoms.  Follow Up: Continue cranberry tablet prophylaxis RTC 1 year for PVR, urinalysis, and symptom check   I discussed the assessment and treatment plan with the patient. The patient was provided an opportunity to ask questions and all were answered. The patient agreed with the plan and demonstrated an understanding of the instructions.   The patient was advised to call back or seek an in-person  evaluation if the symptoms worsen or if the condition fails to improve as anticipated.  I provided 12 minutes of non-face-to-face time during this encounter.   Sondra Come, MD

## 2019-11-21 ENCOUNTER — Ambulatory Visit (INDEPENDENT_AMBULATORY_CARE_PROVIDER_SITE_OTHER): Payer: Medicare (Managed Care)

## 2019-11-21 ENCOUNTER — Other Ambulatory Visit: Payer: Self-pay

## 2019-11-21 DIAGNOSIS — Z7901 Long term (current) use of anticoagulants: Secondary | ICD-10-CM

## 2019-11-21 LAB — POCT INR: INR: 3.6 — AB (ref 2.0–3.0)

## 2019-11-21 NOTE — Patient Instructions (Addendum)
Pre visit review using our clinic review tool, if applicable. No additional management support is needed unless otherwise documented below in the visit note.  Hold dose today then change weekly dose to take 2mg  daily except 3mg  Mon, Wed, Fri. Recheck in 3 wks.

## 2019-12-12 ENCOUNTER — Telehealth: Payer: Self-pay | Admitting: Family Medicine

## 2019-12-12 ENCOUNTER — Ambulatory Visit: Payer: Medicare (Managed Care)

## 2019-12-12 ENCOUNTER — Other Ambulatory Visit: Payer: Self-pay

## 2019-12-12 NOTE — Telephone Encounter (Signed)
Contacted Larrelle who reports she and the person that stays with the pt have had a GI bug but the pt has not. Advised pt should not wait until 6/22 since he has been out of range for the last 3 visits. She agreed to bring in pt on 6/15. Advised if he does develop any symptoms to contact the office. Larrelle verbalized understanding.   RS pt apt

## 2019-12-12 NOTE — Telephone Encounter (Signed)
Patient's daughter called today to r/s COUMADIN APPT  She stated patient has appointment on 6/22 @ 3:30 with Dr Para March and would like to know if there is anyway he could have his coumadin checked at that time while he is here.    Larrelle asked for a call back  # [phone] She stated if she did not answer to leave a message if the check could be done at the appt with Dr Para March. Or she stated you could sent my chart message

## 2019-12-19 ENCOUNTER — Ambulatory Visit (INDEPENDENT_AMBULATORY_CARE_PROVIDER_SITE_OTHER): Payer: Medicare (Managed Care)

## 2019-12-19 ENCOUNTER — Other Ambulatory Visit: Payer: Self-pay

## 2019-12-19 DIAGNOSIS — Z7901 Long term (current) use of anticoagulants: Secondary | ICD-10-CM

## 2019-12-19 DIAGNOSIS — I4891 Unspecified atrial fibrillation: Secondary | ICD-10-CM

## 2019-12-19 DIAGNOSIS — I639 Cerebral infarction, unspecified: Secondary | ICD-10-CM

## 2019-12-19 LAB — POCT INR: INR: 3.2 — AB (ref 2.0–3.0)

## 2019-12-19 NOTE — Patient Instructions (Addendum)
Pre visit review using our clinic review tool, if applicable. No additional management support is needed unless otherwise documented below in the visit note.  Hold dose today then change weekly dose to take 2mg  daily except 3mg  Mon &Fri. Recheck in 4 weeks per pt helper request.

## 2019-12-20 NOTE — Progress Notes (Signed)
Agree. Thanks

## 2019-12-26 ENCOUNTER — Other Ambulatory Visit: Payer: Self-pay

## 2019-12-26 ENCOUNTER — Encounter: Payer: Self-pay | Admitting: Family Medicine

## 2019-12-26 ENCOUNTER — Ambulatory Visit (INDEPENDENT_AMBULATORY_CARE_PROVIDER_SITE_OTHER): Payer: Medicare (Managed Care) | Admitting: Family Medicine

## 2019-12-26 VITALS — BP 146/72 | HR 64 | Temp 97.8°F | Ht 68.0 in | Wt 226.2 lb

## 2019-12-26 DIAGNOSIS — E1159 Type 2 diabetes mellitus with other circulatory complications: Secondary | ICD-10-CM | POA: Diagnosis not present

## 2019-12-26 LAB — POCT GLYCOSYLATED HEMOGLOBIN (HGB A1C): Hemoglobin A1C: 7.1 % — AB (ref 4.0–5.6)

## 2019-12-26 NOTE — Progress Notes (Signed)
This visit occurred during the SARS-CoV-2 public health emergency.  Safety protocols were in place, including screening questions prior to the visit, additional usage of staff PPE, and extensive cleaning of exam room while observing appropriate contact time as indicated for disinfecting solutions.  His legs got sunburned while going to a truck show.  Cautions d/w pt.  Routine care d/w pt.  He didn't blister.    Diabetes:  No meds.   Hypoglycemic episodes:  Hyperglycemic episodes: Feet problems: Blood Sugars averaging: eye exam within last year: A1c done at OV, d/w pt. elevated from previous.  Covid vaccination encouraged.  Meds, vitals, and allergies reviewed.  ROS: Per HPI unless specifically indicated in ROS section   GEN: nad, alert and oriented, in wheelchair baseline  HEENT: ncat, speech at baseline NECK: supple w/o LA CV: IRR, not tachycardic. PULM: ctab, no inc wob ABD: soft, +bs EXT: no edema SKIN: First-degree sunburn noted on the bilateral shins.  No blistering.

## 2019-12-26 NOTE — Patient Instructions (Signed)
Your A1c was up to 7.1.  You don't need to change your meds but I want to recheck your A1c in about 3 months.  Try to set that up with your INR appointment.  Try to cut back on sweets/carbs in the meantime.  Take care.  Glad to see you.

## 2019-12-27 NOTE — Assessment & Plan Note (Signed)
A1c higher than previous but not needing a medication change at this point based on his A1c.  He had been eating a lot more bread with tomato sandwiches.  Discussed changing to wheat bread and trying to limit carbs.  Return for routine INRs.  We can recheck A1c at an INR visit in about 3 months.  Sunburn cautions given.  This should resolve Covid vaccine encouraged.

## 2020-01-16 ENCOUNTER — Other Ambulatory Visit (INDEPENDENT_AMBULATORY_CARE_PROVIDER_SITE_OTHER): Payer: Medicare (Managed Care)

## 2020-01-16 ENCOUNTER — Ambulatory Visit (INDEPENDENT_AMBULATORY_CARE_PROVIDER_SITE_OTHER): Payer: Medicare (Managed Care)

## 2020-01-16 ENCOUNTER — Other Ambulatory Visit: Payer: Self-pay

## 2020-01-16 DIAGNOSIS — Z7901 Long term (current) use of anticoagulants: Secondary | ICD-10-CM | POA: Diagnosis not present

## 2020-01-16 DIAGNOSIS — I639 Cerebral infarction, unspecified: Secondary | ICD-10-CM

## 2020-01-16 DIAGNOSIS — E1159 Type 2 diabetes mellitus with other circulatory complications: Secondary | ICD-10-CM

## 2020-01-16 DIAGNOSIS — I4891 Unspecified atrial fibrillation: Secondary | ICD-10-CM

## 2020-01-16 LAB — POCT INR: INR: 3.5 — AB (ref 2.0–3.0)

## 2020-01-16 NOTE — Patient Instructions (Addendum)
Pre visit review using our clinic review tool, if applicable. No additional management support is needed unless otherwise documented below in the visit note.  Hold dose today then change weekly dose to take 2mg  daily. Recheck in 3 weeks per pt helper request.

## 2020-01-16 NOTE — Progress Notes (Signed)
a1c

## 2020-01-17 NOTE — Progress Notes (Signed)
Agree. Thanks

## 2020-02-06 ENCOUNTER — Ambulatory Visit (INDEPENDENT_AMBULATORY_CARE_PROVIDER_SITE_OTHER): Payer: Medicare (Managed Care)

## 2020-02-06 ENCOUNTER — Other Ambulatory Visit: Payer: Medicare (Managed Care)

## 2020-02-06 ENCOUNTER — Other Ambulatory Visit: Payer: Self-pay

## 2020-02-06 DIAGNOSIS — I639 Cerebral infarction, unspecified: Secondary | ICD-10-CM

## 2020-02-06 DIAGNOSIS — Z7901 Long term (current) use of anticoagulants: Secondary | ICD-10-CM | POA: Diagnosis not present

## 2020-02-06 DIAGNOSIS — I4891 Unspecified atrial fibrillation: Secondary | ICD-10-CM

## 2020-02-06 LAB — POCT INR: INR: 3.5 — AB (ref 2.0–3.0)

## 2020-02-06 LAB — POCT GLYCOSYLATED HEMOGLOBIN (HGB A1C): Hemoglobin A1C: 7.2 % — AB (ref 4.0–5.6)

## 2020-02-06 NOTE — Progress Notes (Signed)
A1C was also done at this visit for PCP

## 2020-02-06 NOTE — Patient Instructions (Addendum)
Pre visit review using our clinic review tool, if applicable. No additional management support is needed unless otherwise documented below in the visit note.  Hold dose today then change weekly dose to take 2mg  daily except take 1mg  on Monday and Thursday.  Recheck in 2 weeks.

## 2020-02-11 NOTE — Progress Notes (Signed)
Agree. Thanks

## 2020-02-20 ENCOUNTER — Ambulatory Visit: Payer: Medicare (Managed Care)

## 2020-02-22 ENCOUNTER — Other Ambulatory Visit: Payer: Self-pay

## 2020-02-22 ENCOUNTER — Ambulatory Visit (INDEPENDENT_AMBULATORY_CARE_PROVIDER_SITE_OTHER): Payer: Medicare (Managed Care)

## 2020-02-22 DIAGNOSIS — Z7901 Long term (current) use of anticoagulants: Secondary | ICD-10-CM | POA: Diagnosis not present

## 2020-02-22 DIAGNOSIS — I4891 Unspecified atrial fibrillation: Secondary | ICD-10-CM

## 2020-02-22 DIAGNOSIS — I639 Cerebral infarction, unspecified: Secondary | ICD-10-CM

## 2020-02-22 LAB — POCT INR: INR: 2 (ref 2.0–3.0)

## 2020-02-22 NOTE — Patient Instructions (Addendum)
Pre visit review using our clinic review tool, if applicable. No additional management support is needed unless otherwise documented below in the visit note.  Continue 2mg  daily except take 1mg  on Monday and Thursday.  Recheck in 2 weeks.

## 2020-02-24 NOTE — Progress Notes (Signed)
Agree. Thanks

## 2020-03-05 ENCOUNTER — Ambulatory Visit (INDEPENDENT_AMBULATORY_CARE_PROVIDER_SITE_OTHER): Payer: Medicare (Managed Care)

## 2020-03-05 ENCOUNTER — Other Ambulatory Visit: Payer: Self-pay

## 2020-03-05 ENCOUNTER — Telehealth: Payer: Self-pay | Admitting: Cardiovascular Disease

## 2020-03-05 DIAGNOSIS — I4891 Unspecified atrial fibrillation: Secondary | ICD-10-CM

## 2020-03-05 DIAGNOSIS — Z7901 Long term (current) use of anticoagulants: Secondary | ICD-10-CM | POA: Diagnosis not present

## 2020-03-05 DIAGNOSIS — I639 Cerebral infarction, unspecified: Secondary | ICD-10-CM

## 2020-03-05 LAB — POCT INR: INR: 1.9 — AB (ref 2.0–3.0)

## 2020-03-05 NOTE — Patient Instructions (Addendum)
Pre visit review using our clinic review tool, if applicable. No additional management support is needed unless otherwise documented below in the visit note.   Take 3 mg today then continue 2mg  daily except take 1mg  on Monday and Thursday.  Recheck in 3 weeks.

## 2020-03-05 NOTE — Progress Notes (Signed)
Cardiology Office Note  Date:  03/06/2020   ID:  Darrell Hayes, Darrell Hayes, Darrell Hayes, MRN [id number]  PCP:  Joaquim Nam, MD   Chief Complaint  Patient presents with  . office visit    6 month F/U-Patient reports DOE; Meds verbally reviewed with patient's daughter.    HPI:  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,  permanent atrial fibrillation, hypertension,  hyperlipidemia,  past h/o pipe smoking (quit 2008),  seizure disorder,  Hospitalization in June 2020 for atrial fibrillation with RVR in the setting of dehydration, acute cholecystitis Who presents for follow-up of his atrial fibrillation  walking in the house, little bit here and there Does not go much outside the house, does not go to the mall or walking down the street Unable to use a regular walker Balance ok Uses a hemiwalker, able to grip with the left arm 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, from CVA Right leg also weak No recent falls No angina, denies significant shortness of breath In general very sedentary  managed by [facility], [facility], [facility] primary care but not cardiology  Denies any recent urinary tract infections  Lab work reviewed hemoglobin A1c 7.2, slight trend upwards Scheduled to have repeat lab work in follow-up next month with primary care  EKG personally reviewed by myself on todays visit Shows normal sinus rhythm rate 72 bpm no significant ST or T wave changes   Prior records reviewed Back in June 2020 developed trauma to his right knee, large hematoma right leg  Received treatment for urinary tract infection, on antibiotics Developed copious watery diarrhea abdominal discomfort, particularly right upper quadrant  Was referred to the emergency room  BP 81/50, HR 127 bpm, RR 30, T 98.3F. WBC 19.8, anemia with hemoglobin 10.5   creatinine 3.67, BUN 56,   Troponin negative. Lactic acid 2.4.  INR supratherapeutic at5.6.  Treated with amiodarone/metoprolol for rate control He spent at least 1 month in rehab Has been living at home with his daughter, still followed by [facility] Daughter would like to move away from [facility] and establish with a different agency  UTI in nov 2020   09/26/2008: cardiac catheterization with stent placement  90% stenosis in the LAD  80% stenosis in the distal LAD, EF 55%.  PCI to the mid LAD lesion with bare-metal stent.   PMH:   has a past medical history of Agitation, Alveolar/parietoalveolar pneumonopathy, other, Atrial fibrillation (HCC), CAD (coronary artery disease) (09/2008), CVA (cerebral vascular accident) (HCC) (2007), Dyspnea (09/2008), Hepatitis, Hiatal hernia, History of chickenpox, Hyperlipidemia, Hypertension, Nonspecific abnormal results of pulmonary system function study, Pulmonary nodules (09/2008), and Seizures (HCC) (12/2008).  PSH:    Past Surgical History:  Procedure Laterality Date  . APPENDECTOMY    . IR PERC CHOLECYSTOSTOMY  12/23/2018  . Stent placed  09/2008    Current Outpatient Medications  Medication Sig Dispense Refill  . albuterol (PROVENTIL) (2.5 MG/3ML) 0.083% nebulizer solution Take 2.5 mg by nebulization every 4 (four) hours as needed for wheezing or shortness of breath.    Marland Kitchen amiodarone (PACERONE) 200 MG tablet Take 1 tablet (200 mg total) by mouth daily. 30 tablet 0  . cholecalciferol (VITAMIN D) 1000 UNITS tablet Take 1,000 Units by mouth daily.      Marland Kitchen docusate sodium (COLACE) 100 MG capsule Take 100 mg by mouth daily.     Marland Kitchen levothyroxine (SYNTHROID) 50 MCG tablet Take 50  mcg by mouth every evening.    . metoprolol succinate (TOPROL-XL) 25 MG 24 hr tablet Take 0.5 tablets (12.5 mg total) by mouth daily. Take with or immediately following a meal.    . nystatin (MYCOSTATIN/NYSTOP) powder Apply 1 application topically daily as needed.    . rosuvastatin (CRESTOR) 20 MG tablet  Take 1 tablet (20 mg total) by mouth daily. 90 tablet 3  . tamsulosin (FLOMAX) 0.4 MG CAPS capsule Take 0.8 mg by mouth at bedtime.    Marland Kitchen warfarin (COUMADIN) 2 MG tablet Take 2mg  by mouth Tues, Thurs, and Sat or as directed by coumadin clinic. 30 tablet 1  . warfarin (COUMADIN) 3 MG tablet Take 3 mg by mouth Mon, Wed, Fri and Sun or as directed by coumadin clinic. 30 tablet 1   No current facility-administered medications for this visit.    Allergies:   Tamiflu [oseltamivir]   Social History:  The patient  reports that he quit smoking about 13 years ago. His smoking use included cigarettes and pipe. He quit after 32.00 years of use. He has never used smokeless tobacco. He reports that he does not drink alcohol and does not use drugs.   Family History:   family history includes Arthritis in his mother; Heart disease in an other family member; Hyperlipidemia in his mother; Hypertension in his mother; Stroke in his father.    Review of Systems: Review of Systems  Constitutional: Negative.   HENT: Negative.   Respiratory: Negative.   Cardiovascular: Negative.   Gastrointestinal: Negative.   Musculoskeletal: Negative.        Leg weakness, right arm and right leg  Neurological: Negative.        Weakness right side  Psychiatric/Behavioral: Negative.   All other systems reviewed and are negative.   PHYSICAL EXAM: VS:  BP 120/84 (BP Location: Left Arm, Patient Position: Sitting, Cuff Size: Large)   Pulse 72   Wt 230 lb 4 oz (104.4 kg)   SpO2 96%   BMI 35.01 kg/m  , BMI Body mass index is 35.01 kg/m. Constitutional:  oriented to person, place, and time. No distress.  Presenting in a wheelchair HENT:  Head: Grossly normal Eyes:  no discharge. No scleral icterus.  Neck: No JVD, no carotid bruits  Cardiovascular: Regular rate and rhythm, no murmurs appreciated Pulmonary/Chest: Clear to auscultation bilaterally, no wheezes or rails Abdominal: Soft.  no distension.  no tenderness.   Musculoskeletal: Normal range of motion Neurological:  normal muscle tone. Coordination normal. No atrophy Skin: Skin warm and dry Psychiatric: normal affect, pleasant  Recent Labs: 06/15/2019: ALT 9; BUN 20; Creatinine, Ser 1.46; Hemoglobin 11.7; Platelets 246.0; Potassium 4.5; Sodium 136; TSH 3.02    Lipid Panel Lab Results  Component Value Date   CHOL 104 06/15/2019   HDL 38.30 (L) 06/15/2019   LDLCALC 52 06/15/2019   TRIG 72.0 06/15/2019    Wt Readings from Last 3 Encounters:  03/06/20 230 lb 4 oz (104.4 kg)  12/26/19 226 lb 4 oz (102.6 kg)  09/14/19 221 lb 2 oz (100.3 kg)     ASSESSMENT AND PLAN:  Problem List Items Addressed This Visit      Cardiology Problems   Type 2 diabetes mellitus with vascular disease (HCC)   ATRIAL FIBRILLATION - Primary   Relevant Orders   EKG 12-Lead   Essential hypertension   HYPERCHOLESTEROLEMIA, PURE    Other Visit Diagnoses    Coronary artery disease of native artery of native heart with stable angina  pectoris (HCC)       Relevant Orders   EKG 12-Lead     1.  Atrial fibrillation with RVR Maintaining normal sinus rhythm Continue amiodarone, low-dose metoprolol, anticoagulation on warfarin Consider TSH check with next lab draw  2) CAD With stable angina Prior stent LAD 10 years ago He denies anginal symptoms, very sedentary baseline No further work-up at this time, cholesterol at goal  3) PAD Known carotid disease Cholesterol at goal, aggressive diabetes control Consider repeat ultrasound in follow-up  4.  Hyperlipidemia Numbers at goal, continue current medication   history of stroke History of paroxysmal atrial fibrillation, on warfarin Residual right-sided deficits Walks with hemiwalker, no recent falls  Leg swelling Recommend leg elevation when sitting, could consider compression hose If symptoms get worse may need higher dose diuretic Recommend he moderate fluid intake   Total encounter time more than 25  minutes  Greater than 50% was spent in counseling and coordination of care with the patient    Signed, Dossie Arbour, M.D., Ph.D. [facility] [place], [place] [phone]

## 2020-03-05 NOTE — Telephone Encounter (Signed)
Daughter reports that pt has conjunctivitis and has been treated with abx drops since last Thursday. He wears glasses. I reported since he had been on abx for some time and it is our policy to wear goggles, pt okay to come to visit.   Advised pt to call for any further questions or concerns.

## 2020-03-05 NOTE — Telephone Encounter (Signed)
Per daughter patient being treated for conjunctivitis   Please advise if ok to do in office visit or reschedule.

## 2020-03-06 ENCOUNTER — Ambulatory Visit (INDEPENDENT_AMBULATORY_CARE_PROVIDER_SITE_OTHER): Payer: Medicare (Managed Care) | Admitting: Cardiovascular Disease

## 2020-03-06 ENCOUNTER — Other Ambulatory Visit: Payer: Self-pay

## 2020-03-06 ENCOUNTER — Encounter: Payer: Self-pay | Admitting: Cardiovascular Disease

## 2020-03-06 VITALS — BP 120/84 | HR 72 | Wt 230.2 lb

## 2020-03-06 DIAGNOSIS — E7800 Pure hypercholesterolemia, unspecified: Secondary | ICD-10-CM

## 2020-03-06 DIAGNOSIS — I25118 Atherosclerotic heart disease of native coronary artery with other forms of angina pectoris: Secondary | ICD-10-CM | POA: Diagnosis not present

## 2020-03-06 DIAGNOSIS — E1159 Type 2 diabetes mellitus with other circulatory complications: Secondary | ICD-10-CM | POA: Diagnosis not present

## 2020-03-06 DIAGNOSIS — I10 Essential (primary) hypertension: Secondary | ICD-10-CM | POA: Diagnosis not present

## 2020-03-06 DIAGNOSIS — I480 Paroxysmal atrial fibrillation: Secondary | ICD-10-CM | POA: Diagnosis not present

## 2020-03-06 NOTE — Progress Notes (Signed)
Agree. Thanks

## 2020-03-06 NOTE — Patient Instructions (Signed)
Medication Instructions:  No changes  If you need a refill on your cardiac medications before your next appointment, please call your pharmacy.    Lab work: No new labs needed   If you have labs (blood work) drawn today and your tests are completely normal, you will receive your results only by: . MyChart Message (if you have MyChart) OR . A paper copy in the mail If you have any lab test that is abnormal or we need to change your treatment, we will call you to review the results.   Testing/Procedures: No new testing needed   Follow-Up: At [facility], you and your health needs are our priority.  As part of our continuing mission to provide you with exceptional heart care, we have created designated Provider Care Teams.  These Care Teams include your primary Cardiologist (physician) and Advanced Practice Providers (APPs -  Physician Assistants and Nurse Practitioners) who all work together to provide you with the care you need, when you need it.  . You will need a follow up appointment in 6 months  . Providers on your designated Care Team:   . Christopher Berge, NP . Ryan Dunn, PA-C . Jacquelyn Visser, PA-C  Any Other Special Instructions Will Be Listed Below (If Applicable).  COVID-19 Vaccine Information can be found at: [email or web address] For questions related to vaccine distribution or appointments, please email [email or web address] or call [phone].     

## 2020-03-26 ENCOUNTER — Encounter: Payer: Self-pay | Admitting: Family Medicine

## 2020-03-26 ENCOUNTER — Other Ambulatory Visit: Payer: Self-pay

## 2020-03-26 ENCOUNTER — Ambulatory Visit (INDEPENDENT_AMBULATORY_CARE_PROVIDER_SITE_OTHER): Payer: Medicare (Managed Care) | Admitting: Family Medicine

## 2020-03-26 ENCOUNTER — Ambulatory Visit (INDEPENDENT_AMBULATORY_CARE_PROVIDER_SITE_OTHER): Payer: Medicare (Managed Care)

## 2020-03-26 VITALS — BP 146/70 | HR 64 | Temp 97.5°F | Ht 68.0 in | Wt 228.4 lb

## 2020-03-26 DIAGNOSIS — I4891 Unspecified atrial fibrillation: Secondary | ICD-10-CM

## 2020-03-26 DIAGNOSIS — Z7901 Long term (current) use of anticoagulants: Secondary | ICD-10-CM

## 2020-03-26 DIAGNOSIS — Z23 Encounter for immunization: Secondary | ICD-10-CM

## 2020-03-26 DIAGNOSIS — E1159 Type 2 diabetes mellitus with other circulatory complications: Secondary | ICD-10-CM | POA: Diagnosis not present

## 2020-03-26 DIAGNOSIS — I639 Cerebral infarction, unspecified: Secondary | ICD-10-CM

## 2020-03-26 LAB — POCT INR: INR: 2 (ref 2.0–3.0)

## 2020-03-26 NOTE — Progress Notes (Signed)
This visit occurred during the SARS-CoV-2 public health emergency.  Safety protocols were in place, including screening questions prior to the visit, additional usage of staff PPE, and extensive cleaning of exam room while observing appropriate contact time as indicated for disinfecting solutions.  Diabetes:  No meds.  Hypoglycemic episodes: no sx  Hyperglycemic episodes: no sx.  Feet problems: no Blood Sugars averaging: not checked.   eye exam within last year: due, discussed with patient. A1c 7.2 on 02/06/20.    INR 2 today.  D/w pt.    He was able to walk a short distance recently with supervision.    Covid vaccination encouraged.  Declined. Flu vaccine done at office visit.  Meds, vitals, and allergies reviewed.   ROS: Per HPI unless specifically indicated in ROS section   GEN: nad, alert and oriented, in wheelchair at baseline.  Speech baseline. HEENT: ncat NECK: supple w/o LA CV: sounds to be RRR PULM: ctab, no inc wob ABD: soft, +bs EXT: no edema SKIN: no acute rash  Diabetic foot exam: Normal inspection No skin breakdown No calluses  Normal DP pulses Normal sensation to light touch and monofilament L foot, none on R foot at baseline.   Nails normal

## 2020-03-26 NOTE — Patient Instructions (Signed)
Don't change your meds for now.  Call about an eye exam when possible.  Flu shot today.  Thanks for your effort.   Keep going with monthly INR checks.  Check with Amaryllis Malmquist at an INR visit in about 3 months- schedule both ahead of time.  We can do labs at the visit.  You don't have to fast.    Get a covid shot whenever and wherever you can as you are high risk for a severe covid infection without the vaccine.    Take care.  Glad to see you.

## 2020-03-26 NOTE — Patient Instructions (Addendum)
Pre visit review using our clinic review tool, if applicable. No additional management support is needed unless otherwise documented below in the visit note.  Continue 2mg  daily except take 1mg  on Monday and Thursday.  Recheck in 4 weeks.

## 2020-03-27 NOTE — Assessment & Plan Note (Signed)
No bleeding.  Per anticoagulation clinic.

## 2020-03-27 NOTE — Assessment & Plan Note (Addendum)
No meds.  No change in meds at this point.  A1c 7.2.  Continue work on diet.  Recheck periodically.  See after visit summary.  Discussed.  Flu shot done today.  COVID vaccine encouraged.

## 2020-03-27 NOTE — Progress Notes (Signed)
Agree. Thanks

## 2020-03-28 NOTE — Addendum Note (Signed)
Addended by: Annamarie Major on: 03/28/2020 10:09 AM   Modules accepted: Orders

## 2020-04-23 ENCOUNTER — Other Ambulatory Visit: Payer: Self-pay

## 2020-04-23 ENCOUNTER — Ambulatory Visit (INDEPENDENT_AMBULATORY_CARE_PROVIDER_SITE_OTHER): Payer: Medicare (Managed Care)

## 2020-04-23 DIAGNOSIS — I4891 Unspecified atrial fibrillation: Secondary | ICD-10-CM

## 2020-04-23 DIAGNOSIS — I639 Cerebral infarction, unspecified: Secondary | ICD-10-CM

## 2020-04-23 DIAGNOSIS — Z7901 Long term (current) use of anticoagulants: Secondary | ICD-10-CM | POA: Diagnosis not present

## 2020-04-23 LAB — POCT INR: INR: 1.8 — AB (ref 2.0–3.0)

## 2020-04-23 NOTE — Patient Instructions (Addendum)
Pre visit review using our clinic review tool, if applicable. No additional management support is needed unless otherwise documented below in the visit note.  Increase dose today to 3mg  then continue 2mg  daily except take 1mg  on Monday and Thursday.  Recheck in 4 weeks.

## 2020-04-24 NOTE — Progress Notes (Signed)
Agree. Thanks

## 2020-05-21 ENCOUNTER — Ambulatory Visit: Payer: Medicare (Managed Care)

## 2020-05-28 ENCOUNTER — Other Ambulatory Visit: Payer: Self-pay

## 2020-05-28 ENCOUNTER — Ambulatory Visit (INDEPENDENT_AMBULATORY_CARE_PROVIDER_SITE_OTHER): Payer: Medicare (Managed Care)

## 2020-05-28 DIAGNOSIS — Z7901 Long term (current) use of anticoagulants: Secondary | ICD-10-CM | POA: Diagnosis not present

## 2020-05-28 LAB — POCT INR: INR: 1.9 — AB (ref 2.0–3.0)

## 2020-05-28 NOTE — Patient Instructions (Addendum)
Pre visit review using our clinic review tool, if applicable. No additional management support is needed unless otherwise documented below in the visit note.   Increase dose today to 3mg  then change weekly dose to take 2mg  daily except take 1mg  on Monday.  Recheck in 4 weeks.

## 2020-05-29 NOTE — Progress Notes (Signed)
Agree. Thanks

## 2020-06-25 ENCOUNTER — Other Ambulatory Visit: Payer: Self-pay

## 2020-06-25 ENCOUNTER — Ambulatory Visit (INDEPENDENT_AMBULATORY_CARE_PROVIDER_SITE_OTHER): Payer: Medicare (Managed Care)

## 2020-06-25 ENCOUNTER — Ambulatory Visit (INDEPENDENT_AMBULATORY_CARE_PROVIDER_SITE_OTHER): Payer: Medicare (Managed Care) | Admitting: Family Medicine

## 2020-06-25 ENCOUNTER — Encounter: Payer: Self-pay | Admitting: Family Medicine

## 2020-06-25 VITALS — BP 138/72 | HR 71 | Temp 97.8°F | Ht 68.0 in | Wt 238.0 lb

## 2020-06-25 DIAGNOSIS — L989 Disorder of the skin and subcutaneous tissue, unspecified: Secondary | ICD-10-CM

## 2020-06-25 DIAGNOSIS — E1159 Type 2 diabetes mellitus with other circulatory complications: Secondary | ICD-10-CM

## 2020-06-25 DIAGNOSIS — Z7901 Long term (current) use of anticoagulants: Secondary | ICD-10-CM

## 2020-06-25 DIAGNOSIS — I4891 Unspecified atrial fibrillation: Secondary | ICD-10-CM

## 2020-06-25 DIAGNOSIS — I639 Cerebral infarction, unspecified: Secondary | ICD-10-CM

## 2020-06-25 LAB — POCT GLYCOSYLATED HEMOGLOBIN (HGB A1C): Hemoglobin A1C: 8.1 % — AB (ref 4.0–5.6)

## 2020-06-25 LAB — POCT INR: INR: 1.7 — AB (ref 2.0–3.0)

## 2020-06-25 MED ORDER — METFORMIN HCL 500 MG PO TABS: 500.0000 mg | ORAL_TABLET | Freq: Every day | ORAL | 3 refills | Status: DC

## 2020-06-25 NOTE — Progress Notes (Signed)
This visit occurred during the SARS-CoV-2 public health emergency.  Safety protocols were in place, including screening questions prior to the visit, additional usage of staff PPE, and extensive cleaning of exam room while observing appropriate contact time as indicated for disinfecting solutions.  Diabetes:  No meds.   Hypoglycemic episodes: no sx  Hyperglycemic episodes: no sx Feet problems: no Blood Sugars averaging: not checked.   eye exam within last year: due, d/w pt.   A1c 8.1.  He cut out cereal and OJ and A1c is still up.    He needs a letter to keep his cat.  D/w pt.  It provides companionship.  Letter done for patient.  INR 1.7.  This was addressed.  See following note.    We talked about sending an order for a diabetic meter and a prescription for metformin to [facility].  We will check on this.  Meds, vitals, and allergies reviewed.   ROS: Per HPI unless specifically indicated in ROS section   GEN: nad, alert and oriented, in wheelchair at baseline. HEENT: ncat NECK: supple w/o LA CV: rrr. PULM: ctab, no inc wob ABD: soft, +bs EXT w/o edema.  SKIN: no acute rash but irritated 1.5cm lesion on the R triceps.

## 2020-06-25 NOTE — Patient Instructions (Addendum)
We'll see about getting a meter and metformin for you.   When you get the metformin, start with 1 tab a day.  Plan on recheck in about 3 months with labs at the visit.  Take care.  Glad to see you.  We'll call about seeing dermatology.

## 2020-06-25 NOTE — Patient Instructions (Addendum)
Pre visit review using our clinic review tool, if applicable. No additional management support is needed unless otherwise documented below in the visit note.  Increase dose today to 4mg  then change weekly dose to take 2mg  daily.  Recheck in 3 weeks.

## 2020-06-26 ENCOUNTER — Encounter: Payer: Self-pay | Admitting: Family Medicine

## 2020-06-26 DIAGNOSIS — L989 Disorder of the skin and subcutaneous tissue, unspecified: Secondary | ICD-10-CM | POA: Insufficient documentation

## 2020-06-26 NOTE — Assessment & Plan Note (Signed)
See anticoagulation note 

## 2020-06-26 NOTE — Progress Notes (Signed)
Agree. Thanks

## 2020-06-26 NOTE — Assessment & Plan Note (Addendum)
We talked about sending an order for a diabetic meter and a prescription for metformin to [facility].  We will check on this.    A1c 8.1.  He cut out cereal and OJ and A1c is still up.    He will work on maintaining a low carbohydrate diet, we will check on his Metformin prescription, and we can recheck periodically.  See after visit summary.  He agrees to plan.

## 2020-06-26 NOTE — Assessment & Plan Note (Signed)
Refer to dermatology.  This may just be an irritated seborrheic keratosis but I want dermatology input and I would prefer not to shave it off given his anticoagulation.  Discussed with patient.  He agrees.

## 2020-07-02 ENCOUNTER — Telehealth: Payer: Self-pay

## 2020-07-02 NOTE — Telephone Encounter (Signed)
Gabriel Rung, CMA  Joaquim Nam, MD Spoke with [facility] over at [facility] and they will put the order in for the metformin, diabetic meter/strips and will notify the pt's daughter.   Thank you,   Christy Gentles

## 2020-07-02 NOTE — Telephone Encounter (Signed)
Spoke with the [facility] they connected me with [facility] and they will put the order in for the Metformin and diabetic meter/strips and contact the pt's daughter.  [facility] [phone]  Maryann Alar [phone]

## 2020-07-03 ENCOUNTER — Encounter: Payer: Self-pay | Admitting: Family Medicine

## 2020-07-03 NOTE — Telephone Encounter (Signed)
[facility] called and stated that the glucose meter, test strips and metformin will be delivered to his home tomorrow.

## 2020-07-04 NOTE — Telephone Encounter (Signed)
Thanks

## 2020-07-16 ENCOUNTER — Ambulatory Visit (INDEPENDENT_AMBULATORY_CARE_PROVIDER_SITE_OTHER): Payer: Medicare (Managed Care) | Admitting: Family Medicine

## 2020-07-16 ENCOUNTER — Other Ambulatory Visit: Payer: Self-pay

## 2020-07-16 DIAGNOSIS — I639 Cerebral infarction, unspecified: Secondary | ICD-10-CM

## 2020-07-16 DIAGNOSIS — Z7901 Long term (current) use of anticoagulants: Secondary | ICD-10-CM

## 2020-07-16 DIAGNOSIS — I4891 Unspecified atrial fibrillation: Secondary | ICD-10-CM

## 2020-07-16 LAB — POCT INR: INR: 1.9 — AB (ref 2.0–3.0)

## 2020-07-16 NOTE — Patient Instructions (Signed)
Pre visit review using our clinic review tool, if applicable. No additional management support is needed unless otherwise documented below in the visit note. 

## 2020-07-17 NOTE — Progress Notes (Signed)
Agree. Thanks

## 2020-07-30 ENCOUNTER — Ambulatory Visit (INDEPENDENT_AMBULATORY_CARE_PROVIDER_SITE_OTHER): Payer: Medicare (Managed Care)

## 2020-07-30 ENCOUNTER — Encounter: Payer: Self-pay | Admitting: Family Medicine

## 2020-07-30 ENCOUNTER — Other Ambulatory Visit: Payer: Self-pay

## 2020-07-30 DIAGNOSIS — I4891 Unspecified atrial fibrillation: Secondary | ICD-10-CM | POA: Diagnosis not present

## 2020-07-30 DIAGNOSIS — Z7901 Long term (current) use of anticoagulants: Secondary | ICD-10-CM

## 2020-07-30 DIAGNOSIS — I639 Cerebral infarction, unspecified: Secondary | ICD-10-CM

## 2020-07-30 LAB — POCT INR: INR: 4.4 — AB (ref 2.0–3.0)

## 2020-07-30 NOTE — Patient Instructions (Addendum)
Hold X 2 days (1/25) and 1/26) and then decrease dosing to 2mg  daily EXCEPT for 1mg  (1/2 pill) on Mondays and Fridays and recheck in 1.5 weeks.  Patient level is supratherapeutic.  Patient/family denies any bruising or bleeding and knows to go to ER if any concerning symptoms develop.  Patient's caregiver was present and I did review all changes and she verbalized understanding.

## 2020-07-31 NOTE — Progress Notes (Signed)
Agree. Thanks

## 2020-08-01 NOTE — Telephone Encounter (Signed)
Please check with patient/family.  See if they have been able to check your sugar in the meantime.  I would have them stop the Metformin for now and keep checking his sugar.  Please see if they can get him in for an office visit so we can check his skin.  Thanks.

## 2020-08-02 ENCOUNTER — Telehealth: Payer: Self-pay | Admitting: Family Medicine

## 2020-08-02 NOTE — Telephone Encounter (Signed)
Is there a way we can line up his Coumadin appointment and have an appointment with me?  Please talk to me about this.  Thanks.

## 2020-08-02 NOTE — Telephone Encounter (Signed)
Dr Lajuana Matte called from [facility] she is calling stating that she has a urgent matter to discuss. She will also be sending over a letter to you about this patient and ask that you give her a call if you have any questions. EM Her number is [phone]

## 2020-08-04 NOTE — Telephone Encounter (Signed)
Late entry.  Call from Dr. Victory Dakin.  I was able to call her back a few minutes after her incoming call.  We talked about the patient's situation.  Apparently he has been having respiratory symptoms.  I was unaware of this until she called me with an update.  She understood that I had no advanced knowledge of his symptoms.  She was going to go out and evaluate the patient.  I greatly appreciate that.  We talked about his situation and his baseline Coumadin dosing.  If she does need to start him on antibiotics she can consider decreasing his Coumadin dose by around 50%.  She can call me on my cell phone if needed or she can send a note as an update next week.  I greatly appreciate her help.  I will defer for now.

## 2020-08-05 LAB — BASIC METABOLIC PANEL
Creatinine: 1.4 — AB (ref <=1.3)
Creatinine: 1.4 — AB (ref <=1.3)
Glucose: 172
Glucose: 172

## 2020-08-05 LAB — CBC AND DIFFERENTIAL
HCT: 33 — AB (ref 41–53)
Hemoglobin: 11.6 — AB (ref 13.5–17.5)
Hemoglobin: 11.6 — AB (ref 13.5–17.5)
Platelets: 215 (ref 150–399)
Platelets: 215 (ref 150–399)
WBC: 4.4
WBC: 4.4

## 2020-08-05 LAB — HEPATIC FUNCTION PANEL
ALT: 16 (ref 10–40)
ALT: 16 (ref 10–40)
AST: 27 (ref 14–40)
AST: 27 (ref 14–40)

## 2020-08-05 LAB — COMPREHENSIVE METABOLIC PANEL: Albumin: 3.5 (ref 3.5–5.0)

## 2020-08-05 LAB — TSH
TSH: 1.72 (ref <=5.90)
TSH: 1.72 (ref <=5.90)

## 2020-08-05 LAB — HEMOGLOBIN A1C
Hemoglobin A1C: 7.2
Hemoglobin A1C: 7.2

## 2020-08-05 LAB — VITAMIN D 25 HYDROXY (VIT D DEFICIENCY, FRACTURES)
Vit D, 25-Hydroxy: 41.2
Vit D, 25-Hydroxy: 41.2

## 2020-08-07 ENCOUNTER — Ambulatory Visit: Payer: Self-pay

## 2020-08-07 NOTE — Telephone Encounter (Signed)
Darrell Hayes returned call. Pt was started on cipro on 1/28, BID, 7 days.  She has changed dosing to 1mg  daily because they do not have 1mg  tables. They only have 2 and 3 mg tablets. So pt normal dosing is 2mg  daily except 1mg  on Mon and Fri. Dr. reduced dose to 1mg  daily so she has changed dosing to 1mg  daily. They will be in tomorrow at 445 for INR check.

## 2020-08-07 NOTE — Telephone Encounter (Signed)
If he is currently symptomatic and needing to start antibiotics then this should be a car side INR.  Please talk to me about who is doing the testing tomorrow.

## 2020-08-07 NOTE — Telephone Encounter (Signed)
LVM with daughter and Verlon Au to see if  Pt was placed on abx. Discussed with PCP and he cannot see pt when he is scheduled to come in for coumadin clinic tomorrow at 445. Will see what treatment he has had from Dr. Victory Dakin and see if we can also get him scheduled to see PCP in the near future.  Please send msg to an RN in the clinic.

## 2020-08-07 NOTE — Progress Notes (Signed)
Updating dosage decrease per Dr. Para March since pt started on cipro on 1/28. Pt is to cut dosage by 50%

## 2020-08-08 ENCOUNTER — Other Ambulatory Visit: Payer: Self-pay

## 2020-08-08 ENCOUNTER — Telehealth: Payer: Self-pay

## 2020-08-08 ENCOUNTER — Ambulatory Visit (INDEPENDENT_AMBULATORY_CARE_PROVIDER_SITE_OTHER): Payer: Medicare (Managed Care)

## 2020-08-08 DIAGNOSIS — Z7901 Long term (current) use of anticoagulants: Secondary | ICD-10-CM

## 2020-08-08 LAB — POCT INR: INR: 2.8 (ref 2.0–3.0)

## 2020-08-08 NOTE — Telephone Encounter (Signed)
Dr. Lajuana Matte of [facility] lvm.  States she has tried several times to fax recent lab results but won't go through.  Says she will mail results but wants to make Dr. Para March aware pt's DM and kidney function is stable, phosphorus mildly low and his liver looks great.  Also, states she is referring pt to GI.  Dr. Victory Dakin can be contacted at [phone] with any questions/concerns.

## 2020-08-08 NOTE — Patient Instructions (Signed)
Pre visit review using our clinic review tool, if applicable. No additional management support is needed unless otherwise documented below in the visit note. 

## 2020-08-08 NOTE — Telephone Encounter (Signed)
Darrell Hayes will be doing this and is already aware

## 2020-08-09 ENCOUNTER — Encounter: Payer: Self-pay | Admitting: Surgery

## 2020-08-09 ENCOUNTER — Ambulatory Visit (INDEPENDENT_AMBULATORY_CARE_PROVIDER_SITE_OTHER): Payer: Medicare (Managed Care) | Admitting: Surgery

## 2020-08-09 ENCOUNTER — Other Ambulatory Visit: Payer: Self-pay

## 2020-08-09 VITALS — BP 130/69 | HR 80 | Temp 98.3°F | Ht 68.0 in | Wt 238.0 lb

## 2020-08-09 DIAGNOSIS — R1011 Right upper quadrant pain: Secondary | ICD-10-CM

## 2020-08-09 NOTE — Telephone Encounter (Signed)
Noted. Thanks.

## 2020-08-09 NOTE — Patient Instructions (Signed)
Please call if you have any questions or concerns.  °

## 2020-08-11 ENCOUNTER — Encounter: Payer: Self-pay | Admitting: Surgery

## 2020-08-11 NOTE — Progress Notes (Signed)
Agree. Thanks

## 2020-08-11 NOTE — Progress Notes (Signed)
08/09/2020  History of Present Illness: Darrell Hayes is a 80 y.o. male with a history of acute cholecystitis s/p percutaneous cholecystostomy tube on 12/23/18.  Drain was removed on 02/10/19 due to malpositioning / malfunctioning.  He presents today because over the past couple of weeks, he's had decreased appetite and there was mention of possible RUQ pain on exam by his physician.  The patient's daughter is with him and she says that he recently had an episode of pneumonia with coughing.    When asking the patient, he reports he's doing well, and denies any abdominal pain, nausea, or vomiting.  He does have some dementia.  Past Medical History: Past Medical History:  Diagnosis Date  . Agitation    and irritability, situational exacerbation  . Alveolar/parietoalveolar pneumonopathy, other    06/14/09 CT chest, Mild LLL GGO; 02/28/10 CT chest , improved  . Atrial fibrillation (HCC)    Coumadin therapy - Dr. Jens Som.  Cardioversion 08/23/09, Amiodarone Rx since 09/03/2009  . CAD (coronary artery disease) 09/2008   Dr. Jens Som, stent to LAD  . CVA (cerebral vascular accident) (HCC) 2007   In the setting of atrial fibrillation  . Dyspnea 09/2008   Class 3, since 2008.  Significant tachycardia.  No PE or fibrosis on CT chest 3/10, 12/10, 02/2010, normal PFT but isolated low DLCO  06/2009, normal hgb, TSH, creat and albumin , Dec 2010.  Resolved after A Fib cardioversion and Amiodarone Marh 2011  . Hepatitis   . Hiatal hernia   . History of chickenpox   . Hyperlipidemia   . Hypertension   . Nonspecific abnormal results of pulmonary system function study   . Pulmonary nodules 09/2008   Nonspecific reticulonodular nodules RLL superior segment, resolved on CT 06/14/09  . Seizures (HCC) 12/2008   Dr. Sharene Skeans     Past Surgical History: Past Surgical History:  Procedure Laterality Date  . APPENDECTOMY    . IR PERC CHOLECYSTOSTOMY  12/23/2018  . Stent placed  09/2008    Home Medications: Prior  to Admission medications   Medication Sig Start Date End Date Taking? Authorizing Provider  albuterol (PROVENTIL) (2.5 MG/3ML) 0.083% nebulizer solution Take 2.5 mg by nebulization every 4 (four) hours as needed for wheezing or shortness of breath.   Yes [provider]  amiodarone (PACERONE) 200 MG tablet Take 1 tablet (200 mg total) by mouth daily. 09/14/19  Yes Joaquim Nam, MD  cholecalciferol (VITAMIN D) 1000 UNITS tablet Take 1,000 Units by mouth daily.   Yes [provider]  docusate sodium (COLACE) 100 MG capsule Take 100 mg by mouth daily.   Yes [provider]  levothyroxine (SYNTHROID) 50 MCG tablet Take 50 mcg by mouth every evening.   Yes [provider]  metFORMIN (GLUCOPHAGE) 500 MG tablet Take 1 tablet (500 mg total) by mouth daily with breakfast. 06/25/20  Yes Joaquim Nam, MD  metoprolol succinate (TOPROL-XL) 25 MG 24 hr tablet Take 0.5 tablets (12.5 mg total) by mouth daily. Take with or immediately following a meal. 09/14/19  Yes Joaquim Nam, MD  nystatin (MYCOSTATIN/NYSTOP) powder Apply 1 application topically daily as needed. 09/14/19  Yes Joaquim Nam, MD  rosuvastatin (CRESTOR) 20 MG tablet Take 1 tablet (20 mg total) by mouth daily. 04/14/11  Yes Joaquim Nam, MD  tamsulosin (FLOMAX) 0.4 MG CAPS capsule Take 0.8 mg by mouth at bedtime.   Yes [provider]  warfarin (COUMADIN) 2 MG tablet Take 2mg  by mouth  Tues, Thurs, and Sat or as directed by coumadin clinic. 10/10/19  Yes Joaquim Nam, MD  warfarin (COUMADIN) 3 MG tablet Take 3 mg by mouth Mon, Wed, Fri and Sun or as directed by coumadin clinic. 10/10/19  Yes Joaquim Nam, MD    Allergies: Allergies  Allergen Reactions  . Tamiflu [Oseltamivir]     Review of Systems: Review of Systems  Constitutional: Negative for chills and fever.  Respiratory: Positive for cough.   Cardiovascular: Negative for chest pain.  Gastrointestinal: Positive for  abdominal pain (RUQ). Negative for nausea and vomiting.    Physical Exam BP 130/69   Pulse 80   Temp 98.3 F (36.8 C) (Oral)   Ht 5\' 8"  (1.727 m)   Wt 238 lb (108 kg)   SpO2 95%   BMI 36.19 kg/m  CONSTITUTIONAL: No acute distress HEENT:  Normocephalic, atraumatic, extraocular motion intact. RESPIRATORY: Normal respiratory effort without pathologic use of accessory muscles.  Decreased breath sounds at bilateral bases. CARDIOVASCULAR: Heart is regular without murmurs, gallops, or rubs. GI: The abdomen is soft, non-distended, currently non-tender to palpation. NEUROLOGIC:  Motor and sensation is grossly normal.  Cranial nerves are grossly intact. PSYCH:  Alert and oriented to person, place and time. Affect is normal.  Assessment and Plan: This is a 80 y.o. male with history of acute cholecystitis.  --Given the patient's history, his family and physician wanted him to be evaluated.  I think his recent pneumonia is a confounding factor, as this could definitely result in weakness and decreased appetite.  On my exam, there is really no RUQ significant pain and is negative for Murphy's sign.  Depending on where his pneumonia was located, this could also result is some RUQ referred pain.  At this point, he does not seem to be having issues with biliary colic. --Continue diet as tolerated, and follow up if any worsening signs or symptoms.  Face-to-face time spent with the patient and care providers was 15 minutes, with more than 50% of the time spent counseling, educating, and coordinating care of the patient.     76, MD [facility]

## 2020-08-13 ENCOUNTER — Telehealth: Payer: Self-pay

## 2020-08-13 NOTE — Telephone Encounter (Signed)
Patients daughter called back and stated that they were unable to bring him on Thursday, but Tuesday (2/15) at 3:30 would work. Instructed daughter that since patient is finished with his antibiotics, he will resume normal dosing of Coumadin to take 2 mg everyday EXCEPT 1 mg on Mondays and Fridays. Daughter verbalized understanding. Also, daughter stated that the MD at [facility] placed the patient on Iron pills and wanted to check with Dr. Para March to make sure that this is ok? Daughter stated that we could send her a MyChart message with this response. Informed daughter to make sure patient did not take Iron and Coumadin together D/T their interaction. Patients daughter verbalized understanding.

## 2020-08-13 NOTE — Telephone Encounter (Signed)
Called and LVM for patients daughter to call back and schedule appointment with coumadin clinic this Thursday (2/10) to re-check INR. Instructed daughter to call office back to get this set-up.

## 2020-08-14 ENCOUNTER — Other Ambulatory Visit: Payer: Self-pay

## 2020-08-14 MED ORDER — IRON 325 (65 FE) MG PO TABS: ORAL_TABLET | ORAL | 0 refills | Status: DC

## 2020-08-14 NOTE — Telephone Encounter (Signed)
Sent patient mychart message with response as requested by patients daughter. I also updated med list.

## 2020-08-14 NOTE — Telephone Encounter (Signed)
Okay with me about taking iron, just do not take it at the same time as the Coumadin.  Please update her.  Please update med list.  Thanks.

## 2020-08-15 ENCOUNTER — Encounter: Payer: Self-pay | Admitting: Family Medicine

## 2020-08-16 ENCOUNTER — Telehealth: Payer: Self-pay | Admitting: Family Medicine

## 2020-08-16 NOTE — Telephone Encounter (Signed)
Left message to call office. Is he having shortness of breath, coughing anything up? If coughing stuff up, what color is it?

## 2020-08-16 NOTE — Telephone Encounter (Signed)
See phone note

## 2020-08-16 NOTE — Telephone Encounter (Signed)
Apparently he was recently treated for PNA with some antibiotic by [facility] MD.  If not productive cough and no fevers/chills, don't think he needs another abx course. Would recommend trial OTC cough suppressant over weekend like deslym or robitussin.  Update [place] Monday with effect.  Any pedal edema, any unexpected weight gain? He is not on lasix, I don't see h/o CHF in chart.   Wt Readings from Last 3 Encounters:  08/09/20 238 lb (108 kg)  06/25/20 238 lb (108 kg)  03/26/20 228 lb 6 oz (103.6 kg)

## 2020-08-16 NOTE — Telephone Encounter (Addendum)
Please triage patient.  See phone mychart message below.  Thanks. Please call me if needed.  =================  Dr. Penelope Coop morning hope all is well with you. I have a concern about daddy cough he has finishes the antibiotics that Dr. Purcell Mouton gave him this past Monday. But still has a cough some during the day and night it is a deep cough that is associated with the pneumonia please advise what we can give him that will help him with this and that will not affect his Coumadin  Do you think that another round of antibiotics would help I know we have had to do it before. Please advise what we can do in this matter. Dr. Purcell Mouton emailed about his visit with Dr. Gwenevere Abbot and I let her know about the cough she suggested to give him honey at night or hot tea with honey.    Thank you  Lonni Fix

## 2020-08-16 NOTE — Telephone Encounter (Signed)
He gets short of breath when he walks to the bathroom. He is not coughing anything up. Usually a dry cough. When it is a wet cough, he is not coughing anything up. She said he is waking her up every 2 hours to get up to go to the bathroom. Send any meds to [facility] [address]

## 2020-08-16 NOTE — Telephone Encounter (Signed)
Contacted pt's daughter, Lonni Fix, and advised of cough medicine. She denies any edema or weight gain. Scheduled f/u apt with PCP for 2/17 per PCP request earlier this month and [facility] coumadin clinic apt from 2/15 to 2/17 when he is her for OV apt at 3pm. Advised if any fever/chills over the weekend bring pt to UC to be assessed. Advised if any changes contact office next week. Larrelle verbalized understanding.

## 2020-08-18 NOTE — Telephone Encounter (Signed)
Noted. Thanks.

## 2020-08-20 ENCOUNTER — Ambulatory Visit: Payer: Medicare (Managed Care)

## 2020-08-22 ENCOUNTER — Ambulatory Visit (INDEPENDENT_AMBULATORY_CARE_PROVIDER_SITE_OTHER): Payer: Medicare (Managed Care)

## 2020-08-22 ENCOUNTER — Ambulatory Visit (INDEPENDENT_AMBULATORY_CARE_PROVIDER_SITE_OTHER): Payer: Medicare (Managed Care) | Admitting: Family Medicine

## 2020-08-22 ENCOUNTER — Encounter: Payer: Self-pay | Admitting: Family Medicine

## 2020-08-22 ENCOUNTER — Other Ambulatory Visit: Payer: Self-pay

## 2020-08-22 DIAGNOSIS — R238 Other skin changes: Secondary | ICD-10-CM | POA: Diagnosis not present

## 2020-08-22 DIAGNOSIS — Z7901 Long term (current) use of anticoagulants: Secondary | ICD-10-CM

## 2020-08-22 DIAGNOSIS — R059 Cough, unspecified: Secondary | ICD-10-CM

## 2020-08-22 DIAGNOSIS — E1159 Type 2 diabetes mellitus with other circulatory complications: Secondary | ICD-10-CM

## 2020-08-22 DIAGNOSIS — I639 Cerebral infarction, unspecified: Secondary | ICD-10-CM

## 2020-08-22 DIAGNOSIS — I4891 Unspecified atrial fibrillation: Secondary | ICD-10-CM

## 2020-08-22 LAB — POCT INR: INR: 2.4 (ref 2.0–3.0)

## 2020-08-22 MED ORDER — IRON 325 (65 FE) MG PO TABS: ORAL_TABLET | ORAL | 0 refills | Status: AC

## 2020-08-22 NOTE — Progress Notes (Signed)
Agree. Thanks

## 2020-08-22 NOTE — Patient Instructions (Addendum)
Pre visit review using our clinic review tool, if applicable. No additional management support is needed unless otherwise documented below in the visit note.  Continue taking current dose of 2 mg everyday EXCEPT 1 mg on Monday and Friday. Re-check in  4 weeks.

## 2020-08-22 NOTE — Patient Instructions (Signed)
Since your sugars are reasonable, I would stay off metformin for now.  Use barrier cream on your backside and try to limit prolonged sitting without adjustment.   Plan on recheck A1c at a visit in about 3 months.  Continue with INRs in the interval as scheduled.  Let me know about the [facility] meeting.  Your lungs are clear.  Use albuterol as needed for the cough.  Take care.  Glad to see you.

## 2020-08-22 NOTE — Progress Notes (Signed)
This visit occurred during the SARS-CoV-2 public health emergency.  Safety protocols were in place, including screening questions prior to the visit, additional usage of staff PPE, and extensive cleaning of exam room while observing appropriate contact time as indicated for disinfecting solutions.  Follow-up.  Cough is ongoing, but better today.  SABA use last night helped.  He is done with abx.  INR 2.4 today.  No bleeding.   He has been seeing others, talking about death, confused, since recently illness started. No fevers.  His abd wall is sore, noted with coughing.    Off metformin in the meantime.  A1c 7.2 most recently.  Sugars are controlled in the meantime, usually 115-125.    Skin lesion on bottom, some improvement in the meantime.  He is sitting most of the time.  Family is going to check with [facility] to see about getting extra services.  Covid vaccine previously advised and declined.  Meds, vitals, and allergies reviewed.   ROS: Per HPI unless specifically indicated in ROS section   In wheelchair. Speech at baseline. nad ncat Neck supple, no LA rrr ctab Abdominal wall appears to be slightly sore but I do not suspect intra-abdominal process causing his discomfort. B nickle sized symmetric areas on the buttocks, irritated but not ulceration.   L knee puffy anterior 2 scabs R lateral knee No BLE edema.    30 minutes were devoted to patient care in this encounter (this includes time spent reviewing the patient's file/history, interviewing and examining the patient, counseling/reviewing plan with patient).

## 2020-08-25 DIAGNOSIS — R059 Cough, unspecified: Secondary | ICD-10-CM | POA: Insufficient documentation

## 2020-08-25 NOTE — Assessment & Plan Note (Signed)
Cough improved in the meantime.  He did have increased confusion when he was sick.  We will see if his mentation clears in the next little bit.  I would expect that his mentation would clear as he continues to improve.  Would use albuterol as needed for the cough.  Off antibiotics at this point and INR is reasonable.  I suspect his abdominal wall is sore from coughing but this should improve.

## 2020-08-25 NOTE — Assessment & Plan Note (Signed)
Reasonable to use barrier cream and try to limit pressure/prolonged sitting without weight adjustment.  Family is going to check with the [facility] to see if they can get extra services.

## 2020-08-25 NOTE — Assessment & Plan Note (Signed)
I would continue to hold Metformin for now.  Plan on recheck A1c in about 3 months with family to update me as needed in the meantime.

## 2020-08-28 ENCOUNTER — Encounter: Payer: Self-pay | Admitting: Family Medicine

## 2020-09-01 ENCOUNTER — Telehealth: Payer: Self-pay | Admitting: Family Medicine

## 2020-09-01 NOTE — Telephone Encounter (Signed)
Please triage pain about recent fall and knee pain.  Thanks.

## 2020-09-02 NOTE — Telephone Encounter (Signed)
Noted. Thanks.

## 2020-09-02 NOTE — Telephone Encounter (Signed)
Contacted pt's daughter, Lonni Fix, who reports pt has diagnosis of UTI again and prescribed keflex, 500mg  BID, x 7 days.  Pt is not complaining as much about knee pain, it is still very bruised but x-ray showed no fractures. She reports it is diffcult to get him to walk because he says it hurts but she has been getting him up Q1H. She is giving him tylenol at bedtime and using heat which he says helps.  She wanted PCP to know pt has not been approved for anymore home care and [facility] said he will need to go there for rehab. She does not want him to go there due to the risk of him being around so many other people. She is not sure if she will allow him to go or not.  Advised if anything changes or she needs anything to contact the office. Advised a msg with this info would be sent to PCP. Advised no changes in warfarin dosing at this time. Larrelle verbalized understanding.

## 2020-09-04 ENCOUNTER — Ambulatory Visit: Payer: Medicare (Managed Care) | Admitting: Cardiovascular Disease

## 2020-09-17 ENCOUNTER — Telehealth: Payer: Self-pay | Admitting: Cardiovascular Disease

## 2020-09-17 ENCOUNTER — Other Ambulatory Visit: Payer: Self-pay

## 2020-09-17 ENCOUNTER — Ambulatory Visit (INDEPENDENT_AMBULATORY_CARE_PROVIDER_SITE_OTHER): Payer: Medicare (Managed Care)

## 2020-09-17 DIAGNOSIS — Z7901 Long term (current) use of anticoagulants: Secondary | ICD-10-CM | POA: Diagnosis not present

## 2020-09-17 DIAGNOSIS — I4891 Unspecified atrial fibrillation: Secondary | ICD-10-CM

## 2020-09-17 DIAGNOSIS — I639 Cerebral infarction, unspecified: Secondary | ICD-10-CM

## 2020-09-17 LAB — POCT INR: INR: 1.5 — AB (ref 2.0–3.0)

## 2020-09-17 NOTE — Telephone Encounter (Signed)
Per [facility] not needed .  Removing recall.

## 2020-09-17 NOTE — Patient Instructions (Addendum)
Pre visit review using our clinic review tool, if applicable. No additional management support is needed unless otherwise documented below in the visit note.  Increase dose today to 4 mg and then continue taking current dose of 2 mg everyday EXCEPT 1 mg on Monday and Friday. Re-check in  2 weeks.

## 2020-09-18 NOTE — Progress Notes (Signed)
Agree. Thanks

## 2020-10-01 ENCOUNTER — Ambulatory Visit (INDEPENDENT_AMBULATORY_CARE_PROVIDER_SITE_OTHER): Payer: Medicare (Managed Care)

## 2020-10-01 ENCOUNTER — Other Ambulatory Visit: Payer: Self-pay

## 2020-10-01 DIAGNOSIS — Z7901 Long term (current) use of anticoagulants: Secondary | ICD-10-CM | POA: Diagnosis not present

## 2020-10-01 DIAGNOSIS — I639 Cerebral infarction, unspecified: Secondary | ICD-10-CM

## 2020-10-01 DIAGNOSIS — I4891 Unspecified atrial fibrillation: Secondary | ICD-10-CM

## 2020-10-01 LAB — POCT INR: INR: 1.4 — AB (ref 2.0–3.0)

## 2020-10-01 NOTE — Patient Instructions (Addendum)
Pre visit review using our clinic review tool, if applicable. No additional management support is needed unless otherwise documented below in the visit note.  Increase dose today to 4 mg and then change your weekly dose to take 2 mg (1 tablet everyday. Re-check in 3 weeks.

## 2020-10-02 NOTE — Progress Notes (Signed)
Agree. Thanks

## 2020-10-13 ENCOUNTER — Encounter: Payer: Self-pay | Admitting: Family Medicine

## 2020-10-22 ENCOUNTER — Ambulatory Visit: Payer: Medicare (Managed Care)

## 2020-10-29 ENCOUNTER — Ambulatory Visit (INDEPENDENT_AMBULATORY_CARE_PROVIDER_SITE_OTHER): Payer: Medicare (Managed Care)

## 2020-10-29 ENCOUNTER — Other Ambulatory Visit: Payer: Self-pay

## 2020-10-29 DIAGNOSIS — Z7901 Long term (current) use of anticoagulants: Secondary | ICD-10-CM | POA: Diagnosis not present

## 2020-10-29 LAB — POCT INR: INR: 1.4 — AB (ref 2.0–3.0)

## 2020-10-29 NOTE — Patient Instructions (Addendum)
Pre visit review using our clinic review tool, if applicable. No additional management support is needed unless otherwise documented below in the visit note.  Increase dose today to 4 mg and then change your weekly dose to take 2 mg (1 tablet everyday EXCEPT take 4 mg (2 tablets) on Sundays. Re-check in 4 weeks per patient request.

## 2020-10-30 NOTE — Progress Notes (Signed)
Agree. Thanks

## 2020-11-06 ENCOUNTER — Telehealth: Payer: Self-pay

## 2020-11-06 ENCOUNTER — Emergency Department: Payer: Medicare (Managed Care)

## 2020-11-06 ENCOUNTER — Other Ambulatory Visit: Payer: Self-pay

## 2020-11-06 ENCOUNTER — Inpatient Hospital Stay
Admission: EM | Admit: 2020-11-06 | Discharge: 2020-12-04 | DRG: 951 | Disposition: E | Payer: Medicare (Managed Care) | Attending: Internal Medicine | Admitting: Internal Medicine

## 2020-11-06 ENCOUNTER — Emergency Department (HOSPITAL_BASED_OUTPATIENT_CLINIC_OR_DEPARTMENT_OTHER): Payer: Medicare (Managed Care)

## 2020-11-06 DIAGNOSIS — E669 Obesity, unspecified: Secondary | ICD-10-CM | POA: Diagnosis present

## 2020-11-06 DIAGNOSIS — E119 Type 2 diabetes mellitus without complications: Secondary | ICD-10-CM | POA: Diagnosis present

## 2020-11-06 DIAGNOSIS — G932 Benign intracranial hypertension: Secondary | ICD-10-CM | POA: Diagnosis present

## 2020-11-06 DIAGNOSIS — Z6831 Body mass index (BMI) 31.0-31.9, adult: Secondary | ICD-10-CM | POA: Diagnosis not present

## 2020-11-06 DIAGNOSIS — G935 Compression of brain: Secondary | ICD-10-CM | POA: Diagnosis present

## 2020-11-06 DIAGNOSIS — Z66 Do not resuscitate: Secondary | ICD-10-CM | POA: Diagnosis present

## 2020-11-06 DIAGNOSIS — G9341 Metabolic encephalopathy: Secondary | ICD-10-CM | POA: Diagnosis present

## 2020-11-06 DIAGNOSIS — I69351 Hemiplegia and hemiparesis following cerebral infarction affecting right dominant side: Secondary | ICD-10-CM | POA: Diagnosis not present

## 2020-11-06 DIAGNOSIS — I10 Essential (primary) hypertension: Secondary | ICD-10-CM | POA: Diagnosis present

## 2020-11-06 DIAGNOSIS — J9601 Acute respiratory failure with hypoxia: Secondary | ICD-10-CM | POA: Diagnosis present

## 2020-11-06 DIAGNOSIS — I6200 Nontraumatic subdural hemorrhage, unspecified: Secondary | ICD-10-CM | POA: Diagnosis present

## 2020-11-06 DIAGNOSIS — G40909 Epilepsy, unspecified, not intractable, without status epilepticus: Secondary | ICD-10-CM | POA: Diagnosis present

## 2020-11-06 DIAGNOSIS — K219 Gastro-esophageal reflux disease without esophagitis: Secondary | ICD-10-CM | POA: Diagnosis present

## 2020-11-06 DIAGNOSIS — F0390 Unspecified dementia without behavioral disturbance: Secondary | ICD-10-CM | POA: Diagnosis present

## 2020-11-06 DIAGNOSIS — S065X9A Traumatic subdural hemorrhage with loss of consciousness of unspecified duration, initial encounter: Secondary | ICD-10-CM | POA: Diagnosis not present

## 2020-11-06 DIAGNOSIS — R29731 NIHSS score 31: Secondary | ICD-10-CM | POA: Diagnosis present

## 2020-11-06 DIAGNOSIS — Z8249 Family history of ischemic heart disease and other diseases of the circulatory system: Secondary | ICD-10-CM | POA: Diagnosis not present

## 2020-11-06 DIAGNOSIS — Z7189 Other specified counseling: Secondary | ICD-10-CM | POA: Diagnosis not present

## 2020-11-06 DIAGNOSIS — Z20822 Contact with and (suspected) exposure to covid-19: Secondary | ICD-10-CM | POA: Diagnosis present

## 2020-11-06 DIAGNOSIS — I639 Cerebral infarction, unspecified: Secondary | ICD-10-CM | POA: Diagnosis not present

## 2020-11-06 DIAGNOSIS — Z87891 Personal history of nicotine dependence: Secondary | ICD-10-CM | POA: Diagnosis not present

## 2020-11-06 DIAGNOSIS — Z83438 Family history of other disorder of lipoprotein metabolism and other lipidemia: Secondary | ICD-10-CM

## 2020-11-06 DIAGNOSIS — Z888 Allergy status to other drugs, medicaments and biological substances status: Secondary | ICD-10-CM

## 2020-11-06 DIAGNOSIS — Z823 Family history of stroke: Secondary | ICD-10-CM | POA: Diagnosis not present

## 2020-11-06 DIAGNOSIS — E785 Hyperlipidemia, unspecified: Secondary | ICD-10-CM | POA: Diagnosis present

## 2020-11-06 DIAGNOSIS — G9349 Other encephalopathy: Secondary | ICD-10-CM | POA: Diagnosis present

## 2020-11-06 DIAGNOSIS — Z7901 Long term (current) use of anticoagulants: Secondary | ICD-10-CM

## 2020-11-06 DIAGNOSIS — S065XAA Traumatic subdural hemorrhage with loss of consciousness status unknown, initial encounter: Secondary | ICD-10-CM | POA: Diagnosis present

## 2020-11-06 DIAGNOSIS — Z515 Encounter for palliative care: Principal | ICD-10-CM

## 2020-11-06 DIAGNOSIS — G8194 Hemiplegia, unspecified affecting left nondominant side: Secondary | ICD-10-CM | POA: Diagnosis present

## 2020-11-06 DIAGNOSIS — I2510 Atherosclerotic heart disease of native coronary artery without angina pectoris: Secondary | ICD-10-CM | POA: Diagnosis present

## 2020-11-06 DIAGNOSIS — I69354 Hemiplegia and hemiparesis following cerebral infarction affecting left non-dominant side: Secondary | ICD-10-CM

## 2020-11-06 DIAGNOSIS — Z79890 Hormone replacement therapy (postmenopausal): Secondary | ICD-10-CM

## 2020-11-06 DIAGNOSIS — I4891 Unspecified atrial fibrillation: Secondary | ICD-10-CM | POA: Diagnosis not present

## 2020-11-06 DIAGNOSIS — Z79899 Other long term (current) drug therapy: Secondary | ICD-10-CM

## 2020-11-06 DIAGNOSIS — E1159 Type 2 diabetes mellitus with other circulatory complications: Secondary | ICD-10-CM | POA: Diagnosis present

## 2020-11-06 LAB — COMPREHENSIVE METABOLIC PANEL
ALT: 13 U/L (ref 0–44)
AST: 18 U/L (ref 15–41)
Albumin: 3.7 g/dL (ref 3.5–5.0)
Alkaline Phosphatase: 77 U/L (ref 38–126)
Anion gap: 7 (ref 5–15)
BUN: 23 mg/dL (ref 8–23)
CO2: 27 mmol/L (ref 22–32)
Calcium: 9.1 mg/dL (ref 8.9–10.3)
Chloride: 104 mmol/L (ref 98–111)
Creatinine, Ser: 1.43 mg/dL — ABNORMAL HIGH (ref 0.61–1.24)
GFR, Estimated: 50 mL/min — ABNORMAL LOW (ref >60)
Glucose, Bld: 157 mg/dL — ABNORMAL HIGH (ref 70–99)
Potassium: 3.6 mmol/L (ref 3.5–5.1)
Sodium: 138 mmol/L (ref 135–145)
Total Bilirubin: 0.8 mg/dL (ref 0.3–1.2)
Total Protein: 8 g/dL (ref 6.5–8.1)

## 2020-11-06 LAB — RESP PANEL BY RT-PCR (FLU A&B, COVID) ARPGX2
Influenza A by PCR: NEGATIVE
Influenza B by PCR: NEGATIVE
SARS Coronavirus 2 by RT PCR: NEGATIVE

## 2020-11-06 LAB — CBC
HCT: 38.6 % — ABNORMAL LOW (ref 39.0–52.0)
Hemoglobin: 13.3 g/dL (ref 13.0–17.0)
MCH: 32.8 pg (ref 26.0–34.0)
MCHC: 34.5 g/dL (ref 30.0–36.0)
MCV: 95.1 fL (ref 80.0–100.0)
Platelets: 197 10*3/uL (ref 150–400)
RBC: 4.06 MIL/uL — ABNORMAL LOW (ref 4.22–5.81)
RDW: 15.2 % (ref 11.5–15.5)
WBC: 13 10*3/uL — ABNORMAL HIGH (ref 4.0–10.5)
nRBC: 0 % (ref 0.0–0.2)

## 2020-11-06 LAB — DIFFERENTIAL
Abs Immature Granulocytes: 0.04 10*3/uL (ref 0.00–0.07)
Basophils Absolute: 0 10*3/uL (ref 0.0–0.1)
Basophils Relative: 0 %
Eosinophils Absolute: 0.1 10*3/uL (ref 0.0–0.5)
Eosinophils Relative: 1 %
Immature Granulocytes: 0 %
Lymphocytes Relative: 51 %
Lymphs Abs: 6.7 10*3/uL — ABNORMAL HIGH (ref 0.7–4.0)
Monocytes Absolute: 1.3 10*3/uL — ABNORMAL HIGH (ref 0.1–1.0)
Monocytes Relative: 10 %
Neutro Abs: 5 10*3/uL (ref 1.7–7.7)
Neutrophils Relative %: 38 %
Smear Review: NORMAL

## 2020-11-06 LAB — PROTIME-INR
INR: 1.4 — ABNORMAL HIGH (ref 0.8–1.2)
INR: 1.1 (ref 0.8–1.2)
Prothrombin Time: 17.5 seconds — ABNORMAL HIGH (ref 11.4–15.2)
Prothrombin Time: 14.6 seconds (ref 11.4–15.2)

## 2020-11-06 LAB — SODIUM: Sodium: 140 mmol/L (ref 135–145)

## 2020-11-06 LAB — APTT: aPTT: 30 seconds (ref 24–36)

## 2020-11-06 LAB — CBG MONITORING, ED: Glucose-Capillary: 143 mg/dL — ABNORMAL HIGH (ref 70–99)

## 2020-11-06 MED ORDER — SODIUM CHLORIDE 0.9 % IV SOLN
2000.0000 mg | Freq: Once | INTRAVENOUS | Status: AC
  Administered 2020-11-06: 2000 mg via INTRAVENOUS
  Filled 2020-11-06: qty 20

## 2020-11-06 MED ORDER — PROTHROMBIN COMPLEX CONC HUMAN 500 UNITS IV KIT
1558.0000 [IU] | PACK | Status: AC
  Administered 2020-11-06: 1558 [IU] via INTRAVENOUS
  Filled 2020-11-06: qty 1558

## 2020-11-06 MED ORDER — VITAMIN K1 10 MG/ML IJ SOLN
10.0000 mg | INTRAVENOUS | Status: AC
  Administered 2020-11-06: 10 mg via INTRAVENOUS
  Filled 2020-11-06: qty 1

## 2020-11-06 MED ORDER — SCOPOLAMINE 1 MG/3DAYS TD PT72
1.0000 | MEDICATED_PATCH | TRANSDERMAL | Status: DC
  Administered 2020-11-06: 1.5 mg via TRANSDERMAL
  Filled 2020-11-06: qty 1

## 2020-11-06 MED ORDER — GLYCOPYRROLATE 1 MG PO TABS
1.0000 mg | ORAL_TABLET | ORAL | Status: DC | PRN
  Filled 2020-11-06: qty 1

## 2020-11-06 MED ORDER — MORPHINE BOLUS VIA INFUSION
5.0000 mg | INTRAVENOUS | Status: DC | PRN
  Filled 2020-11-06: qty 5

## 2020-11-06 MED ORDER — ACETAMINOPHEN 650 MG RE SUPP: 650.0000 mg | Freq: Four times a day (QID) | RECTAL | Status: DC | PRN

## 2020-11-06 MED ORDER — DEXTROSE 5 % IV SOLN: INTRAVENOUS | Status: DC

## 2020-11-06 MED ORDER — ONDANSETRON HCL 4 MG/2ML IJ SOLN: 4.0000 mg | Freq: Four times a day (QID) | INTRAMUSCULAR | Status: DC | PRN

## 2020-11-06 MED ORDER — ACETAMINOPHEN 325 MG PO TABS: 650.0000 mg | ORAL_TABLET | Freq: Four times a day (QID) | ORAL | Status: DC | PRN

## 2020-11-06 MED ORDER — GLYCOPYRROLATE 0.2 MG/ML IJ SOLN
0.2000 mg | INTRAMUSCULAR | Status: DC | PRN
  Filled 2020-11-06: qty 1

## 2020-11-06 MED ORDER — LORAZEPAM 2 MG/ML IJ SOLN: 1.0000 mg | INTRAMUSCULAR | Status: DC | PRN

## 2020-11-06 MED ORDER — DIPHENHYDRAMINE HCL 50 MG/ML IJ SOLN: 25.0000 mg | INTRAMUSCULAR | Status: DC | PRN

## 2020-11-06 MED ORDER — CLEVIDIPINE BUTYRATE 0.5 MG/ML IV EMUL
6.0000 mg/h | INTRAVENOUS | Status: DC
  Administered 2020-11-06: 1 mg/h via INTRAVENOUS
  Filled 2020-11-06: qty 50

## 2020-11-06 MED ORDER — CLEVIDIPINE BUTYRATE 0.5 MG/ML IV EMUL
0.0000 mg/h | INTRAVENOUS | Status: DC
  Filled 2020-11-06: qty 50

## 2020-11-06 MED ORDER — SODIUM CHLORIDE 3 % IV BOLUS
250.0000 mL | Freq: Once | INTRAVENOUS | Status: AC
  Administered 2020-11-06: 250 mL via INTRAVENOUS
  Filled 2020-11-06: qty 500

## 2020-11-06 MED ORDER — ONDANSETRON HCL 4 MG PO TABS: 4.0000 mg | ORAL_TABLET | Freq: Four times a day (QID) | ORAL | Status: DC | PRN

## 2020-11-06 MED ORDER — POLYVINYL ALCOHOL 1.4 % OP SOLN
1.0000 [drp] | Freq: Four times a day (QID) | OPHTHALMIC | Status: DC | PRN
  Filled 2020-11-06: qty 15

## 2020-11-06 MED ORDER — GLYCOPYRROLATE 0.2 MG/ML IJ SOLN
0.2000 mg | INTRAMUSCULAR | Status: DC | PRN
  Administered 2020-11-07: 0.2 mg via SUBCUTANEOUS
  Filled 2020-11-06 (×3): qty 1

## 2020-11-06 MED ORDER — MORPHINE 100MG IN NS 100ML (1MG/ML) PREMIX INFUSION
0.0000 mg/h | INTRAVENOUS | Status: DC
  Administered 2020-11-06: 5 mg/h via INTRAVENOUS
  Administered 2020-11-07 (×2): 10 mg/h via INTRAVENOUS
  Filled 2020-11-06 (×3): qty 100

## 2020-11-06 MED ORDER — SODIUM CHLORIDE 0.9% FLUSH
3.0000 mL | Freq: Once | INTRAVENOUS | Status: AC
  Administered 2020-11-06: 3 mL via INTRAVENOUS

## 2020-11-06 MED ORDER — MORPHINE SULFATE (PF) 2 MG/ML IV SOLN: 2.0000 mg | INTRAVENOUS | Status: DC | PRN

## 2020-11-06 NOTE — Telephone Encounter (Signed)
Patients daughter called and LVM on triage line stating that she is on the way to the hospital because the patient was unresponsive. She just wanted to make Dr. Para March aware and will call back and update when she has further information.

## 2020-11-06 NOTE — ED Notes (Signed)
Neurology with pt in CT

## 2020-11-06 NOTE — H&P (Signed)
History and Physical   Darrell Hayes ZOX:[id number] DOB: 1941/06/12 DOA: 2020-12-06  Referring MD/NP/PA: Dr. Lenard Lance  PCP: Joaquim Nam, MD   Outpatient Specialists: Dr. Jens Som, cardiology  Patient coming from: Home  Chief Complaint: Fall  HPI: Darrell Hayes is a 80 y.o. male with medical history significant of atrial fibrillation on Coumadin, history of CVA, hypertension, hyperlipidemia, seizure disorder, GERD, morbid obesity who apparently was doing fine until he sustained a fall this afternoon in the bathroom.  Patient appeared to have hit his head after the fall.  Brought in with altered mental status.  He was seen in the ER and evaluated.  Patient found to have significant subdural hematoma with evidence of early herniation.  He was also found to have increased intracranial pressure.  Patient evaluated by critical care as well as neurosurgery.  Case discussed with family at length.  ER physician and myself also discussed with family.  Ultimately family decided to pursue comfort measures.  At this point patient is being admitted to the hospital for comfort measures and end-of-life care..  ED Course: Temperature 98.5 blood pressure 158/88 pulse 112 respiratory 35 oxygen sat 96% on room air.  White count 13.0 hemoglobin 13 point platelets 197.  Creatinine is 1.43.  INR was 1.4.  COVID-19 screening has been negative.  Head CT of code stroke showed acute right cerebral convexity subdural hematoma measuring 2.3 cm in thickness.  There is mass-effect with about 3 mm of leftward midline shift and 6 mm herniation.  Multiple other abnormal findings.  Patient is completely obtunded and already herniating  Review of Systems: As per HPI otherwise 10 point review of systems negative.    Past Medical History:  Diagnosis Date  . Agitation    and irritability, situational exacerbation  . Alveolar/parietoalveolar pneumonopathy, other    06/14/09 CT chest, Mild LLL GGO; 02/28/10 CT chest ,  improved  . Atrial fibrillation (HCC)    Coumadin therapy - Dr. Jens Som.  Cardioversion 08/23/09, Amiodarone Rx since 09/03/2009  . CAD (coronary artery disease) 09/2008   Dr. Jens Som, stent to LAD  . CVA (cerebral vascular accident) (HCC) 2007   In the setting of atrial fibrillation  . Dyspnea 09/2008   Class 3, since 2008.  Significant tachycardia.  No PE or fibrosis on CT chest 3/10, 12/10, 02/2010, normal PFT but isolated low DLCO  06/2009, normal hgb, TSH, creat and albumin , Dec 2010.  Resolved after A Fib cardioversion and Amiodarone Marh 2011  . Hepatitis   . Hiatal hernia   . History of chickenpox   . Hyperlipidemia   . Hypertension   . Nonspecific abnormal results of pulmonary system function study   . Pulmonary nodules 09/2008   Nonspecific reticulonodular nodules RLL superior segment, resolved on CT 06/14/09  . Seizures (HCC) 12/2008   Dr. Sharene Skeans    Past Surgical History:  Procedure Laterality Date  . APPENDECTOMY    . IR PERC CHOLECYSTOSTOMY  12/23/2018  . Stent placed  09/2008     reports that he quit smoking about 13 years ago. His smoking use included cigarettes and pipe. He quit after 32.00 years of use. He has never used smokeless tobacco. He reports that he does not drink alcohol and does not use drugs.  Allergies  Allergen Reactions  . Tamiflu [Oseltamivir]     Family History  Problem Relation Age of Onset  . Hyperlipidemia Mother   . Hypertension Mother   . Arthritis Mother  osteoarthritis  . Stroke Father   . Heart disease Other        Family history of heart disease     Prior to Admission medications   Medication Sig Start Date End Date Taking? Authorizing Provider  albuterol (PROVENTIL) (2.5 MG/3ML) 0.083% nebulizer solution Take 2.5 mg by nebulization every 4 (four) hours as needed for wheezing or shortness of breath.    [provider]  amiodarone (PACERONE) 200 MG tablet Take 1 tablet (200 mg total) by mouth daily. 09/14/19    Joaquim Namuncan, Graham S, MD  cholecalciferol (VITAMIN D) 1000 UNITS tablet Take 1,000 Units by mouth daily.    [provider]  docusate sodium (COLACE) 100 MG capsule Take 100 mg by mouth daily.    [provider]  Ferrous Sulfate (IRON) 325 (65 Fe) MG TABS Take one tablet three times a week by mouth 08/22/20   Joaquim Namuncan, Graham S, MD  levothyroxine (SYNTHROID) 50 MCG tablet Take 50 mcg by mouth every evening.    [provider]  metoprolol succinate (TOPROL-XL) 25 MG 24 hr tablet Take 0.5 tablets (12.5 mg total) by mouth daily. Take with or immediately following a meal. 09/14/19   Joaquim Namuncan, Graham S, MD  Nutritional Supplements (ENSURE HIGH PROTEIN) LIQD Take 237 ml by mouth twice a day  as protein supplement 08/09/20   [provider]  nystatin (MYCOSTATIN/NYSTOP) powder Apply 1 application topically daily as needed. 09/14/19   Joaquim Namuncan, Graham S, MD  rosuvastatin (CRESTOR) 20 MG tablet Take 1 tablet (20 mg total) by mouth daily. 04/14/11   Joaquim Namuncan, Graham S, MD  tamsulosin (FLOMAX) 0.4 MG CAPS capsule Take 0.8 mg by mouth at bedtime.    [provider]  warfarin (COUMADIN) 2 MG tablet Take 2mg  by mouth Tues, Thurs, and Sat or as directed by coumadin clinic. 10/10/19   Joaquim Namuncan, Graham S, MD  warfarin (COUMADIN) 3 MG tablet Take 3 mg by mouth Mon, Wed, Fri and Sun or as directed by coumadin clinic. 10/10/19   Joaquim Namuncan, Graham S, MD    Physical Exam: Vitals:   11/05/2020 1830 11/23/2020 1900 11/08/2020 1930 11/15/2020 2007  BP: 138/78 125/76    Pulse: (!) 112 (!) 110 (!) 109   Resp:    (!) 24  Temp:      TempSrc:      SpO2: 99% 99% 99%   Weight:          Constitutional: Obtunded, unresponsive Vitals:   11/04/2020 1830 11/13/2020 1900 11/21/2020 1930 11/19/2020 2007  BP: 138/78 125/76    Pulse: (!) 112 (!) 110 (!) 109   Resp:    (!) 24  Temp:      TempSrc:      SpO2: 99% 99% 99%   Weight:       Eyes: PERRL, lids and conjunctivae normal ENMT: Mucous membranes are moist.  Posterior pharynx clear of any exudate or lesions.Normal dentition.  Neck: normal, supple, no masses, no thyromegaly Respiratory: Hyperventilating, clear to auscultation bilaterally, no wheezing, no crackles. Normal respiratory effort. No accessory muscle use.  Cardiovascular: Sinus tachycardia no murmurs / rubs / gallops. No extremity edema. 2+ pedal pulses. No carotid bruits.  Abdomen: no tenderness, no masses palpated. No hepatosplenomegaly. Bowel sounds positive.  Musculoskeletal: no clubbing / cyanosis. No joint deformity upper and lower extremities. Good ROM, no contractures. Normal muscle tone.  Skin: no rashes, lesions, ulcers. No induration Neurologic: Poor reflexes and no focal response to touch or voice Psychiatric: Completely obtunded.  Labs on Admission: I have personally reviewed following labs and imaging studies  CBC: Recent Labs  Lab Nov 17, 2020 1316  WBC 13.0*  NEUTROABS 5.0  HGB 13.3  HCT 38.6*  MCV 95.1  PLT 197   Basic Metabolic Panel: Recent Labs  Lab 11/17/20 1316 11-17-20 1814  NA 138 140  K 3.6  --   CL 104  --   CO2 27  --   GLUCOSE 157*  --   BUN 23  --   CREATININE 1.43*  --   CALCIUM 9.1  --    GFR: Estimated Creatinine Clearance: 45.6 mL/min (A) (by C-G formula based on SCr of 1.43 mg/dL (H)). Liver Function Tests: Recent Labs  Lab 11/17/2020 1316  AST 18  ALT 13  ALKPHOS 77  BILITOT 0.8  PROT 8.0  ALBUMIN 3.7   No results for input(s): LIPASE, AMYLASE in the last 168 hours. No results for input(s): AMMONIA in the last 168 hours. Coagulation Profile: Recent Labs  Lab 11-17-20 1316 11-17-2020 1517  INR 1.4* 1.1   Cardiac Enzymes: No results for input(s): CKTOTAL, CKMB, CKMBINDEX, TROPONINI in the last 168 hours. BNP (last 3 results) No results for input(s): PROBNP in the last 8760 hours. HbA1C: No results for input(s): HGBA1C in the last 72 hours. CBG: Recent Labs  Lab 11/17/2020 1314  GLUCAP 143*   Lipid Profile: No  results for input(s): CHOL, HDL, LDLCALC, TRIG, CHOLHDL, LDLDIRECT in the last 72 hours. Thyroid Function Tests: No results for input(s): TSH, T4TOTAL, FREET4, T3FREE, THYROIDAB in the last 72 hours. Anemia Panel: No results for input(s): VITAMINB12, FOLATE, FERRITIN, TIBC, IRON, RETICCTPCT in the last 72 hours. Urine analysis:    Component Value Date/Time   COLORURINE YELLOW 08/15/2019 1527   APPEARANCEUR CLEAR 08/15/2019 1527   APPEARANCEUR Clear 09/22/2013 2218   LABSPEC 1.015 08/15/2019 1527   LABSPEC 1.018 09/22/2013 2218   PHURINE 7.0 08/15/2019 1527   GLUCOSEU NEGATIVE 08/15/2019 1527   GLUCOSEU Negative 09/22/2013 2218   HGBUR MODERATE (A) 08/15/2019 1527   BILIRUBINUR NEGATIVE 08/15/2019 1527   BILIRUBINUR Negative 09/22/2013 2218   KETONESUR NEGATIVE 08/15/2019 1527   PROTEINUR NEGATIVE 08/15/2019 1527   UROBILINOGEN 0.2 12/23/2010 1522   NITRITE NEGATIVE 08/15/2019 1527   LEUKOCYTESUR MODERATE (A) 08/15/2019 1527   LEUKOCYTESUR 1+ 09/22/2013 2218   Sepsis Labs: @LABRCNTIP (procalcitonin:4,lacticidven:4) ) Recent Results (from the past 240 hour(s))  Resp Panel by RT-PCR (Flu A&B, Covid) Nasopharyngeal Swab     Status: None   Collection Time: 11-17-20  1:10 PM   Specimen: Nasopharyngeal Swab; Nasopharyngeal(NP) swabs in vial transport medium  Result Value Ref Range Status   SARS Coronavirus 2 by RT PCR NEGATIVE NEGATIVE Final    Comment: (NOTE) SARS-CoV-2 target nucleic acids are NOT DETECTED.  The SARS-CoV-2 RNA is generally detectable in upper respiratory specimens during the acute phase of infection. The lowest concentration of SARS-CoV-2 viral copies this assay can detect is 138 copies/mL. A negative result does not preclude SARS-Cov-2 infection and should not be used as the sole basis for treatment or other patient management decisions. A negative result may occur with  improper specimen collection/handling, submission of specimen other than nasopharyngeal  swab, presence of viral mutation(s) within the areas targeted by this assay, and inadequate number of viral copies(<138 copies/mL). A negative result must be combined with clinical observations, patient history, and epidemiological information. The expected result is Negative.  Fact Sheet for Patients:  01/06/21  Fact Sheet for Healthcare Providers:  BloggerCourse.com  This test is no t yet approved or cleared by the [place] and  has been authorized for detection and/or diagnosis of SARS-CoV-2 by FDA under an Emergency Use Authorization (EUA). This EUA will remain  in effect (meaning this test can be used) for the duration of the COVID-19 declaration under Section 564(b)(1) of the Act, 21 U.S.C.section 360bbb-3(b)(1), unless the authorization is terminated  or revoked sooner.       Influenza A by PCR NEGATIVE NEGATIVE Final   Influenza B by PCR NEGATIVE NEGATIVE Final    Comment: (NOTE) The Xpert Xpress SARS-CoV-2/FLU/RSV plus assay is intended as an aid in the diagnosis of influenza from Nasopharyngeal swab specimens and should not be used as a sole basis for treatment. Nasal washings and aspirates are unacceptable for Xpert Xpress SARS-CoV-2/FLU/RSV testing.  Fact Sheet for Patients: BloggerCourse.com  Fact Sheet for Healthcare Providers: SeriousBroker.it  This test is not yet approved or cleared by the [place] FDA and has been authorized for detection and/or diagnosis of SARS-CoV-2 by FDA under an Emergency Use Authorization (EUA). This EUA will remain in effect (meaning this test can be used) for the duration of the COVID-19 declaration under Section 564(b)(1) of the Act, 21 U.S.C. section 360bbb-3(b)(1), unless the authorization is terminated or revoked.  Performed at [facility], [address], [place], [place] [address]       Radiological Exams on Admission: CT HEAD CODE STROKE WO CONTRAST  Result Date: 11/26/20 CLINICAL DATA:  Code stroke.  Neuro deficit suspected. EXAM: CT HEAD WITHOUT CONTRAST TECHNIQUE: Contiguous axial images were obtained from the base of the skull through the vertex without intravenous contrast. COMPARISON:  None. FINDINGS: Brain: Acute right cerebral convexity subdural hematoma, measuring up to 2.3 cm in thickness. Resulting mass effect with approximately 3 mm of leftward midline shift at the foramina of [place] and 6 mm of leftward subfalcine herniation. Mass effect on the right lateral ventricle, which is effaced. Basal cisterns are patent. No evidence of hydrocephalus. Similar large area of encephalomalacia involving the left temporal, parietooccipital lobes, posterior left MCA territory. No evidence of acute large vascular territory infarct. Vascular: Hyperdense appearance of the right MCA. Skull: No acute fracture. Sinuses/Orbits: Mild mucosal thickening. Other: No mastoid effusions. IMPRESSION: 1. Acute right cerebral convexity subdural hematoma, measuring up to 2.3 cm in thickness. Resulting mass effect with approximately 3 mm of leftward midline shift at the foramen of [place] and approximately 6 mm of leftward subfalcine herniation. Heterogeneous/swirling appearance of the subdural hemorrhage is concerning for hyperacute hemorrhage. 2. Hyperdense appearance of the right MCA, which could represent thrombus or artifact. CTA could further evaluate. 3. Large remote left MCA territory infarct. Code stroke imaging results were communicated on 11/26/20 at 1:44 pm to provider Dr. Larinda Buttery via telephone, who verbally acknowledged these results. Electronically Signed   By: Feliberto Harts MD   On: 11-26-20 13:49      Assessment/Plan Principal Problem:   Subdural hematoma (HCC) Active Problems:   Coronary atherosclerosis   ATRIAL FIBRILLATION   Type 2 diabetes mellitus with vascular disease  (HCC)     #1 large subdural hematoma: Comfort measures only at this point.  Patient will be admitted and initiated on morphine drip.  Scopolamine patch, other comfort measures.  We will DC other active medications including cardiac monitoring.  #2 diabetes: Currently on comfort measures so no active treatment.  #3 atrial fibrillation: Patient has received vitamin K in the ER to reverse INR.  No active  treatment at this point.  #4 comfort measures: Full comfort measures instituted.  Patient is already obtunded.  On morphine drip to be titrated.  Ativan added.  Scopolamine patch.  Robinul.   DVT prophylaxis: None Code Status: DNR Family Communication: Daughter is at bedside Disposition Plan: Comfort Consults called: Dr. Marcell Barlow, neurosurgery Admission status: Inpatient  Severity of Illness: The appropriate patient status for this patient is INPATIENT. Inpatient status is judged to be reasonable and necessary in order to provide the required intensity of service to ensure the patient's safety. The patient's presenting symptoms, physical exam findings, and initial radiographic and laboratory data in the context of their chronic comorbidities is felt to place them at high risk for further clinical deterioration. Furthermore, it is not anticipated that the patient will be medically stable for discharge from the hospital within 2 midnights of admission. The following factors support the patient status of inpatient.   " The patient's presenting symptoms include fall with altered mental status. " The worrisome physical exam findings include complete obtundation. " The initial radiographic and laboratory data are worrisome because of blood subdural hematoma. " The chronic co-morbidities include atrial fibrillation.   * I certify that at the point of admission it is my clinical judgment that the patient will require inpatient hospital care spanning beyond 2 midnights from the point of admission  due to high intensity of service, high risk for further deterioration and high frequency of surveillance required.Lonia Blood MD [facility] Pager [phone]  If 7PM-7AM, please contact night-coverage [email or web address] Password [facility]  2020/11/20, 8:34 PM

## 2020-11-06 NOTE — ED Notes (Signed)
Patient is now on comfort cares.  Morphine drip has been started.  This RN was advised that the Cleviprex drip was to continue through the night at 6mg /hr, 44ml/hr.    Monitor has been placed in Comfort Cares mode for patient and family comfort.

## 2020-11-06 NOTE — ED Notes (Signed)
MD speaking with family

## 2020-11-06 NOTE — ED Provider Notes (Signed)
[facility] Emergency Department Provider Note   ____________________________________________   Event Date/Time   First MD Initiated Contact with Patient 11/22/2020 1330     (approximate)  I have reviewed the triage vital signs and the nursing notes.   HISTORY  Chief Complaint unresponsive    HPI Darrell Hayes is a 80 y.o. male with past medical history of hypertension, hyperlipidemia, atrial fibrillation on Coumadin, CAD, stroke, and seizure who presents to the ED for altered mental status.  History is limited as patient arrives unresponsive.  Per EMS, he was last seen normal around noon by his home health aide, subsequently found about 20 minutes prior to arrival unresponsive with sonorous respirations.  Speaking with family, patient has a history of stroke with right-sided hemiparesis, is able to ambulate with a walker at baseline and able to communicate, but with slurred speech.  He arrives with DNR form and family state that he has requested to avoid aggressive interventions.  Prior to being found this afternoon, he had been doing well and had not complained of anything.        Past Medical History:  Diagnosis Date  . Agitation    and irritability, situational exacerbation  . Alveolar/parietoalveolar pneumonopathy, other    06/14/09 CT chest, Mild LLL GGO; 02/28/10 CT chest , improved  . Atrial fibrillation (HCC)    Coumadin therapy - Dr. Jens Som.  Cardioversion 08/23/09, Amiodarone Rx since 09/03/2009  . CAD (coronary artery disease) 09/2008   Dr. Jens Som, stent to LAD  . CVA (cerebral vascular accident) (HCC) 2007   In the setting of atrial fibrillation  . Dyspnea 09/2008   Class 3, since 2008.  Significant tachycardia.  No PE or fibrosis on CT chest 3/10, 12/10, 02/2010, normal PFT but isolated low DLCO  06/2009, normal hgb, TSH, creat and albumin , Dec 2010.  Resolved after A Fib cardioversion and Amiodarone Marh 2011  . Hepatitis   . Hiatal hernia    . History of chickenpox   . Hyperlipidemia   . Hypertension   . Nonspecific abnormal results of pulmonary system function study   . Pulmonary nodules 09/2008   Nonspecific reticulonodular nodules RLL superior segment, resolved on CT 06/14/09  . Seizures (HCC) 12/2008   Dr. Sharene Skeans    Patient Active Problem List   Diagnosis Date Noted  . Cough 08/25/2020  . Skin lesion 06/26/2020  . Skin irritation 09/17/2019  . Long term (current) use of anticoagulants 07/13/2019  . 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 RUQ abdominal pain   . Acute cholecystitis   . Altered mental status 12/23/2010  . BACK PAIN 09/15/2010  . PRESSURE ULCER BUTTOCK 07/31/2010  . CEREBROVASCULAR DISEASE 06/12/2010  . DEPRESSION 04/08/2010  . HEPATITIS 04/08/2010  . SEIZURE DISORDER 04/08/2010  . Personal history of unspecified circulatory disease 04/08/2010  . Recurrent UTI 04/08/2010  . OTHER AND UNSPECIFIED HYPERLIPIDEMIA 06/14/2009  . UNSPEC ALVEOLAR&PARIETOALVEOLAR PNEUMONOPATHY 06/14/2009  . ABNORMAL PULMONARY TEST RESULTS 06/14/2009  . PULMONARY NODULE 05/28/2009  . HYPOPOTASSEMIA 05/08/2009  . CAD 04/30/2009  . COMPUTERIZED TOMOGRAPHY, CHEST, ABNORMAL 04/30/2009  . CHEST PAIN 09/25/2008  . HYPERCHOLESTEROLEMIA, PURE 09/11/2008  . Shortness of breath 09/11/2008  . Essential hypertension 09/10/2008  . ATRIAL FIBRILLATION 09/10/2008  . Stroke [facility]) 09/10/2008    Past Surgical History:  Procedure Laterality Date  . APPENDECTOMY    . IR PERC CHOLECYSTOSTOMY  12/23/2018  . Stent placed  09/2008    Prior to  Admission medications   Medication Sig Start Date End Date Taking? Authorizing Provider  albuterol (PROVENTIL) (2.5 MG/3ML) 0.083% nebulizer solution Take 2.5 mg by nebulization every 4 (four) hours as needed for wheezing or shortness of breath.    [provider]  amiodarone (PACERONE) 200 MG tablet Take 1 tablet (200 mg total) by mouth daily. 09/14/19    Joaquim Namuncan, Graham S, MD  cholecalciferol (VITAMIN D) 1000 UNITS tablet Take 1,000 Units by mouth daily.    [provider]  docusate sodium (COLACE) 100 MG capsule Take 100 mg by mouth daily.    [provider]  Ferrous Sulfate (IRON) 325 (65 Fe) MG TABS Take one tablet three times a week by mouth 08/22/20   Joaquim Namuncan, Graham S, MD  levothyroxine (SYNTHROID) 50 MCG tablet Take 50 mcg by mouth every evening.    [provider]  metoprolol succinate (TOPROL-XL) 25 MG 24 hr tablet Take 0.5 tablets (12.5 mg total) by mouth daily. Take with or immediately following a meal. 09/14/19   Joaquim Namuncan, Graham S, MD  Nutritional Supplements (ENSURE HIGH PROTEIN) LIQD Take 237 ml by mouth twice a day  as protein supplement 08/09/20   [provider]  nystatin (MYCOSTATIN/NYSTOP) powder Apply 1 application topically daily as needed. 09/14/19   Joaquim Namuncan, Graham S, MD  rosuvastatin (CRESTOR) 20 MG tablet Take 1 tablet (20 mg total) by mouth daily. 04/14/11   Joaquim Namuncan, Graham S, MD  tamsulosin (FLOMAX) 0.4 MG CAPS capsule Take 0.8 mg by mouth at bedtime.    [provider]  warfarin (COUMADIN) 2 MG tablet Take 2mg  by mouth Tues, Thurs, and Sat or as directed by coumadin clinic. 10/10/19   Joaquim Namuncan, Graham S, MD  warfarin (COUMADIN) 3 MG tablet Take 3 mg by mouth Mon, Wed, Fri and Sun or as directed by coumadin clinic. 10/10/19   Joaquim Namuncan, Graham S, MD    Allergies Tamiflu [oseltamivir]  Family History  Problem Relation Age of Onset  . Hyperlipidemia Mother   . Hypertension Mother   . Arthritis Mother        osteoarthritis  . Stroke Father   . Heart disease Other        Family history of heart disease    Social History Social History   Tobacco Use  . Smoking status: Former Smoker    Years: 32.00    Types: Cigarettes, Pipe    Quit date: 11/27/2006    Years since quitting: 13.9  . Smokeless tobacco: Never Used  Vaping Use  . Vaping Use: Never used  Substance Use Topics  . Alcohol  use: No  . Drug use: Never    Review of Systems Unable to obtain secondary to altered mental status  ____________________________________________   PHYSICAL EXAM:  VITAL SIGNS: ED Triage Vitals [11/19/2020 1315]  Enc Vitals Group     BP      Pulse Rate 90     Resp (!) 30     Temp      Temp src      SpO2 99 %     Weight      Height      Head Circumference      Peak Flow      Pain Score      Pain Loc      Pain Edu?      Excl. in GC?     Constitutional: Unresponsive with sonorous respirations. Eyes: Conjunctivae are normal.  Pupils equal round and reactive to light bilaterally,  gaze deviation to the right. Head: Atraumatic. Nose: No congestion/rhinnorhea. Mouth/Throat: Mucous membranes are moist. Neck: Normal ROM Cardiovascular: Normal rate, regular rhythm. Grossly normal heart sounds. Respiratory: Sonorous respirations.  No retractions. Lungs CTAB. Gastrointestinal: Soft and nontender. No distention. Genitourinary: deferred Musculoskeletal: No lower extremity tenderness nor edema. Neurologic: Small amount of spontaneous movement in right upper and lower extremities, no movement noted in left upper and lower extremities.  Slight withdrawal from pain in right upper and lower extremities. Skin:  Skin is warm, dry and intact. No rash noted. Psychiatric: Unable to assess.  ____________________________________________   LABS (all labs ordered are listed, but only abnormal results are displayed)  Labs Reviewed  PROTIME-INR - Abnormal; Notable for the following components:      Result Value   Prothrombin Time 17.5 (*)    INR 1.4 (*)    All other components within normal limits  CBC - Abnormal; Notable for the following components:   WBC 13.0 (*)    RBC 4.06 (*)    HCT 38.6 (*)    All other components within normal limits  DIFFERENTIAL - Abnormal; Notable for the following components:   Lymphs Abs 6.7 (*)    Monocytes Absolute 1.3 (*)    All other components within  normal limits  COMPREHENSIVE METABOLIC PANEL - Abnormal; Notable for the following components:   Glucose, Bld 157 (*)    Creatinine, Ser 1.43 (*)    GFR, Estimated 50 (*)    All other components within normal limits  CBG MONITORING, ED - Abnormal; Notable for the following components:   Glucose-Capillary 143 (*)    All other components within normal limits  RESP PANEL BY RT-PCR (FLU A&B, COVID) ARPGX2  APTT  PROTIME-INR  CBG MONITORING, ED  I-STAT CREATININE, ED   ____________________________________________  EKG  ED ECG REPORT I, Chesley Noon, the attending physician, personally viewed and interpreted this ECG.   Date: 11/22/2020  EKG Time: 13:38  Rate: 88  Rhythm: atrial fibrillation, frequent PVCs  Axis: Normal  Intervals:none  ST&T Change: None   PROCEDURES  Procedure(s) performed (including Critical Care):  .Critical Care Performed by: Chesley Noon, MD Authorized by: Chesley Noon, MD   Critical care provider statement:    Critical care time (minutes):  75   Critical care time was exclusive of:  Separately billable procedures and treating other patients and teaching time   Critical care was necessary to treat or prevent imminent or life-threatening deterioration of the following conditions:  CNS failure or compromise   Critical care was time spent personally by me on the following activities:  Discussions with consultants, evaluation of patient's response to treatment, examination of patient, ordering and performing treatments and interventions, ordering and review of laboratory studies, ordering and review of radiographic studies, pulse oximetry, re-evaluation of patient's condition, obtaining history from patient or surrogate and review of old charts   I assumed direction of critical care for this patient from another provider in my specialty: no       ____________________________________________   INITIAL IMPRESSION / ASSESSMENT AND PLAN / ED  COURSE       80 year old male with past medical history of hypertension, hyperlipidemia, atrial fibrillation on Coumadin, CAD, CVA, and seizure who presents to the ED for acute onset altered mental status after being found down by his caregiver.  Patient arrives with gaze deviation to the right and no movement or pain response on the left, does reportedly have baseline deficits on the right due to  prior stroke.  Given acute onset, code stroke was called and patient brought to CT scanner, which revealed significant right-sided subdural hemorrhage with associated mass-effect and midline shift.  Patient's Coumadin was reversed with Kcentra and vitamin K, he was also given hypertonic saline.  Family at bedside confirms that patient would not want aggressive measures and has pacifically stated he does not want to be on a ventilator or be given CPR.  Clevidipine ordered by neurology for blood pressure control, patient also to be given loading dose of Keppra.  Case discussed with Dr. Marcell Barlow of neurosurgery, who will evaluate the patient but prognosis is poor at this time.  I spoke with patient's provider at [facility] who states that on multiple occasions patient has stated he does not want aggressive care or interventions.  They were able to fax over MOST form stating patient would want to avoid ICU level of care or similar aggressive interventions.  After further discussion with family, transitioning to comfort care seems to most be in line with patient's wishes.  We will have neurosurgery evaluate but anticipate admission for comfort care at this time.  Patient turned over to oncoming provider pending neurosurgical evaluation and admission.      ____________________________________________   FINAL CLINICAL IMPRESSION(S) / ED DIAGNOSES  Final diagnoses:  Subdural hemorrhage [facility])     ED Discharge Orders    None       Note:  This document was prepared using Dragon voice recognition software and  may include unintentional dictation errors.   Chesley Noon, MD 11/13/20 [id number]

## 2020-11-06 NOTE — ED Notes (Signed)
Per Lab wait closer to 4pm to send protime-INR

## 2020-11-06 NOTE — ED Notes (Signed)
CODE STROKE called to [facility] spoke with Doug 

## 2020-11-06 NOTE — Consult Note (Signed)
Neurology Consultation Reason for Consult: Code stroke Requesting Physician: Chesley Noon  CC: Altered mental status and left sided weakness with right gaze deviation   History is obtained from: EDP and chart review   HPI: CAGNEY DEGRACE is a 80 y.o. male with a past medical history significant for atrial fibrillation on warfarin (recent subtherapeutic INRs), coronary artery disease, prior CVA (2007, left MCA territory mostly inferior, mild residual right-sided weakness), hyperlipidemia, hypertension, seizures (followed by Dr. Sharene Skeans).  He was reportedly at his baseline at noon and then found unresponsive at 1 PM with gaze deviation and left-sided weakness for which code stroke was activated  At his last anticoagulation visit, INR was subtherapeutic at 1.4 and his dose was increased "today to 4 mg and then change your weekly dose to take 2 mg (1 tablet everyday EXCEPT take 4 mg (2 tablets) on Sundays. Re-check in 4 weeks per patient request." He was also subtherapeutic at 1.4 on 3/29 and 3/15 at 1.5   LKW: No tPA given?: No, on Coumadin and head bleed confirmed Premorbid modified rankin scale: 2-3     2 - Slight disability. Able to look after own affairs without assistance, but unable to carry out all previous activities.     3 - Moderate disability. Requires some help, but able to walk unassisted.  ROS: Unable to obtain due to altered mental status.   Past Medical History:  Diagnosis Date  . Agitation    and irritability, situational exacerbation  . Alveolar/parietoalveolar pneumonopathy, other    06/14/09 CT chest, Mild LLL GGO; 02/28/10 CT chest , improved  . Atrial fibrillation (HCC)    Coumadin therapy - Dr. Jens Som.  Cardioversion 08/23/09, Amiodarone Rx since 09/03/2009  . CAD (coronary artery disease) 09/2008   Dr. Jens Som, stent to LAD  . CVA (cerebral vascular accident) (HCC) 2007   In the setting of atrial fibrillation  . Dyspnea 09/2008   Class 3, since 2008.   Significant tachycardia.  No PE or fibrosis on CT chest 3/10, 12/10, 02/2010, normal PFT but isolated low DLCO  06/2009, normal hgb, TSH, creat and albumin , Dec 2010.  Resolved after A Fib cardioversion and Amiodarone Marh 2011  . Hepatitis   . Hiatal hernia   . History of chickenpox   . Hyperlipidemia   . Hypertension   . Nonspecific abnormal results of pulmonary system function study   . Pulmonary nodules 09/2008   Nonspecific reticulonodular nodules RLL superior segment, resolved on CT 06/14/09  . Seizures (HCC) 12/2008   Dr. Sharene Skeans   Past Surgical History:  Procedure Laterality Date  . APPENDECTOMY    . IR PERC CHOLECYSTOSTOMY  12/23/2018  . Stent placed  09/2008    Current Facility-Administered Medications:  .  levETIRAcetam (KEPPRA) 2,000 mg in sodium chloride 0.9 % 250 mL IVPB, 2,000 mg, Intravenous, Once, Jazzalynn Rhudy L, MD .  phytonadione (VITAMIN K) 10 mg in dextrose 5 % 50 mL IVPB, 10 mg, Intravenous, STAT, Jessup, Charles, MD .  prothrombin complex conc human (KCENTRA) IVPB 1,500 Units, 1,500 Units, Intravenous, STAT, Jessup, Charles, MD .  sodium chloride 3% (hypertonic) IV bolus 250 mL, 250 mL, Intravenous, Once, Chesley Noon, MD .  sodium chloride flush (NS) 0.9 % injection 3 mL, 3 mL, Intravenous, Once, Chesley Noon, MD  Current Outpatient Medications:  .  albuterol (PROVENTIL) (2.5 MG/3ML) 0.083% nebulizer solution, Take 2.5 mg by nebulization every 4 (four) hours as needed for wheezing or shortness of breath., Disp: , Rfl:  .  amiodarone (PACERONE) 200 MG tablet, Take 1 tablet (200 mg total) by mouth daily., Disp: 30 tablet, Rfl: 0 .  cholecalciferol (VITAMIN D) 1000 UNITS tablet, Take 1,000 Units by mouth daily., Disp: , Rfl:  .  docusate sodium (COLACE) 100 MG capsule, Take 100 mg by mouth daily., Disp: , Rfl:  .  Ferrous Sulfate (IRON) 325 (65 Fe) MG TABS, Take one tablet three times a week by mouth, Disp: 30 tablet, Rfl: 0 .  levothyroxine (SYNTHROID) 50  MCG tablet, Take 50 mcg by mouth every evening., Disp: , Rfl:  .  metoprolol succinate (TOPROL-XL) 25 MG 24 hr tablet, Take 0.5 tablets (12.5 mg total) by mouth daily. Take with or immediately following a meal., Disp:  , Rfl:  .  Nutritional Supplements (ENSURE HIGH PROTEIN) LIQD, Take 237 ml by mouth twice a day  as protein supplement, Disp: , Rfl:  .  nystatin (MYCOSTATIN/NYSTOP) powder, Apply 1 application topically daily as needed., Disp: , Rfl:  .  rosuvastatin (CRESTOR) 20 MG tablet, Take 1 tablet (20 mg total) by mouth daily., Disp: 90 tablet, Rfl: 3 .  tamsulosin (FLOMAX) 0.4 MG CAPS capsule, Take 0.8 mg by mouth at bedtime., Disp: , Rfl:  .  warfarin (COUMADIN) 2 MG tablet, Take 2mg  by mouth Tues, Thurs, and Sat or as directed by coumadin clinic., Disp: 30 tablet, Rfl: 1 .  warfarin (COUMADIN) 3 MG tablet, Take 3 mg by mouth Mon, Wed, Fri and Sun or as directed by coumadin clinic., Disp: 30 tablet, Rfl: 1   Family History  Problem Relation Age of Onset  . Hyperlipidemia Mother   . Hypertension Mother   . Arthritis Mother        osteoarthritis  . Stroke Father   . Heart disease Other        Family history of heart disease    Social History:  reports that he quit smoking about 13 years ago. His smoking use included cigarettes and pipe. He quit after 32.00 years of use. He has never used smokeless tobacco. He reports that he does not drink alcohol and does not use drugs.   Exam: Current vital signs: BP (!) 157/87   Pulse 90   Temp 98.5 F (36.9 C) (Axillary)   Resp (!) 30   SpO2 99%  Vital signs in last 24 hours: Temp:  [98.5 F (36.9 C)] 98.5 F (36.9 C) (05/04 1334) Pulse Rate:  [90] 90 (05/04 1315) Resp:  [30] 30 (05/04 1315) BP: (157)/(87) 157/87 (05/04 1334) SpO2:  [99 %] 99 % (05/04 1315)   Physical Exam  Constitutional: Appears acutely on chronically ill Psych: Nonresponsive Eyes: No scleral injection HENT: No oropharyngeal obstruction.  No clear scalp  contusion MSK: no joint deformities.  Cardiovascular: Perfusing extremities well Respiratory: Sonorous and labored breathing GI: Obese but soft Skin: Warm dry and intact visible skin  Neuro: Mental Status: Eyes open, not following any commands, no verbal output Cranial Nerves: II: Pupils are equal, round 2 mm, no blink to threat  III,IV, VI: Right gaze preference, no change with VOR V/VII: Equal blink to eyelash brush VII: Facial movement with left droop at rest.  VIII: No response to voice  Motor: Spontaneous movement of the right upper and lower extremity, 2/5.  Trace withdrawal (movement does appear complex) in the left side with maximal noxious stimulation Sensory: More briskly responsive to noxious stimulation on the right than the left   NIHSS total 31 Score breakdown: 2 points for stupor, 2  points for not answering questions, 2 points for not following commands, 2 points for gaze forced deviation to the right, 3 points for no blink to threat throughout, 2 points for left facial droop, 3 points for left arm weakness, 2 points for right arm weakness, 3 points for left leg weakness, 2 points for right leg weakness, one-point for sensory loss on the left, 3 points for being mute, 2 points for dysarthria, 2 points for severe neglect of the left    I have reviewed labs in epic and the results pertinent to this consultation are: INR 1.4, PT 17.5 Leukocytosis to 13, platelets 197, Creatinine 1.43 (at his baseline) CMP otherwise unremarkable Glucose 157  I have personally reviewed the images obtained: Head CT with large hemispheric right subdural hemorrhage and midline shift.  Potential right MCA hyperdense sign versus artifact due to hemorrhage  Impression: This is an 80 year old man with past medical history as above presenting with a large subdural hemorrhage.  Briefly discussed with family at bedside the critical nature of this bleeding, and that the ED team and neurosurgery  would be providing further guidance.  Legrand Rams that the right MCA hyperdensity is artifactual, as the patient symptoms are adequately explained by the subdural hemorrhage.  Additionally with this large subdural hemorrhage and midline shift patient is not a candidate for interventional thrombectomy at this time  Recommendations: Defer to neurosurgery, pending their evaluation: -Systolic blood pressure control to less than 140  -Reversal of warfarin with vitamin K and Kcentra (advised prior to INR resulting) -Hypertonic saline -Keppra 2 g -Appreciate ongoing goals of care discussion with family per ED/neurosurgery  Brooke Dare MD-PhD [facility] [phone] [facility] coverage for [facility] is from 8 AM to 4 AM in-house and 4 PM to 8 PM by telephone/video. 8 PM to 8 AM emergent questions or overnight urgent questions should be addressed to Teleneurology On-call or [facility] neurohospitalist; contact information can be found on AMION  Total critical care time: 35 minutes   Critical care time was exclusive of separately billable procedures and treating other patients.   Critical care was necessary to treat or prevent imminent or life-threatening deterioration.   Critical care was time spent personally by me on the following activities: development of treatment plan with patient and/or surrogate as well as nursing, discussions with consultants/primary team, evaluation of patient's response to treatment, examination of patient, obtaining history from patient or surrogate, ordering and performing treatments and interventions, ordering and review of laboratory studies, ordering and review of radiographic studies, and re-evaluation of patient's condition as needed, as documented above.

## 2020-11-06 NOTE — ED Notes (Signed)
Pharmacy at bedside ,  [facility] med received

## 2020-11-06 NOTE — Telephone Encounter (Signed)
Noted.  I saw the initial ER reports and I'll await update. I thank all involved.

## 2020-11-06 NOTE — ED Notes (Signed)
Blue top sent to lab. 

## 2020-11-06 NOTE — ED Notes (Signed)
Advised family that the neurologist has arrived

## 2020-11-06 NOTE — Consult Note (Signed)
NAME:  Darrell Hayes, MRN:  [id number], DOB:  [birth year], LOS: 0 ADMISSION DATE:  11/20/20, CONSULTATION DATE:  2020-11-20 REFERRING MD:  Lenard Lance, MD CHIEF COMPLAINT:  Respiratory failure  History of Present Illness:  Unable to obtain history from patient. Chart reviewed for history.  80 year old male found unresponsive at 1300 for unknown period of time with left sided weakness. Last normal 1200. EMS gave narcan with no change. Arrives with family who provides DNR form. CT head with large right subdural hematoma with mass effect and herniation. NSG evaluated patient however family declined aggressive intervention  Pertinent  Medical History  Afib on warfarin, dementia, hx CVA with right hemiparesis, CAD, seizure  Significant Hospital Events: Including procedures, antibiotic start and stop dates in addition to other pertinent events   . 5/4 Unresponsive in ED. CT head with acute right subdural hematoma with mass effect and left midline shift and 6 mm leftward subfalcine herniation. Right MCA hyperdensity possibly representing thrombus vs artifact . NSG consulted. Patient's family does not wish for intubation or aggressive intervention per patient's DNR . PCCM consulted for acute hypoxemic respiratory failure  Interim History / Subjective:  Daughters at bedside. Discussed GOC  Objective   Blood pressure 138/78, pulse (!) 112, temperature 98.5 F (36.9 C), temperature source Axillary, resp. rate (!) 32, weight 93 kg, SpO2 99 %.        Intake/Output Summary (Last 24 hours) at 2020/11/20 1838 Last data filed at 20-Nov-2020 1531 Gross per 24 hour  Intake 250 ml  Output --  Net 250 ml   Filed Weights   11/20/20 1350  Weight: 93 kg    Physical Exam: General: Chronically ill-appearing, respiratory distress on NRB HENT: [place], AT, OP clear, MMM, NRB Eyes: EOMI, no scleral icterus Respiratory: Bilaterally rhonchi Cardiovascular: RRR, -M/R/G, no JVD GI: BS+, soft,  nontender Extremities:-Edema,-tenderness Neuro: Pupils equal 2 mm. No withdrawal to pain  Labs/imaging that I havepersonally reviewed  (right click and "Reselect all SmartList Selections" daily)  CT head 5/4 with acute right subdural hematoma with mass effect and left midline shift and 6 mm leftward subfalcine herniation.   Cr 1.43  Resolved Hospital Problem list     Assessment & Plan:  Acute encephalopathy secondary to large right subdural hematoma with mass effect and herniation -Family has requested no aggressive intervention. Ok with medical management -Cleviprex for BP control  Acute hypoxemic respiratory failure -On NRB -DNR  GOC -I discussed with family goals of care and expectations for management given patient's clinical course since I am unable to offer advanced airway support. His likelihood to survive this hospitalization is very poor with his multiple medical conditions. After discussion, decision was made to transition to comfort care. -Comfort Care order set ordered -Discussed plan with ED physician -Discussed visitation policy with ED charge nurse and conveyed info to family  Code Status: Comfort Disposition: No ICU needs. Admit to floor  Labs   CBC: Recent Labs  Lab Nov 20, 2020 1316  WBC 13.0*  NEUTROABS 5.0  HGB 13.3  HCT 38.6*  MCV 95.1  PLT 197    Basic Metabolic Panel: Recent Labs  Lab 11-20-2020 1316  NA 138  K 3.6  CL 104  CO2 27  GLUCOSE 157*  BUN 23  CREATININE 1.43*  CALCIUM 9.1   GFR: Estimated Creatinine Clearance: 45.6 mL/min (A) (by C-G formula based on SCr of 1.43 mg/dL (H)). Recent Labs  Lab Nov 20, 2020 1316  WBC 13.0*    Liver Function Tests:  Recent Labs  Lab 11/17/2020 1316  AST 18  ALT 13  ALKPHOS 77  BILITOT 0.8  PROT 8.0  ALBUMIN 3.7   No results for input(s): LIPASE, AMYLASE in the last 168 hours. No results for input(s): AMMONIA in the last 168 hours.  ABG    Component Value Date/Time   PHART 7.399  09/26/2008 1415   PCO2ART 39.6 09/26/2008 1415   PO2ART 106.0 (H) 09/26/2008 1415   HCO3 24.5 (H) 09/26/2008 1415   TCO2 26 09/26/2008 1415   O2SAT 98.0 09/26/2008 1415     Coagulation Profile: Recent Labs  Lab 11/22/2020 1316 11/16/2020 1517  INR 1.4* 1.1    Cardiac Enzymes: No results for input(s): CKTOTAL, CKMB, CKMBINDEX, TROPONINI in the last 168 hours.  HbA1C: Hemoglobin A1C  Date/Time Value Ref Range Status  08/05/2020 12:00 AM 7.2  Final  08/05/2020 12:00 AM 7.2  Final    CBG: Recent Labs  Lab 11/19/2020 1314  GLUCAP 143*    Review of Systems:   Unable to obtain  Past Medical History:  He,  has a past medical history of Agitation, Alveolar/parietoalveolar pneumonopathy, other, Atrial fibrillation (HCC), CAD (coronary artery disease) (09/2008), CVA (cerebral vascular accident) (HCC) (2007), Dyspnea (09/2008), Hepatitis, Hiatal hernia, History of chickenpox, Hyperlipidemia, Hypertension, Nonspecific abnormal results of pulmonary system function study, Pulmonary nodules (09/2008), and Seizures (HCC) (12/2008).   Surgical History:   Past Surgical History:  Procedure Laterality Date  . APPENDECTOMY    . IR PERC CHOLECYSTOSTOMY  12/23/2018  . Stent placed  09/2008     Social History:   reports that he quit smoking about 13 years ago. His smoking use included cigarettes and pipe. He quit after 32.00 years of use. He has never used smokeless tobacco. He reports that he does not drink alcohol and does not use drugs.   Family History:  His family history includes Arthritis in his mother; Heart disease in an other family member; Hyperlipidemia in his mother; Hypertension in his mother; Stroke in his father.   Allergies Allergies  Allergen Reactions  . Tamiflu [Oseltamivir]      Home Medications  Prior to Admission medications   Medication Sig Start Date End Date Taking? Authorizing Provider  albuterol (PROVENTIL) (2.5 MG/3ML) 0.083% nebulizer solution Take 2.5 mg by  nebulization every 4 (four) hours as needed for wheezing or shortness of breath.    [provider]  amiodarone (PACERONE) 200 MG tablet Take 1 tablet (200 mg total) by mouth daily. 09/14/19   Joaquim Nam, MD  cholecalciferol (VITAMIN D) 1000 UNITS tablet Take 1,000 Units by mouth daily.    [provider]  docusate sodium (COLACE) 100 MG capsule Take 100 mg by mouth daily.    [provider]  Ferrous Sulfate (IRON) 325 (65 Fe) MG TABS Take one tablet three times a week by mouth 08/22/20   Joaquim Nam, MD  levothyroxine (SYNTHROID) 50 MCG tablet Take 50 mcg by mouth every evening.    [provider]  metoprolol succinate (TOPROL-XL) 25 MG 24 hr tablet Take 0.5 tablets (12.5 mg total) by mouth daily. Take with or immediately following a meal. 09/14/19   Joaquim Nam, MD  Nutritional Supplements (ENSURE HIGH PROTEIN) LIQD Take 237 ml by mouth twice a day  as protein supplement 08/09/20   [provider]  nystatin (MYCOSTATIN/NYSTOP) powder Apply 1 application topically daily as needed. 09/14/19   Joaquim Nam, MD  rosuvastatin (CRESTOR) 20 MG tablet Take 1  tablet (20 mg total) by mouth daily. 04/14/11   Joaquim Nam, MD  tamsulosin (FLOMAX) 0.4 MG CAPS capsule Take 0.8 mg by mouth at bedtime.    [provider]  warfarin (COUMADIN) 2 MG tablet Take 2mg  by mouth Tues, Thurs, and Sat or as directed by coumadin clinic. 10/10/19   12/10/19, MD  warfarin (COUMADIN) 3 MG tablet Take 3 mg by mouth Mon, Wed, Fri and Sun or as directed by coumadin clinic. 10/10/19   12/10/19, MD     Critical care time: N/A    Care Time: 12 minutes  96, M.D. [facility] 11/24/2020 7:17 PM

## 2020-11-06 NOTE — ED Notes (Signed)
Dr Jessup at bedside 

## 2020-11-06 NOTE — ED Notes (Signed)
Mary, RN is transporting patient to bed 136A.  Patient family escorting.

## 2020-11-06 NOTE — Consult Note (Signed)
Referring Physician:  No referring provider defined for this encounter.  Primary Physician:  Joaquim Nam, MD  Chief Complaint:  Abnormal mental status  History of Present Illness: 11/22/2020 Darrell Hayes is a 80 y.o. male who presents with the chief complaint of being found down.  He is unable to participate in this history due to poor mental status - Level 5 qualifier.  His daughters are present, and provide history.  He lives with his daughter, who notes that he has had a stroke, but is conversational at baseline.  He normally does some things for himself, but has dementia and some memory issues.  He can turn on the TV and switch channels, but does not do errands or take care of his bills.  He was found unresponsive around 1300 today.  He was brought to ER, where stroke code was called.  CT was obtained, showing mixed density subdural fluid collection.  He is on warfarin, but was subtherapeutic on presentation.  Darrell Hayes has made his wishes clear with his family, and is DNR.  He does not wish to have major interventions, per his daughters.   Review of Systems:  Not obtainable   Past Medical History: Past Medical History:  Diagnosis Date  . Agitation    and irritability, situational exacerbation  . Alveolar/parietoalveolar pneumonopathy, other    06/14/09 CT chest, Mild LLL GGO; 02/28/10 CT chest , improved  . Atrial fibrillation (HCC)    Coumadin therapy - Dr. Jens Som.  Cardioversion 08/23/09, Amiodarone Rx since 09/03/2009  . CAD (coronary artery disease) 09/2008   Dr. Jens Som, stent to LAD  . CVA (cerebral vascular accident) (HCC) 2007   In the setting of atrial fibrillation  . Dyspnea 09/2008   Class 3, since 2008.  Significant tachycardia.  No PE or fibrosis on CT chest 3/10, 12/10, 02/2010, normal PFT but isolated low DLCO  06/2009, normal hgb, TSH, creat and albumin , Dec 2010.  Resolved after A Fib cardioversion and Amiodarone Marh 2011  . Hepatitis   .  Hiatal hernia   . History of chickenpox   . Hyperlipidemia   . Hypertension   . Nonspecific abnormal results of pulmonary system function study   . Pulmonary nodules 09/2008   Nonspecific reticulonodular nodules RLL superior segment, resolved on CT 06/14/09  . Seizures (HCC) 12/2008   Dr. Sharene Skeans    Past Surgical History: Past Surgical History:  Procedure Laterality Date  . APPENDECTOMY    . IR PERC CHOLECYSTOSTOMY  12/23/2018  . Stent placed  09/2008    Allergies: Allergies as of 12/03/2020 - Review Complete 11/14/2020  Allergen Reaction Noted  . Tamiflu [oseltamivir]  10/15/2019    Medications:  Current Facility-Administered Medications:  .  clevidipine (CLEVIPREX) infusion 0.5 mg/mL, 0-21 mg/hr, Intravenous, Continuous, Bhagat, Srishti L, MD, Last Rate: 12 mL/hr at 11/29/2020 1653, 6 mg/hr at 11/13/2020 1653  Current Outpatient Medications:  .  albuterol (PROVENTIL) (2.5 MG/3ML) 0.083% nebulizer solution, Take 2.5 mg by nebulization every 4 (four) hours as needed for wheezing or shortness of breath., Disp: , Rfl:  .  amiodarone (PACERONE) 200 MG tablet, Take 1 tablet (200 mg total) by mouth daily., Disp: 30 tablet, Rfl: 0 .  cholecalciferol (VITAMIN D) 1000 UNITS tablet, Take 1,000 Units by mouth daily., Disp: , Rfl:  .  docusate sodium (COLACE) 100 MG capsule, Take 100 mg by mouth daily., Disp: , Rfl:  .  Ferrous Sulfate (IRON) 325 (65 Fe) MG TABS, Take one tablet  three times a week by mouth, Disp: 30 tablet, Rfl: 0 .  levothyroxine (SYNTHROID) 50 MCG tablet, Take 50 mcg by mouth every evening., Disp: , Rfl:  .  metoprolol succinate (TOPROL-XL) 25 MG 24 hr tablet, Take 0.5 tablets (12.5 mg total) by mouth daily. Take with or immediately following a meal., Disp:  , Rfl:  .  Nutritional Supplements (ENSURE HIGH PROTEIN) LIQD, Take 237 ml by mouth twice a day  as protein supplement, Disp: , Rfl:  .  nystatin (MYCOSTATIN/NYSTOP) powder, Apply 1 application topically daily as needed.,  Disp: , Rfl:  .  rosuvastatin (CRESTOR) 20 MG tablet, Take 1 tablet (20 mg total) by mouth daily., Disp: 90 tablet, Rfl: 3 .  tamsulosin (FLOMAX) 0.4 MG CAPS capsule, Take 0.8 mg by mouth at bedtime., Disp: , Rfl:  .  warfarin (COUMADIN) 2 MG tablet, Take 2mg  by mouth Tues, Thurs, and Sat or as directed by coumadin clinic., Disp: 30 tablet, Rfl: 1 .  warfarin (COUMADIN) 3 MG tablet, Take 3 mg by mouth Mon, Wed, Fri and Sun or as directed by coumadin clinic., Disp: 30 tablet, Rfl: 1   Social History: Social History   Tobacco Use  . Smoking status: Former Smoker    Years: 32.00    Types: Cigarettes, Pipe    Quit date: 11/27/2006    Years since quitting: 13.9  . Smokeless tobacco: Never Used  Vaping Use  . Vaping Use: Never used  Substance Use Topics  . Alcohol use: No  . Drug use: Never    Family Medical History: Family History  Problem Relation Age of Onset  . Hyperlipidemia Mother   . Hypertension Mother   . Arthritis Mother        osteoarthritis  . Stroke Father   . Heart disease Other        Family history of heart disease    Physical Examination: Vitals:   11/05/2020 1630 11/21/2020 1700  BP: 127/66 (!) 110/59  Pulse: 100 98  Resp: (!) 33 (!) 30  Temp:    SpO2: 100% 100%     General: Patient is elderly and nonresponsive.  Psychiatric: Patient is non-anxious.  Head:  Pupils equal, round, and reactive to light.  ENT:  Oral mucosa appears well hydrated.  Neck:   Supple.  Limited ROM  Respiratory: Patient is breathing with significant diffiulty.  Extremities: No edema.  Vascular: Palpable pulses in dorsal pedal vessels.  Skin:   On exposed skin, there are some mild bruises consistent with warfarin use.   NEUROLOGICAL:  General: In no acute distress.    No OE, Does not regard or follow commands.  No word production.  He moves to stimulation, but does not posture or localize to pain.  P 3 to 2 B.  Appears to gaze to R, but will not move to L.  Corneals  intact.  No cough.    He has a R flexion contracture of RUE.    Imaging: CT Head 11/19/2020 IMPRESSION: 1. Acute right cerebral convexity subdural hematoma, measuring up to 2.3 cm in thickness. Resulting mass effect with approximately 3 mm of leftward midline shift at the foramen of [place] and approximately 6 mm of leftward subfalcine herniation. Heterogeneous/swirling appearance of the subdural hemorrhage is concerning for hyperacute hemorrhage. 2. Hyperdense appearance of the right MCA, which could represent thrombus or artifact. CTA could further evaluate. 3. Large remote left MCA territory infarct.  Code stroke imaging results were communicated on 11/16/2020 at 1:44 pm  to provider Dr. Larinda Buttery via telephone, who verbally acknowledged these results.   Electronically Signed   By: Feliberto Harts MD   On: 11-30-20 13:49  I have personally reviewed the images and agree with the above interpretation.  Labs: CBC Latest Ref Rng & Units 11/30/2020 08/05/2020 08/05/2020  WBC 4.0 - 10.5 K/uL 13.0(H) 4.4 4.4  Hemoglobin 13.0 - 17.0 g/dL 73.7 11.6(A) 11.6(A)  Hematocrit 39.0 - 52.0 % 38.6(L) 33(A) -  Platelets 150 - 400 K/uL 197 215 215       Assessment and Plan: Darrell Hayes is an 80 y.o. male with mixed density subdural hematoma on warfarin.  He has been reversed.  He has current GCS of E1V1M4 =6.  I am very concerned about his breathing.    He will have a repeat head CT in 2 hours.  He is not a candidate for craniotomy given his underlying dementia and medical comorbidities, and his expressed desire not to have interventional procedures such as intubation or surgery.  I am very concerned that he will not survive this. I expressed that to his daughters.  If he makes it through the acute period, he may be a candidate for a burr hole or MMA embolization.  That remains to be seen.  Would consider ICU admission for CPAP or BiPAP.    Darrell Hayes K. Myer Haff MD, MPHS Dept. of  Neurosurgery

## 2020-11-06 NOTE — ED Triage Notes (Addendum)
Pt comes ems from home. Last seen alert at 1200 then 1215 was unresponsive. Snoring respirations. Narcan given with no change. 18G L AC. Last BP with EMS 144/87. Pt is DNR and is present upon arrival. Pt wishes confirmed with family and dr Larinda Buttery.

## 2020-11-06 NOTE — ED Notes (Addendum)
Leave Clevirpex at rate of 6mg /hr during comfort care , Per MD. 

## 2020-11-06 NOTE — ED Notes (Signed)
Pt changed , male purewick  & Mepilex placed.   Bed sheets changed .

## 2020-11-06 NOTE — ED Notes (Signed)
Taken to CT.

## 2020-11-06 NOTE — ED Notes (Signed)
ED Provider at bedside. 

## 2020-11-06 NOTE — ED Provider Notes (Signed)
-----------------------------------------   6:40 PM on 11/24/2020 -----------------------------------------  Patient care assumed from Dr. Larinda Buttery.  Dr. Marcell Barlow has been down to speak to the patient's family.  Dr. Everardo All of ICU has been down to speak to the patient's family and I have also spoken to the patient's family.  Ultimately the family has agreed the best course forward would be comfort care measures.  We will admit to the hospitalist service for further treatment and comfort measures.   Minna Antis, MD 11/08/2020 [phone]

## 2020-11-06 NOTE — ED Notes (Signed)
Neurology at bedside.

## 2020-11-06 NOTE — ED Notes (Signed)
Code stroke called per Dr Larinda Buttery

## 2020-11-06 NOTE — ED Notes (Signed)
ICU staff at bedside

## 2020-11-06 NOTE — ED Notes (Signed)
Family at bedside. 

## 2020-11-06 NOTE — ED Notes (Signed)
MD Larinda Buttery speaking with son

## 2020-11-07 DIAGNOSIS — I4891 Unspecified atrial fibrillation: Secondary | ICD-10-CM

## 2020-11-07 DIAGNOSIS — S065X9A Traumatic subdural hemorrhage with loss of consciousness of unspecified duration, initial encounter: Secondary | ICD-10-CM

## 2020-11-07 DIAGNOSIS — Z515 Encounter for palliative care: Principal | ICD-10-CM

## 2020-11-07 DIAGNOSIS — I6200 Nontraumatic subdural hemorrhage, unspecified: Secondary | ICD-10-CM

## 2020-11-07 DIAGNOSIS — E1159 Type 2 diabetes mellitus with other circulatory complications: Secondary | ICD-10-CM

## 2020-11-07 DIAGNOSIS — Z7189 Other specified counseling: Secondary | ICD-10-CM

## 2020-11-07 MED ORDER — GLYCOPYRROLATE 0.2 MG/ML IJ SOLN
0.4000 mg | INTRAMUSCULAR | Status: DC
  Administered 2020-11-07: 0.4 mg via INTRAVENOUS
  Filled 2020-11-07 (×5): qty 2

## 2020-11-07 MED ORDER — HALOPERIDOL 0.5 MG PO TABS
0.5000 mg | ORAL_TABLET | ORAL | Status: DC | PRN
  Filled 2020-11-07: qty 1

## 2020-11-07 MED ORDER — SODIUM CHLORIDE 0.9 % IV SOLN
12.5000 mg | Freq: Four times a day (QID) | INTRAVENOUS | Status: DC | PRN
  Filled 2020-11-07: qty 0.5

## 2020-11-07 MED ORDER — GLYCOPYRROLATE 0.2 MG/ML IJ SOLN: 0.4000 mg | INTRAMUSCULAR | Status: DC

## 2020-11-07 MED ORDER — HALOPERIDOL LACTATE 5 MG/ML IJ SOLN: 0.5000 mg | INTRAMUSCULAR | Status: DC | PRN

## 2020-11-07 MED ORDER — LORAZEPAM 2 MG/ML PO CONC: 1.0000 mg | ORAL | Status: DC | PRN

## 2020-11-07 MED ORDER — NYSTATIN 100000 UNIT/GM EX POWD
Freq: Three times a day (TID) | CUTANEOUS | Status: DC | PRN
  Filled 2020-11-07: qty 15

## 2020-11-07 MED ORDER — LORAZEPAM 1 MG PO TABS: 1.0000 mg | ORAL_TABLET | ORAL | Status: DC | PRN

## 2020-11-07 MED ORDER — HALOPERIDOL LACTATE 2 MG/ML PO CONC
0.5000 mg | ORAL | Status: DC | PRN
  Filled 2020-11-07: qty 0.3

## 2020-11-07 MED ORDER — LORAZEPAM 2 MG/ML IJ SOLN
1.0000 mg | INTRAMUSCULAR | Status: DC | PRN
Start: 2020-11-07 — End: 2020-11-07

## 2020-11-07 MED ORDER — OXYBUTYNIN CHLORIDE 5 MG PO TABS
2.5000 mg | ORAL_TABLET | Freq: Four times a day (QID) | ORAL | Status: DC | PRN
  Filled 2020-11-07: qty 0.5

## 2020-11-07 MED ORDER — LOPERAMIDE HCL 2 MG PO CAPS: 2.0000 mg | ORAL_CAPSULE | ORAL | Status: DC | PRN

## 2020-11-07 MED ORDER — BIOTENE DRY MOUTH MT LIQD: 15.0000 mL | OROMUCOSAL | Status: DC | PRN

## 2020-11-07 MED ORDER — MORPHINE BOLUS VIA INFUSION
4.0000 mg | INTRAVENOUS | Status: DC | PRN
Start: 2020-11-07 — End: 2020-11-07
  Filled 2020-11-07: qty 4

## 2020-11-07 MED ORDER — MAGIC MOUTHWASH W/LIDOCAINE
15.0000 mL | Freq: Four times a day (QID) | ORAL | Status: DC | PRN
  Filled 2020-11-07: qty 15

## 2020-11-07 MED ORDER — BISACODYL 10 MG RE SUPP: 10.0000 mg | Freq: Every day | RECTAL | Status: DC | PRN

## 2020-11-08 NOTE — Telephone Encounter (Signed)
daughter called to pronounced that he has passed away yesterday.

## 2020-11-10 NOTE — Telephone Encounter (Signed)
Noted.  I called his daughter on 11/08/2020.  Condolences offered.  She thanked me for the call.  I appreciate the help of all involved.

## 2020-11-28 ENCOUNTER — Ambulatory Visit: Payer: Medicare (Managed Care) | Admitting: Dermatology

## 2020-11-28 ENCOUNTER — Ambulatory Visit: Payer: Medicare (Managed Care) | Admitting: Family Medicine

## 2020-11-28 ENCOUNTER — Ambulatory Visit: Payer: Medicare (Managed Care)

## 2020-12-04 NOTE — Consult Note (Signed)
Consultation Note Date: 2020-11-14   Patient Name: Darrell Hayes  DOB: February 11, 1941  MRN: [id number]  Age / Sex: 80 y.o., male   PCP: Tonia Ghent, MD Referring Physician: Eugenie Filler, MD   REASON FOR CONSULTATION:Establishing goals of care  Palliative Care consult requested for goals of care discussion in this 80 y.o. male with a medical history significant for atrial fibrillation (Coumadin), CVA, hyperlipidemia, seizures, hypertension, GERD, obesity.  Patient presented to the ED via EMS from home after caregiver found patient unresponsive.  Patient evaluated by critical care and neurosurgery.  Head CT showed acute right cerebral subdural hematoma.  3 mm leftward midline shift and 6 mm herniation.  Stable other abnormalities.  Family declined further work-up with request to focus on patient's comfort and end-of-life care.  Clinical Assessment and Goals of Care: I have reviewed medical records including lab results, imaging, Epic notes, and MAR, received report from the bedside RN, and assessed the patient.   I met at the bedside with patient's sister, son, and hired caregiver to discuss diagnosis prognosis, [place], EOL wishes, disposition and options.  Patient is unresponsive with increased work of breathing.  I introduced Palliative Medicine as specialized medical care for people living with serious illness. It focuses on providing relief from the symptoms and stress of a serious illness. The goal is to improve quality of life for both the patient and the family.  Raquel Sarna verbalized understanding and appreciation.  We discussed a brief life review of the patient, along with his functional and nutritional status.  Son reports patient is divorced.  He has 3 children.  Prior to admission patient lived in the home with hired caregivers.  Caregiver at the bedside reporting patient was normal and sitting at table eating a banana.  She returned from washing dishes and found banana on the  floor and patient unresponsive with fixed gaze.  We discussed His current illness and what it means in the larger context of His on-going co-morbidities. Natural disease trajectory and expectations at EOL were discussed.  Family verbalizes understanding of patient's current illness and prognosis.  They are clear and expressed wishes to focus on his comfort and end-of-life.  Family confirms DNR/DNI.  Education provided on comfort care and what this would look like while hospitalized.  We discussed symptom management in the setting of increased work of breathing and audible congestion.   Education provided on expectations at end of life.  Family verbalizes understanding and awareness of anticipated hospital death.   I discussed the importance of continued conversation with family and their medical providers regarding overall plan of care and treatment options, ensuring decisions are within the context of the patients values and GOCs.  Questions and concerns were addressed. The family was encouraged to call with questions or concerns.  PMT will continue to support holistically as needed.   CODE STATUS: DNR  ADVANCE DIRECTIVES: Primary Decision Maker: Son  SYMPTOM MANAGEMENT: See below  Palliative Prophylaxis:   Aspiration, Eye Care, Frequent Pain Assessment, Oral Care and Turn Reposition  PSYCHO-SOCIAL/SPIRITUAL:  Support System: Family  Desire for further Chaplaincy support: Yes  Additional Recommendations (Limitations, Scope, Preferences):  Full Comfort Care  Education on hospice/palliative    PAST MEDICAL HISTORY: Past Medical History:  Diagnosis Date  . Agitation    and irritability, situational exacerbation  . Alveolar/parietoalveolar pneumonopathy, other    06/14/09 CT chest, Mild LLL GGO; 02/28/10 CT chest , improved  . Atrial fibrillation ([place])  Coumadin therapy - Dr. Stanford Breed.  Cardioversion 08/23/09, Amiodarone Rx since 09/03/2009  . CAD (coronary artery disease)  09/2008   Dr. Stanford Breed, stent to LAD  . CVA (cerebral vascular accident) ([place]) 2007   In the setting of atrial fibrillation  . Dyspnea 09/2008   Class 3, since 2008.  Significant tachycardia.  No PE or fibrosis on CT chest 3/10, 12/10, 02/2010, normal PFT but isolated low DLCO  06/2009, normal hgb, TSH, creat and albumin , Dec 2010.  Resolved after A Fib cardioversion and Amiodarone [place] 2011  . Hepatitis   . Hiatal hernia   . History of chickenpox   . Hyperlipidemia   . Hypertension   . Nonspecific abnormal results of pulmonary system function study   . Pulmonary nodules 09/2008   Nonspecific reticulonodular nodules RLL superior segment, resolved on CT 06/14/09  . Seizures ([place]) 12/2008   Dr. Gaynell Face    ALLERGIES:  is allergic to tamiflu [oseltamivir].   MEDICATIONS:  Current Facility-Administered Medications  Medication Dose Route Frequency Provider Last Rate Last Admin  . antiseptic oral rinse (BIOTENE) solution 15 mL  15 mL Topical PRN Eugenie Filler, MD      . bisacodyl (DULCOLAX) suppository 10 mg  10 mg Rectal Daily PRN Eugenie Filler, MD      . chlorproMAZINE (THORAZINE) 12.5 mg in sodium chloride 0.9 % 25 mL IVPB  12.5 mg Intravenous Q6H PRN Eugenie Filler, MD      . diphenhydrAMINE (BENADRYL) injection 25 mg  25 mg Intravenous Q4H PRN Margaretha Seeds, MD      . glycopyrrolate (ROBINUL) injection 0.4 mg  0.4 mg Intravenous Q4H Pickenpack-Cousar, Urie Loughner N, NP      . haloperidol lactate (HALDOL) injection 0.5 mg  0.5 mg Intravenous Q4H PRN Eugenie Filler, MD      . LORazepam (ATIVAN) injection 1 mg  1 mg Intravenous Q4H PRN Eugenie Filler, MD      . magic mouthwash w/lidocaine  15 mL Oral Q6H PRN Eugenie Filler, MD      . morphine 164m in NS 1020m(63m43mL) infusion - premix  0-20 mg/hr Intravenous Continuous GarElwyn ReachD 10 mL/hr at 11/08/01/2211 10 mg/hr at 05/June 03, 202211  . morphine bolus via infusion 4 mg  4 mg Intravenous PRN  Pickenpack-Cousar, Dasiah Hooley N, NP      . nystatin (MYCOSTATIN/NYSTOP) topical powder   Topical TID PRN ThoEugenie FillerD      . ondansetron (ZOSt. Jude Children'S Research Hospitalnjection 4 mg  4 mg Intravenous Q6H PRN GarElwyn ReachD      . polyvinyl alcohol (LIQUIFILM TEARS) 1.4 % ophthalmic solution 1 drop  1 drop Both Eyes QID PRN EllMargaretha SeedsD      . scopolamine (TRANSDERM-SCOP) 1 MG/3DAYS 1.5 mg  1 patch Transdermal Q72H GarGala Romney MD   1.5 mg at 11/14/2020 2136    VITAL SIGNS: BP 131/74 (BP Location: Left Arm)   Pulse 95   Temp 98.3 F (36.8 C)   Resp 16   Wt 93 kg   SpO2 95%   BMI 31.17 kg/m  Filed Weights   11/22/2020 1350  Weight: 93 kg    Estimated body mass index is 31.17 kg/m as calculated from the following:   Height as of 08/22/20: '5\' 8"'  (1.727 m).   Weight as of this encounter: 93 kg.  LABS: CBC:    Component Value Date/Time   WBC 13.0 (H) 11/28/2020 1316  HGB 13.3 11/27/2020 1316   HGB 14.2 09/22/2013 2218   HCT 38.6 (L) 11/16/2020 1316   HCT 42.0 09/22/2013 2218   PLT 197 11/12/2020 1316   PLT 204 09/22/2013 2218   Comprehensive Metabolic Panel:    Component Value Date/Time   NA 140 11/15/2020 1814   NA 137 09/22/2013 2218   K 3.6 11/24/2020 1316   K 4.2 09/22/2013 2218   BUN 23 11/11/2020 1316   BUN 12 09/22/2013 2218   CREATININE 1.43 (H) 11/17/2020 1316   CREATININE 1.21 09/22/2013 2218   ALBUMIN 3.7 11/05/2020 1316   ALBUMIN 3.5 09/22/2013 2218     Review of Systems  Unable to perform ROS: Patient unresponsive   Physical Exam General: Respiratory distress, frail ill appearing Cardiovascular: Tachycardic Pulmonary: Audible congestion, rhonchi Abdomen: soft, nontender, + bowel sounds Neurological: Unresponsive   Prognosis: Hours - Days  Discharge Planning:  Anticipated Hospital Death  Recommendations: . DNR/DNI-as confirmed by family . All care to focus on comfort/end-of-life  . Discussed at length with family end-of-life  expectations, symptoms, medication management.  Family verbalizes understanding in addition to anticipated hospital death. Continue Morphine drip with boluses available PRN for pain/air hunger/comfort Robinul scheduled for excessive secretions Ativan PRN for agitation/anxiety Scopolamine patch for secretions Zofran PRN for nausea Liquifilm tears PRN for dry eyes Haldol PRN for agitation/anxiety Comfort cart for family Unrestricted visitations in the setting of EOL (per policy) Oxygen PRN 2L or less for comfort. No escalation. Removed NRB at the bedside and placed on nasal cannula in setting of comfort . RN to provide nasal suctioning as needed  . PMT will continue to support and follow as needed. Please call team line with urgent needs.   Palliative Performance Scale:PPS 10%                Family expressed understanding and was in agreement with this plan.   Thank you for allowing the Palliative Medicine Team to assist in the care of this patient. Please utilize secure chat with additional questions, if there is no response within 30 minutes please call the above phone number.   Time In: 1030  Time Out: 1120 Time Total: 50 min.   Visit consisted of counseling and education dealing with the complex and emotionally intense issues of symptom management and palliative care in the setting of serious and potentially life-threatening illness.Greater than 50%  of this time was spent counseling and coordinating care related to the above assessment and plan.  Signed by:  Alda Lea, AGPCNP-BC Palliative Medicine Team  Phone: [phone] Pager: [phone] Amion: [place] Team providers are available by phone from 7am to 7pm daily and can be reached through the team cell phone.  Should this patient require assistance outside of these hours, please call the patient's attending physician.

## 2020-12-04 NOTE — Death Summary Note (Signed)
DEATH SUMMARY   Patient Details  Name: Darrell Hayes MRN: [id number] DOB: 06-28-1941  Admission/Discharge Information   Admit Date:  18-Nov-2020  Date of Death: Date of Death: 11/19/20  Time of Death: Time of Death: October 25, 1653  Length of Stay: 1  Referring Physician: Joaquim Nam, MD   Reason(s) for Hospitalization  Acute metabolic encephalopathy secondary to large subdural hematoma  Diagnoses  Preliminary cause of death:  Secondary Diagnoses (including complications and co-morbidities):  Principal Problem:   Subdural hematoma (HCC) Active Problems:   Coronary atherosclerosis   ATRIAL FIBRILLATION   Type 2 diabetes mellitus with vascular disease [facility])   Brief Hospital Course (including significant findings, care, treatment, and services provided and events leading to death)  Darrell Hayes is a 80 y.o. year old male who had a history of A. fib on chronic anticoagulation with Coumadin, history of CVA, hypertension, hyperlipidemia, seizure disorder, GERD, morbid obesity who was doing fine and per admitting physician's note sustained a fall in the bathroom however per caregiver patient did not have a fall but was eating a banana and she subsequently noted he dropped a banana and subsequently became unresponsive.  Patient brought to the ED and noted to have a significant subdural hematoma with evidence of early herniation, increased intracranial pressure.  Patient was evaluated by critical care, neurology, and neurosurgery.  Case discussed with the family at length and ultimately family decided to pursue comfort measures. Patient admitted for comfort measures and end-of-life care. Palliative care consulted and patient seen in consultation by Mayra Reel, NP and patient placed on the full comfort measures with symptom management. Patient was kept comfortable and patient subsequently died at 1655 hrs. on 11/2020/06/01.  May his soul rest in peace.    Pertinent Labs and Studies   Significant Diagnostic Studies CT HEAD CODE STROKE WO CONTRAST  Result Date: 11/18/20 CLINICAL DATA:  Code stroke.  Neuro deficit suspected. EXAM: CT HEAD WITHOUT CONTRAST TECHNIQUE: Contiguous axial images were obtained from the base of the skull through the vertex without intravenous contrast. COMPARISON:  None. FINDINGS: Brain: Acute right cerebral convexity subdural hematoma, measuring up to 2.3 cm in thickness. Resulting mass effect with approximately 3 mm of leftward midline shift at the foramina of [place] and 6 mm of leftward subfalcine herniation. Mass effect on the right lateral ventricle, which is effaced. Basal cisterns are patent. No evidence of hydrocephalus. Similar large area of encephalomalacia involving the left temporal, parietooccipital lobes, posterior left MCA territory. No evidence of acute large vascular territory infarct. Vascular: Hyperdense appearance of the right MCA. Skull: No acute fracture. Sinuses/Orbits: Mild mucosal thickening. Other: No mastoid effusions. IMPRESSION: 1. Acute right cerebral convexity subdural hematoma, measuring up to 2.3 cm in thickness. Resulting mass effect with approximately 3 mm of leftward midline shift at the foramen of [place] and approximately 6 mm of leftward subfalcine herniation. Heterogeneous/swirling appearance of the subdural hemorrhage is concerning for hyperacute hemorrhage. 2. Hyperdense appearance of the right MCA, which could represent thrombus or artifact. CTA could further evaluate. 3. Large remote left MCA territory infarct. Code stroke imaging results were communicated on Nov 18, 2020 at 1:44 pm to provider Dr. Larinda Buttery via telephone, who verbally acknowledged these results. Electronically Signed   By: Feliberto Harts MD   On: 11-18-20 13:49    Microbiology Recent Results (from the past 240 hour(s))  Resp Panel by RT-PCR (Flu A&B, Covid) Nasopharyngeal Swab     Status: None   Collection Time: Nov 18, 2020  1:10 PM  Specimen:  Nasopharyngeal Swab; Nasopharyngeal(NP) swabs in vial transport medium  Result Value Ref Range Status   SARS Coronavirus 2 by RT PCR NEGATIVE NEGATIVE Final    Comment: (NOTE) SARS-CoV-2 target nucleic acids are NOT DETECTED.  The SARS-CoV-2 RNA is generally detectable in upper respiratory specimens during the acute phase of infection. The lowest concentration of SARS-CoV-2 viral copies this assay can detect is 138 copies/mL. A negative result does not preclude SARS-Cov-2 infection and should not be used as the sole basis for treatment or other patient management decisions. A negative result may occur with  improper specimen collection/handling, submission of specimen other than nasopharyngeal swab, presence of viral mutation(s) within the areas targeted by this assay, and inadequate number of viral copies(<138 copies/mL). A negative result must be combined with clinical observations, patient history, and epidemiological information. The expected result is Negative.  Fact Sheet for Patients:  BloggerCourse.com  Fact Sheet for Healthcare Providers:  SeriousBroker.it  This test is no t yet approved or cleared by the [place] FDA and  has been authorized for detection and/or diagnosis of SARS-CoV-2 by FDA under an Emergency Use Authorization (EUA). This EUA will remain  in effect (meaning this test can be used) for the duration of the COVID-19 declaration under Section 564(b)(1) of the Act, 21 U.S.C.section 360bbb-3(b)(1), unless the authorization is terminated  or revoked sooner.       Influenza A by PCR NEGATIVE NEGATIVE Final   Influenza B by PCR NEGATIVE NEGATIVE Final    Comment: (NOTE) The Xpert Xpress SARS-CoV-2/FLU/RSV plus assay is intended as an aid in the diagnosis of influenza from Nasopharyngeal swab specimens and should not be used as a sole basis for treatment. Nasal washings and aspirates are unacceptable for  Xpert Xpress SARS-CoV-2/FLU/RSV testing.  Fact Sheet for Patients: BloggerCourse.com  Fact Sheet for Healthcare Providers: SeriousBroker.it  This test is not yet approved or cleared by the [place] FDA and has been authorized for detection and/or diagnosis of SARS-CoV-2 by FDA under an Emergency Use Authorization (EUA). This EUA will remain in effect (meaning this test can be used) for the duration of the COVID-19 declaration under Section 564(b)(1) of the Act, 21 U.S.C. section 360bbb-3(b)(1), unless the authorization is terminated or revoked.  Performed at [facility], [address], [place], [place] [address]     Lab Basic Metabolic Panel: Recent Labs  Lab 11/19/2020 1316 11/21/2020 1814  NA 138 140  K 3.6  --   CL 104  --   CO2 27  --   GLUCOSE 157*  --   BUN 23  --   CREATININE 1.43*  --   CALCIUM 9.1  --    Liver Function Tests: Recent Labs  Lab 11/09/2020 1316  AST 18  ALT 13  ALKPHOS 77  BILITOT 0.8  PROT 8.0  ALBUMIN 3.7   No results for input(s): LIPASE, AMYLASE in the last 168 hours. No results for input(s): AMMONIA in the last 168 hours. CBC: Recent Labs  Lab 11/12/2020 1316  WBC 13.0*  NEUTROABS 5.0  HGB 13.3  HCT 38.6*  MCV 95.1  PLT 197   Cardiac Enzymes: No results for input(s): CKTOTAL, CKMB, CKMBINDEX, TROPONINI in the last 168 hours. Sepsis Labs: Recent Labs  Lab 11/29/2020 1316  WBC 13.0*    Procedures/Operations  CT head 11/29/2020   Ramiro Harvest 12/06/20, 5:12 PM

## 2020-12-04 NOTE — TOC Initial Note (Signed)
Transition of Care [facility]) - Initial/Assessment Note    Patient Details  Name: Darrell Hayes MRN: [id number] Date of Birth: 1941-04-27  [facility] CM/SW Contact:    Pete Pelt, RN Phone Number: 2020-11-27, 2:07 PM  Clinical Narrative:       TOC met with son.  Son states he and his sisters are aware of comfort care and are amenable.  State family plans to stay with patient to await provider visit today for further discussion and request TOC to return following this.  TOC contact information given.                    Patient Goals and CMS Choice        Expected Discharge Plan and Services                                                Prior Living Arrangements/Services                       Activities of Daily Living      Permission Sought/Granted                  Emotional Assessment              Admission diagnosis:  Subdural hemorrhage ([place]) [I62.00] Subdural hematoma ([place]) [S06.5X9A] Patient Active Problem List   Diagnosis Date Noted  . Subdural hematoma ([place]) 11/03/2020  . Cough 08/25/2020  . Skin lesion 06/26/2020  . Skin irritation 09/17/2019  . Long term (current) use of anticoagulants 07/13/2019  . Type 2 diabetes mellitus with vascular disease ([place]) 06/19/2019  . RUQ abdominal pain   . Acute cholecystitis   . Altered mental status 12/23/2010  . BACK PAIN 09/15/2010  . PRESSURE ULCER BUTTOCK 07/31/2010  . CEREBROVASCULAR DISEASE 06/12/2010  . DEPRESSION 04/08/2010  . HEPATITIS 04/08/2010  . SEIZURE DISORDER 04/08/2010  . Personal history of unspecified circulatory disease 04/08/2010  . Recurrent UTI 04/08/2010  . OTHER AND UNSPECIFIED HYPERLIPIDEMIA 06/14/2009  . UNSPEC ALVEOLAR&PARIETOALVEOLAR PNEUMONOPATHY 06/14/2009  . ABNORMAL PULMONARY TEST RESULTS 06/14/2009  . PULMONARY NODULE 05/28/2009  . HYPOPOTASSEMIA 05/08/2009  . Coronary atherosclerosis 04/30/2009  . COMPUTERIZED TOMOGRAPHY, CHEST,  ABNORMAL 04/30/2009  . CHEST PAIN 09/25/2008  . HYPERCHOLESTEROLEMIA, PURE 09/11/2008  . Shortness of breath 09/11/2008  . Essential hypertension 09/10/2008  . ATRIAL FIBRILLATION 09/10/2008  . Stroke ([place]) 09/10/2008   PCP:  Tonia Ghent, MD Pharmacy:   [facility] [address] [place] [address] [place] [address] Phone: [phone] Fax: [phone]     Social Determinants of Health (SDOH) Interventions    Readmission Risk Interventions No flowsheet data found.

## 2020-12-04 NOTE — Progress Notes (Signed)
   12/01/2020 1050  Clinical Encounter Type  Visited With Patient and family together  Visit Type Initial;Spiritual support  Referral From Nurse  Consult/Referral To Chaplain  Spiritual Encounters  Spiritual Needs Prayer;Emotional  Chaplain Neva Ramaswamy provided emotional and spiritual support to Pt in room 1A-136A. Pt's son and caregiver are by his bedside. Prayer was given and received at the end of the visit.

## 2020-12-04 NOTE — Progress Notes (Signed)
Pt expired at 1655, family at bedside, chaplain called. MD notified at this time.

## 2020-12-04 NOTE — Plan of Care (Signed)

## 2020-12-04 DEATH — deceased
